# Patient Record
Sex: Female | Born: 1985 | Race: Black or African American | Hispanic: No | Marital: Single | Smoking: Current every day smoker
Health system: Southern US, Community
[De-identification: ages and names within clinical notes are randomized; demographics above are authoritative.]

## PROBLEM LIST (undated history)

## (undated) ENCOUNTER — Inpatient Hospital Stay: Payer: Self-pay

## (undated) DIAGNOSIS — C50919 Malignant neoplasm of unspecified site of unspecified female breast: Secondary | ICD-10-CM

## (undated) DIAGNOSIS — D649 Anemia, unspecified: Secondary | ICD-10-CM

## (undated) HISTORY — DX: Malignant neoplasm of unspecified site of unspecified female breast: C50.919

---

## 2005-04-12 ENCOUNTER — Emergency Department: Payer: Self-pay | Admitting: Emergency Medicine

## 2005-05-06 ENCOUNTER — Emergency Department: Payer: Self-pay | Admitting: Emergency Medicine

## 2008-04-09 ENCOUNTER — Ambulatory Visit: Payer: Self-pay | Admitting: Family Medicine

## 2008-09-18 ENCOUNTER — Emergency Department: Payer: Self-pay | Admitting: Emergency Medicine

## 2010-10-05 ENCOUNTER — Observation Stay: Payer: Self-pay

## 2010-10-10 ENCOUNTER — Ambulatory Visit: Payer: Self-pay | Admitting: Obstetrics and Gynecology

## 2010-10-11 ENCOUNTER — Inpatient Hospital Stay: Payer: Self-pay | Admitting: Obstetrics and Gynecology

## 2012-12-20 ENCOUNTER — Emergency Department: Payer: Self-pay | Admitting: Emergency Medicine

## 2014-05-20 ENCOUNTER — Ambulatory Visit: Payer: Self-pay | Admitting: Obstetrics and Gynecology

## 2014-05-21 ENCOUNTER — Inpatient Hospital Stay: Payer: Self-pay | Admitting: Obstetrics and Gynecology

## 2014-07-05 NOTE — Op Note (Signed)
PATIENT NAME:  [name], [name] MR#:  [id number] DATE OF BIRTH:  27-Aug-1985  DATE OF PROCEDURE:  05/21/2014  PREOPERATIVE DIAGNOSES:  1.  Intrauterine pregnancy at 22 weeks' gestational age.  2.  History of prior cesarean delivery, desires repeat.   POSTOPERATIVE DIAGNOSES:  1.  Intrauterine pregnancy at 4 weeks' gestational age.  2.  History of prior cesarean delivery, desires repeat.   PROCEDURE: Repeat low transverse cesarean section via Pfannenstiel incision.   ANESTHESIA: Spinal.   SURGEON: Will Bonnet, MD   ASSISTANT: Aletha Halim, MD   ESTIMATED BLOOD LOSS: 500 mL.   OPERATIVE FLUIDS: 1500 mL of crystalloid.   COMPLICATIONS: None.   FINDINGS: Adhesions of anterior abdominal wall to uterus and omentum (mild).   SPECIMENS: None.  CONDITION AT THE END OF THE PROCEDURE: Stable.   PROCEDURE IN DETAIL: The patient was taken to the operating room where spinal analgesia was administered and found to be adequate. The patient was placed in the dorsal supine position with a leftward tilt and prepped and draped in the usual sterile fashion. After a timeout was called, a Pfannenstiel incision was made and carried through the various layers until the peritoneum was identified and entered sharply. Upon opening the peritoneum, it was noted that there were adhesions of the left side of the uterus to the anterior abdominal wall along with mild adhesions of the omentum. The adhesions were taken down without difficulty. A low transverse hysterotomy was made with a scalpel and the hysterotomy was extended with cranial and caudal tension. The membranes were ruptured with a return of clear fluid. The fetal vertex was grasped and elevated to the hysterotomy. The head followed by the shoulders and the rest of the body were delivered using fundal pressure without difficulty. The cord was clamped and cut and the infant was handed to the pediatricians. The placenta delivered spontaneously  intact with a 3-vessel cord. The uterus was cleared of all clots and debris. The hysterotomy was closed using #0 Vicryl in a running locked fashion. A second layer of the same suture was used to obtain hemostasis. The uterus was returned to the abdomen and the gutters were cleared of all clots and debris. The peritoneum was closed using #0 Vicryl in a running fashion.   The On-Q catheters were placed according to the manufacturer's recommendations. They were placed approximately 4 cm cephalad to the incision line, approximately 1 cm apart, straddling the midline. They were inserted to a depth of approximately the fourth mark on the catheters, positioned just superficial to the rectus abdominis muscles and just deep to the rectus fascia.   The rectus fascia was closed using looped #1-0 PDS in a running fashion. Great care was taken not to include the On-Q catheters in the closure. The skin was closed using 4-0 Monocryl in a subcuticular fashion. The skin closure was reinforced using benzoin as well as Steri-Strips.   The On-Q catheters were affixed to the skin using Dermabond as well as Steri-Strips and Tegaderm. Each catheter was bolused with 5 mL of 0.5% Marcaine plain for a total of 10 mL.   The patient tolerated the procedure well. Sponge, lap, and needle counts were correct x 2. For VTE prophylaxis, the patient was wearing pneumatic compression stockings throughout the entire procedure. She received 2 g of Ancef for antibiotic prophylaxis prior to skin incision. She was taken to the recovery area in stable condition.   ____________________________ Will Bonnet, MD sdj:ST D: 05/21/2014 23:48:35 ET T:  05/22/2014 00:28:44 ET JOB#: [id number]  cc: Will Bonnet, MD, <Dictator> Will Bonnet MD ELECTRONICALLY SIGNED 06/10/2014 18:38

## 2016-01-17 ENCOUNTER — Observation Stay
Admission: EM | Admit: 2016-01-17 | Discharge: 2016-01-17 | Disposition: A | Discharge status: Home / Self Care | Payer: Self-pay | Attending: Obstetrics and Gynecology | Admitting: Obstetrics and Gynecology

## 2016-01-17 DIAGNOSIS — O0932 Supervision of pregnancy with insufficient antenatal care, second trimester: Secondary | ICD-10-CM | POA: Insufficient documentation

## 2016-01-17 DIAGNOSIS — M545 Low back pain: Secondary | ICD-10-CM | POA: Insufficient documentation

## 2016-01-17 DIAGNOSIS — M549 Dorsalgia, unspecified: Secondary | ICD-10-CM | POA: Diagnosis present

## 2016-01-17 DIAGNOSIS — O26892 Other specified pregnancy related conditions, second trimester: Principal | ICD-10-CM | POA: Insufficient documentation

## 2016-01-17 DIAGNOSIS — O34219 Maternal care for unspecified type scar from previous cesarean delivery: Secondary | ICD-10-CM | POA: Insufficient documentation

## 2016-01-17 DIAGNOSIS — Z3A21 21 weeks gestation of pregnancy: Secondary | ICD-10-CM | POA: Insufficient documentation

## 2016-01-17 HISTORY — DX: Anemia, unspecified: D64.9

## 2016-01-17 LAB — URINALYSIS COMPLETE WITH MICROSCOPIC (ARMC ONLY)
BILIRUBIN URINE: NEGATIVE
Glucose, UA: 50 mg/dL — AB
Hgb urine dipstick: NEGATIVE
NITRITE: NEGATIVE
PH: 6 (ref 5.0–8.0)
PROTEIN: 30 mg/dL — AB
SPECIFIC GRAVITY, URINE: 1.028 (ref 1.005–1.030)

## 2016-01-17 MED ORDER — ACETAMINOPHEN 500 MG PO TABS
1000.0000 mg | ORAL_TABLET | Freq: Once | ORAL | Status: AC
  Administered 2016-01-17: 1000 mg via ORAL

## 2016-01-17 MED ORDER — ACETAMINOPHEN 500 MG PO TABS
ORAL_TABLET | ORAL | Status: AC
  Administered 2016-01-17: 1000 mg via ORAL
  Filled 2016-01-17: qty 2

## 2016-01-17 NOTE — Final Progress Note (Signed)
L&D OB Triage Note  [name] is a 30 y.o. [id number] female at [redacted]w[redacted]d, EDD Estimated Date of Delivery: 05/23/16 with no prenatal care to date who presented to triage for complaints of low back pain and decreased fetal movement.  Patient with h/o prior C-section x 3. She was evaluated by the nurses with no significant findings. Vital signs stable. An NST was performed and has been reviewed by MD. She was treated with Tylenol ES.   NST INTERPRETATION: Indications: rule out uterine contractions   Fetal heart tones obtained (unable to perform complete NST due to gestational age and active fetal movement.  No contractions present.         Labs:  Results for orders placed or performed during the hospital encounter of 01/17/16  Urinalysis complete, with microscopic ([facility] only)  Result Value Ref Range   Color, Urine YELLOW (A) YELLOW   APPearance HAZY (A) CLEAR   Glucose, UA 50 (A) NEGATIVE mg/dL   Bilirubin Urine NEGATIVE NEGATIVE   Ketones, ur TRACE (A) NEGATIVE mg/dL   Specific Gravity, Urine 1.028 1.005 - 1.030   Hgb urine dipstick NEGATIVE NEGATIVE   pH 6.0 5.0 - 8.0   Protein, ur 30 (A) NEGATIVE mg/dL   Nitrite NEGATIVE NEGATIVE   Leukocytes, UA 2+ (A) NEGATIVE   RBC / HPF 0-5 0 - 5 RBC/hpf   WBC, UA 6-30 0 - 5 WBC/hpf   Bacteria, UA RARE (A) NONE SEEN   Squamous Epithelial / LPF 6-30 (A) NONE SEEN   Mucous PRESENT    Hyaline Casts, UA PRESENT     Plan: FHT obtained. She was discharged home, advised on adequate hydration, Tylenol prn, heating pad to lower back.  Advised patient to establish [facility] as soon as possible.    Rubie Maid, MD  [facility]

## 2016-01-17 NOTE — Discharge Instructions (Signed)
Call provider or return to birthplace with:  1. Regular contractions 2. Leaking of fluid from your vagina 3. Vaginal bleeding: Bright red or heavy like a period 4. Decreased Fetal movement  Make an appointment for prenatal care ASAP

## 2016-01-17 NOTE — OB Triage Note (Signed)
Patient is a G6P3 who states that she has been having back pain, nausea, and decreased fetal movement x 2 days.

## 2016-01-19 LAB — URINE CULTURE: Culture: NO GROWTH

## 2016-04-24 ENCOUNTER — Inpatient Hospital Stay: Payer: Medicaid Other | Admitting: Anesthesiology

## 2016-04-24 ENCOUNTER — Inpatient Hospital Stay
Admission: EM | Admit: 2016-04-24 | Discharge: 2016-04-26 | DRG: 765 | Disposition: A | Discharge status: Home / Self Care | Payer: Medicaid Other | Source: Ambulatory Visit | Attending: Obstetrics and Gynecology | Admitting: Obstetrics and Gynecology

## 2016-04-24 ENCOUNTER — Encounter: Payer: Self-pay | Admitting: Certified Nurse Midwife

## 2016-04-24 ENCOUNTER — Encounter
Admission: EM | Disposition: A | Discharge status: Home / Self Care | Payer: Self-pay | Source: Ambulatory Visit | Attending: Obstetrics and Gynecology

## 2016-04-24 DIAGNOSIS — O9902 Anemia complicating childbirth: Secondary | ICD-10-CM | POA: Diagnosis present

## 2016-04-24 DIAGNOSIS — O34211 Maternal care for low transverse scar from previous cesarean delivery: Secondary | ICD-10-CM | POA: Diagnosis present

## 2016-04-24 DIAGNOSIS — D649 Anemia, unspecified: Secondary | ICD-10-CM | POA: Diagnosis present

## 2016-04-24 DIAGNOSIS — A568 Sexually transmitted chlamydial infection of other sites: Secondary | ICD-10-CM | POA: Diagnosis present

## 2016-04-24 DIAGNOSIS — Z302 Encounter for sterilization: Secondary | ICD-10-CM | POA: Diagnosis not present

## 2016-04-24 DIAGNOSIS — O99334 Smoking (tobacco) complicating childbirth: Secondary | ICD-10-CM | POA: Diagnosis present

## 2016-04-24 DIAGNOSIS — O9832 Other infections with a predominantly sexual mode of transmission complicating childbirth: Secondary | ICD-10-CM | POA: Diagnosis present

## 2016-04-24 DIAGNOSIS — Z3A39 39 weeks gestation of pregnancy: Secondary | ICD-10-CM

## 2016-04-24 DIAGNOSIS — F17210 Nicotine dependence, cigarettes, uncomplicated: Secondary | ICD-10-CM | POA: Diagnosis present

## 2016-04-24 DIAGNOSIS — Z98891 History of uterine scar from previous surgery: Secondary | ICD-10-CM

## 2016-04-24 DIAGNOSIS — Z3493 Encounter for supervision of normal pregnancy, unspecified, third trimester: Secondary | ICD-10-CM | POA: Diagnosis present

## 2016-04-24 LAB — CBC
HCT: 30.9 % — ABNORMAL LOW (ref 35.0–47.0)
Hemoglobin: 10.2 g/dL — ABNORMAL LOW (ref 12.0–16.0)
MCH: 25.2 pg — ABNORMAL LOW (ref 26.0–34.0)
MCHC: 33.1 g/dL (ref 32.0–36.0)
MCV: 76.2 fL — ABNORMAL LOW (ref 80.0–100.0)
PLATELETS: 380 10*3/uL (ref 150–440)
RBC: 4.06 MIL/uL (ref 3.80–5.20)
RDW: 16.4 % — ABNORMAL HIGH (ref 11.5–14.5)
WBC: 12.9 10*3/uL — ABNORMAL HIGH (ref 3.6–11.0)

## 2016-04-24 LAB — TYPE AND SCREEN
ABO/RH(D): A POS
ANTIBODY SCREEN: NEGATIVE

## 2016-04-24 LAB — COMPREHENSIVE METABOLIC PANEL
ALT: 10 U/L — ABNORMAL LOW (ref 14–54)
ANION GAP: 8 (ref 5–15)
AST: 20 U/L (ref 15–41)
Albumin: 3 g/dL — ABNORMAL LOW (ref 3.5–5.0)
Alkaline Phosphatase: 217 U/L — ABNORMAL HIGH (ref 38–126)
BUN: 9 mg/dL (ref 6–20)
CO2: 20 mmol/L — AB (ref 22–32)
Calcium: 8.7 mg/dL — ABNORMAL LOW (ref 8.9–10.3)
Chloride: 107 mmol/L (ref 101–111)
Creatinine, Ser: 0.51 mg/dL (ref 0.44–1.00)
Glucose, Bld: 83 mg/dL (ref 65–99)
Potassium: 3.9 mmol/L (ref 3.5–5.1)
SODIUM: 135 mmol/L (ref 135–145)
Total Bilirubin: 0.6 mg/dL (ref 0.3–1.2)
Total Protein: 7 g/dL (ref 6.5–8.1)

## 2016-04-24 LAB — RAPID HIV SCREEN (HIV 1/2 AB+AG)
HIV 1/2 Antibodies: NONREACTIVE
HIV-1 P24 Antigen - HIV24: NONREACTIVE

## 2016-04-24 LAB — URINE DRUG SCREEN, QUALITATIVE (ARMC ONLY)
AMPHETAMINES, UR SCREEN: NOT DETECTED
BENZODIAZEPINE, UR SCRN: NOT DETECTED
Barbiturates, Ur Screen: NOT DETECTED
COCAINE METABOLITE, UR ~~LOC~~: NOT DETECTED
Cannabinoid 50 Ng, Ur ~~LOC~~: NOT DETECTED
MDMA (Ecstasy)Ur Screen: NOT DETECTED
METHADONE SCREEN, URINE: NOT DETECTED
OPIATE, UR SCREEN: NOT DETECTED
Phencyclidine (PCP) Ur S: NOT DETECTED
TRICYCLIC, UR SCREEN: NOT DETECTED

## 2016-04-24 LAB — CHLAMYDIA/NGC RT PCR (ARMC ONLY)
Chlamydia Tr: DETECTED — AB
N GONORRHOEAE: NOT DETECTED

## 2016-04-24 SURGERY — Surgical Case
Anesthesia: Spinal | Site: Abdomen | Wound class: Clean Contaminated

## 2016-04-24 MED ORDER — DIPHENHYDRAMINE HCL 50 MG/ML IJ SOLN
12.5000 mg | INTRAMUSCULAR | Status: DC | PRN
  Administered 2016-04-24: 12.5 mg via INTRAVENOUS
  Filled 2016-04-24: qty 1

## 2016-04-24 MED ORDER — NALBUPHINE HCL 10 MG/ML IJ SOLN: 5.0000 mg | INTRAMUSCULAR | Status: DC | PRN

## 2016-04-24 MED ORDER — NALOXONE HCL 0.4 MG/ML IJ SOLN: 0.4000 mg | INTRAMUSCULAR | Status: DC | PRN

## 2016-04-24 MED ORDER — TETANUS-DIPHTH-ACELL PERTUSSIS 5-2.5-18.5 LF-MCG/0.5 IM SUSP: 0.5000 mL | Freq: Once | INTRAMUSCULAR | Status: DC

## 2016-04-24 MED ORDER — ONDANSETRON HCL 4 MG/2ML IJ SOLN: 4.0000 mg | Freq: Once | INTRAMUSCULAR | Status: DC | PRN

## 2016-04-24 MED ORDER — DIBUCAINE 1 % RE OINT: 1.0000 "application " | TOPICAL_OINTMENT | RECTAL | Status: DC | PRN

## 2016-04-24 MED ORDER — ACETAMINOPHEN 325 MG PO TABS: 650.0000 mg | ORAL_TABLET | ORAL | Status: DC | PRN

## 2016-04-24 MED ORDER — KETOROLAC TROMETHAMINE 30 MG/ML IJ SOLN
30.0000 mg | Freq: Four times a day (QID) | INTRAMUSCULAR | Status: DC
  Administered 2016-04-24: 30 mg via INTRAVENOUS
  Filled 2016-04-24: qty 1

## 2016-04-24 MED ORDER — SOD CITRATE-CITRIC ACID 500-334 MG/5ML PO SOLN
30.0000 mL | ORAL | Status: AC
  Administered 2016-04-24: 30 mL via ORAL
  Filled 2016-04-24: qty 15

## 2016-04-24 MED ORDER — KETOROLAC TROMETHAMINE 30 MG/ML IJ SOLN: 30.0000 mg | Freq: Four times a day (QID) | INTRAMUSCULAR | Status: DC

## 2016-04-24 MED ORDER — SENNOSIDES-DOCUSATE SODIUM 8.6-50 MG PO TABS
2.0000 | ORAL_TABLET | ORAL | Status: DC
  Administered 2016-04-25 – 2016-04-26 (×2): 2 via ORAL
  Filled 2016-04-24 (×2): qty 2

## 2016-04-24 MED ORDER — MEASLES, MUMPS & RUBELLA VAC ~~LOC~~ INJ
0.5000 mL | INJECTION | Freq: Once | SUBCUTANEOUS | Status: DC
  Filled 2016-04-24: qty 0.5

## 2016-04-24 MED ORDER — IBUPROFEN 800 MG PO TABS
800.0000 mg | ORAL_TABLET | Freq: Three times a day (TID) | ORAL | Status: DC
  Administered 2016-04-25 – 2016-04-26 (×3): 800 mg via ORAL
  Filled 2016-04-24 (×3): qty 1

## 2016-04-24 MED ORDER — DIPHENHYDRAMINE HCL 50 MG/ML IJ SOLN: 12.5000 mg | INTRAMUSCULAR | Status: DC | PRN

## 2016-04-24 MED ORDER — SIMETHICONE 80 MG PO CHEW
80.0000 mg | CHEWABLE_TABLET | ORAL | Status: DC
  Administered 2016-04-25 – 2016-04-26 (×2): 80 mg via ORAL
  Filled 2016-04-24 (×2): qty 1

## 2016-04-24 MED ORDER — LACTATED RINGERS IV SOLN
500.0000 mL | INTRAVENOUS | Status: DC | PRN
  Administered 2016-04-24: 1000 mL via INTRAVENOUS

## 2016-04-24 MED ORDER — MORPHINE SULFATE (PF) 2 MG/ML IV SOLN: 2.0000 mg | INTRAVENOUS | Status: DC | PRN

## 2016-04-24 MED ORDER — DEXTROSE 5 % IV SOLN
500.0000 mg | INTRAVENOUS | Status: DC
  Administered 2016-04-24: 500 mg via INTRAVENOUS
  Filled 2016-04-24: qty 500

## 2016-04-24 MED ORDER — CEFAZOLIN SODIUM-DEXTROSE 2-3 GM-% IV SOLR
2.0000 g | Freq: Once | INTRAVENOUS | Status: AC
  Administered 2016-04-24: 2 g via INTRAVENOUS
  Filled 2016-04-24: qty 50

## 2016-04-24 MED ORDER — SIMETHICONE 80 MG PO CHEW: 80.0000 mg | CHEWABLE_TABLET | ORAL | Status: DC | PRN

## 2016-04-24 MED ORDER — OXYTOCIN BOLUS FROM INFUSION: 500.0000 mL | Freq: Once | INTRAVENOUS | Status: DC

## 2016-04-24 MED ORDER — LIDOCAINE 5 % EX PTCH
1.0000 | MEDICATED_PATCH | CUTANEOUS | Status: DC
  Filled 2016-04-24: qty 1

## 2016-04-24 MED ORDER — CEFAZOLIN SODIUM-DEXTROSE 2-4 GM/100ML-% IV SOLN: 2.0000 g | Freq: Once | INTRAVENOUS | Status: DC

## 2016-04-24 MED ORDER — OXYTOCIN 40 UNITS IN LACTATED RINGERS INFUSION - SIMPLE MED
2.5000 [IU]/h | INTRAVENOUS | Status: DC
  Administered 2016-04-24: 4 mL via INTRAVENOUS
  Administered 2016-04-24: 100 mL via INTRAVENOUS
  Administered 2016-04-24: 996 mL via INTRAVENOUS
  Filled 2016-04-24: qty 1000

## 2016-04-24 MED ORDER — BUPIVACAINE IN DEXTROSE 0.75-8.25 % IT SOLN
INTRATHECAL | Status: DC | PRN
  Administered 2016-04-24: 1.8 mL via INTRATHECAL

## 2016-04-24 MED ORDER — WITCH HAZEL-GLYCERIN EX PADS: 1.0000 "application " | MEDICATED_PAD | CUTANEOUS | Status: DC | PRN

## 2016-04-24 MED ORDER — BUPIVACAINE 0.25 % ON-Q PUMP DUAL CATH 400 ML
400.0000 mL | INJECTION | Status: DC
  Filled 2016-04-24: qty 400

## 2016-04-24 MED ORDER — FERROUS SULFATE 325 (65 FE) MG PO TABS
325.0000 mg | ORAL_TABLET | Freq: Two times a day (BID) | ORAL | Status: DC
  Administered 2016-04-25 – 2016-04-26 (×4): 325 mg via ORAL
  Filled 2016-04-24 (×4): qty 1

## 2016-04-24 MED ORDER — LACTATED RINGERS IV SOLN: INTRAVENOUS | Status: DC

## 2016-04-24 MED ORDER — ONDANSETRON HCL 4 MG/2ML IJ SOLN: 4.0000 mg | Freq: Three times a day (TID) | INTRAMUSCULAR | Status: DC | PRN

## 2016-04-24 MED ORDER — NALBUPHINE HCL 10 MG/ML IJ SOLN: 5.0000 mg | Freq: Once | INTRAMUSCULAR | Status: DC | PRN

## 2016-04-24 MED ORDER — NALOXONE HCL 2 MG/2ML IJ SOSY: 1.0000 ug/kg/h | PREFILLED_SYRINGE | INTRAVENOUS | Status: DC | PRN

## 2016-04-24 MED ORDER — SIMETHICONE 80 MG PO CHEW
80.0000 mg | CHEWABLE_TABLET | Freq: Three times a day (TID) | ORAL | Status: DC
  Administered 2016-04-25 – 2016-04-26 (×6): 80 mg via ORAL
  Filled 2016-04-24 (×6): qty 1

## 2016-04-24 MED ORDER — MENTHOL 3 MG MT LOZG
1.0000 | LOZENGE | OROMUCOSAL | Status: DC | PRN
  Filled 2016-04-24: qty 9

## 2016-04-24 MED ORDER — SODIUM CHLORIDE 0.9% FLUSH: 3.0000 mL | INTRAVENOUS | Status: DC | PRN

## 2016-04-24 MED ORDER — OXYCODONE HCL 5 MG PO TABS: 10.0000 mg | ORAL_TABLET | ORAL | Status: DC | PRN

## 2016-04-24 MED ORDER — LIDOCAINE 5 % EX PTCH
1.0000 | MEDICATED_PATCH | CUTANEOUS | Status: DC
  Administered 2016-04-25: 1 via TRANSDERMAL

## 2016-04-24 MED ORDER — OXYCODONE HCL 5 MG PO TABS: 5.0000 mg | ORAL_TABLET | ORAL | Status: DC | PRN

## 2016-04-24 MED ORDER — COCONUT OIL OIL: 1.0000 "application " | TOPICAL_OIL | Status: DC | PRN

## 2016-04-24 MED ORDER — MORPHINE SULFATE (PF) 0.5 MG/ML IJ SOLN
INTRAMUSCULAR | Status: AC
  Filled 2016-04-24: qty 10

## 2016-04-24 MED ORDER — DIPHENHYDRAMINE HCL 25 MG PO CAPS: 25.0000 mg | ORAL_CAPSULE | Freq: Four times a day (QID) | ORAL | Status: DC | PRN

## 2016-04-24 MED ORDER — FENTANYL CITRATE (PF) 100 MCG/2ML IJ SOLN
INTRAMUSCULAR | Status: DC | PRN
  Administered 2016-04-24: 15 ug via INTRAVENOUS

## 2016-04-24 MED ORDER — SCOPOLAMINE 1 MG/3DAYS TD PT72: 1.0000 | MEDICATED_PATCH | Freq: Once | TRANSDERMAL | Status: DC

## 2016-04-24 MED ORDER — IBUPROFEN 600 MG PO TABS: 600.0000 mg | ORAL_TABLET | Freq: Four times a day (QID) | ORAL | Status: DC | PRN

## 2016-04-24 MED ORDER — MEPERIDINE HCL 25 MG/ML IJ SOLN: 6.2500 mg | INTRAMUSCULAR | Status: DC | PRN

## 2016-04-24 MED ORDER — OXYCODONE-ACETAMINOPHEN 5-325 MG PO TABS: 2.0000 | ORAL_TABLET | ORAL | Status: DC | PRN

## 2016-04-24 MED ORDER — LACTATED RINGERS IV SOLN
INTRAVENOUS | Status: DC
  Administered 2016-04-24: 15:00:00 via INTRAVENOUS

## 2016-04-24 MED ORDER — FENTANYL CITRATE (PF) 100 MCG/2ML IJ SOLN: 25.0000 ug | INTRAMUSCULAR | Status: DC | PRN

## 2016-04-24 MED ORDER — MORPHINE SULFATE (PF) 0.5 MG/ML IJ SOLN
INTRAMUSCULAR | Status: DC | PRN
  Administered 2016-04-24: .2 mg via EPIDURAL

## 2016-04-24 MED ORDER — LIDOCAINE 5 % EX PTCH
MEDICATED_PATCH | CUTANEOUS | Status: DC | PRN
  Administered 2016-04-24: 1 via TRANSDERMAL

## 2016-04-24 MED ORDER — TERBUTALINE SULFATE 1 MG/ML IJ SOLN
INTRAMUSCULAR | Status: AC
  Filled 2016-04-24: qty 1

## 2016-04-24 MED ORDER — LIDOCAINE 5 % EX PTCH
MEDICATED_PATCH | CUTANEOUS | Status: AC
  Filled 2016-04-24: qty 1

## 2016-04-24 MED ORDER — DIPHENHYDRAMINE HCL 25 MG PO CAPS: 25.0000 mg | ORAL_CAPSULE | ORAL | Status: DC | PRN

## 2016-04-24 MED ORDER — BUPIVACAINE HCL (PF) 0.5 % IJ SOLN: 10.0000 mL | Freq: Once | INTRAMUSCULAR | Status: DC

## 2016-04-24 MED ORDER — FENTANYL CITRATE (PF) 100 MCG/2ML IJ SOLN
INTRAMUSCULAR | Status: AC
  Filled 2016-04-24: qty 2

## 2016-04-24 MED ORDER — OXYTOCIN 10 UNIT/ML IJ SOLN
INTRAMUSCULAR | Status: AC
  Filled 2016-04-24: qty 4

## 2016-04-24 MED ORDER — ONDANSETRON HCL 4 MG/2ML IJ SOLN
4.0000 mg | Freq: Four times a day (QID) | INTRAMUSCULAR | Status: DC | PRN
  Administered 2016-04-24: 4 mg via INTRAVENOUS

## 2016-04-24 MED ORDER — PRENATAL MULTIVITAMIN CH
1.0000 | ORAL_TABLET | Freq: Every day | ORAL | Status: DC
  Administered 2016-04-25 – 2016-04-26 (×2): 1 via ORAL
  Filled 2016-04-24 (×2): qty 1

## 2016-04-24 MED ORDER — EPINEPHRINE PF 1 MG/ML IJ SOLN
INTRAMUSCULAR | Status: AC
  Filled 2016-04-24: qty 1

## 2016-04-24 MED ORDER — ONDANSETRON HCL 4 MG/2ML IJ SOLN
INTRAMUSCULAR | Status: AC
  Administered 2016-04-24: 4 mg via INTRAVENOUS
  Filled 2016-04-24: qty 2

## 2016-04-24 MED ORDER — NALBUPHINE HCL 10 MG/ML IJ SOLN
5.0000 mg | INTRAMUSCULAR | Status: DC | PRN
Start: 2016-04-24 — End: 2016-04-24

## 2016-04-24 MED ORDER — OXYTOCIN 40 UNITS IN LACTATED RINGERS INFUSION - SIMPLE MED
2.5000 [IU]/h | INTRAVENOUS | Status: AC
  Filled 2016-04-24: qty 1000

## 2016-04-24 MED ORDER — ACETAMINOPHEN 325 MG PO TABS
650.0000 mg | ORAL_TABLET | Freq: Four times a day (QID) | ORAL | Status: AC
  Administered 2016-04-25 (×4): 650 mg via ORAL
  Filled 2016-04-24 (×4): qty 2

## 2016-04-24 MED ORDER — OXYCODONE-ACETAMINOPHEN 5-325 MG PO TABS
1.0000 | ORAL_TABLET | ORAL | Status: DC | PRN
  Administered 2016-04-26: 1 via ORAL
  Filled 2016-04-24: qty 1

## 2016-04-24 SURGICAL SUPPLY — 30 items
CANISTER SUCT 3000ML (MISCELLANEOUS) ×3 IMPLANT
CATH KIT ON-Q SILVERSOAK 5IN (CATHETERS) ×6 IMPLANT
CLOSURE WOUND 1/2 X4 (GAUZE/BANDAGES/DRESSINGS) ×1
DRSG OPSITE POSTOP 4X10 (GAUZE/BANDAGES/DRESSINGS) ×3 IMPLANT
DRSG TELFA 3X8 NADH (GAUZE/BANDAGES/DRESSINGS) ×3 IMPLANT
ELECT CAUTERY BLADE 6.4 (BLADE) ×3 IMPLANT
ELECT REM PT RETURN 9FT ADLT (ELECTROSURGICAL) ×3
ELECTRODE REM PT RTRN 9FT ADLT (ELECTROSURGICAL) ×1 IMPLANT
GAUZE SPONGE 4X4 12PLY STRL (GAUZE/BANDAGES/DRESSINGS) ×3 IMPLANT
GLOVE BIO SURGEON STRL SZ7 (GLOVE) ×3 IMPLANT
GLOVE INDICATOR 7.5 STRL GRN (GLOVE) ×3 IMPLANT
GOWN STRL REUS W/ TWL LRG LVL3 (GOWN DISPOSABLE) ×3 IMPLANT
GOWN STRL REUS W/TWL LRG LVL3 (GOWN DISPOSABLE) ×6
LIQUID BAND (GAUZE/BANDAGES/DRESSINGS) ×3 IMPLANT
NS IRRIG 1000ML POUR BTL (IV SOLUTION) ×3 IMPLANT
PACK C SECTION AR (MISCELLANEOUS) ×3 IMPLANT
PAD OB MATERNITY 4.3X12.25 (PERSONAL CARE ITEMS) ×6 IMPLANT
PAD PREP 24X41 OB/GYN DISP (PERSONAL CARE ITEMS) ×3 IMPLANT
SPONGE LAP 18X18 5 PK (GAUZE/BANDAGES/DRESSINGS) ×6 IMPLANT
STRIP CLOSURE SKIN 1/2X4 (GAUZE/BANDAGES/DRESSINGS) ×2 IMPLANT
SUT CHROMIC GUT BROWN 0 54 (SUTURE) ×1 IMPLANT
SUT CHROMIC GUT BROWN 0 54IN (SUTURE) ×3
SUT MNCRL 4-0 (SUTURE) ×2
SUT MNCRL 4-0 27XMFL (SUTURE) ×1
SUT PDS AB 1 TP1 96 (SUTURE) ×3 IMPLANT
SUT PLAIN 2 0 XLH (SUTURE) ×3 IMPLANT
SUT PLAIN GUT 0 (SUTURE) ×6 IMPLANT
SUT VIC AB 0 CT1 36 (SUTURE) ×12 IMPLANT
SUTURE MNCRL 4-0 27XMF (SUTURE) ×1 IMPLANT
SWABSTK COMLB BENZOIN TINCTURE (MISCELLANEOUS) ×3 IMPLANT

## 2016-04-24 NOTE — Procedures (Signed)
[name] PROCEDURE DATE: 04/24/2016  PREOPERATIVE DIAGNOSES: Intrauterine pregnancy at [redacted]w[redacted]d weeks gestation; Prior C-section 3; desires elective sterilization; no prenatal care  POSTOPERATIVE DIAGNOSES: The same and viable female infant 8 lbs. 9 oz.  PROCEDURE: Repeat Low Transverse Cesarean Section; Bilateral Partial Salpingectomy  SURGEON:  Hassell Done A. Zipporah Plants, MD, FACOG  ASSISTANT:   Dr. Meda Coffee  ANESTHESIOLOGIST: Dr. Ronelle Nigh  INDICATIONS: [name] is a 31 y.o. [phone] at [redacted]w[redacted]d here for cesarean section secondary to the indications listed under preoperative diagnoses; please see preoperative note for further details.  The risks of cesarean section were discussed with the patient including but were not limited to: bleeding which may require transfusion or reoperation; infection which may require antibiotics; injury to bowel, bladder, ureters or other surrounding organs; injury to the fetus; need for additional procedures including hysterectomy in the event of a life-threatening hemorrhage; placental abnormalities with subsequent pregnancies, incisional problems, thromboembolic phenomenon and other postoperative/anesthesia complications.   The patient concurred with the proposed plan, giving informed written consent for the procedure.    FINDINGS:  Viable female infant in cephalic presentation.  Apgars 8 and 9.  Meconium stained amniotic fluid.  Intact placenta, three vessel cord.  Normal uterus, fallopian tubes and ovaries bilaterally. Moderate pelvic adhesive disease  ANESTHESIA: Spinal INTRAVENOUS FLUIDS: Per anesthesia flow sheet ESTIMATED BLOOD LOSS: 500 ml URINE OUTPUT:  Per anesthesia flow sheet ml ANTIBIOTIC PROPHYLAXIS: Ancef 2 grams SPECIMENS: Fallopian Tube Segments Cord Gas: N/A COMPLICATIONS: None immediate  DESCRIPTION OF PROCEDURE: Patient was brought to the operating room where she was placed in the supine position. Spinal  anesthetic was introduced without difficulty. She was placed in the supine position with a right lateral hip roll in place.Foley catheter was inserted and was draining clear yellow urine. The abdomen was prepped with ChloraPrep solution and draped in a sterile manner. After timeout and checking for adequate level of anesthesia the procedure was started. Pfannenstiel incision was made into the abdomen. The prior skin scar was excised. The fascia was incised transversely and extended bilaterally with Mayo scissors. The rectus muscle was dissected off the fascia through sharp and blunt dissection. The midline raphae was identified and separated and the peritoneum was entered. Bladder flap was not created over lower uterine segment. A low transverse incision was made in the uterus above the bladder, and this was extended cephalad and caudad in standard fashion. Amniotic fluid was stained with moderate meconium. The infant was delivered through vertex presentation and was noted to be active at birth. The umbilical cord was doubly clamped and cut and the infant was handed off to the resuscitating team. Cord blood sampling was  obtained. Placenta was expressed from the uterus. Uterus was externalized onto the anterior abdominal wall and was cleared of all debris with laps. The incision was closed in one layer using #1 chromic suture in a running locking manner. Bilateral partial salpingectomy was then performed in routine fashion. Fallopian tube was grasped with a Babcock clamp. Mid segment of the fallopian tube was tied off with two 0 plain sutures. The first tie was a free tie. A second stick tie was placed as well. The intervening tube segment was resected. Similar procedure was carried out on the contralateral tube. Good hemostasis was noted. The uterus was then placed back into the abdominal pelvic cavity. Gutters were cleared of all debris with laps. The incision was then closed in layers with 0 Maxon being used on  the fascia  in a simple running manner. Subcutaneous tissues were re approximated with 2-0 Vicryl suture. The skin was closed with staples. Steri-Strips were placed. A Lidoderm patch was placed for analgesia. The patient was then mobilized and taken to the recovery room in satisfactory condition.  Estimated blood loss: 500 mL IV fluids: Per anesthesia mL Urine output: Per anesthesia mL. Antibiotic Prophylaxis: Ancef 2 grams; Zithromax 1 g  All instruments and needles and sponge counts were verified as correct. The patient did receive Zithromax 1 g and Ancef 2 gm antibiotic prophylaxis.   Felecia Stanfill A. Zipporah Plants, MD, [place]

## 2016-04-24 NOTE — Progress Notes (Signed)
Dr Enzo Bi notified by Dr Glennon Mac and Evert Kohl, CNM that patient belongs to him due to Dr Marcelline Mates see pt earlier this pregnancy and they are handing care over to him. Report given.

## 2016-04-24 NOTE — Anesthesia Preprocedure Evaluation (Signed)
Anesthesia Evaluation  Patient identified by MRN, date of birth, ID band Patient awake    Reviewed: Allergy & Precautions, NPO status , Patient's Chart, lab work & pertinent test results  History of Anesthesia Complications Negative for: history of anesthetic complications  Airway Mallampati: I  TM Distance: >3 FB Neck ROM: Full    Dental no notable dental hx.    Pulmonary neg sleep apnea, neg COPD, Current Smoker,    breath sounds clear to auscultation- rhonchi (-) wheezing      Cardiovascular Exercise Tolerance: Good (-) hypertension(-) CAD and (-) Past MI  Rhythm:Regular Rate:Normal - Systolic murmurs and - Diastolic murmurs    Neuro/Psych negative neurological ROS  negative psych ROS   GI/Hepatic negative GI ROS, Neg liver ROS,   Endo/Other  negative endocrine ROSneg diabetes  Renal/GU negative Renal ROS     Musculoskeletal negative musculoskeletal ROS (+)   Abdominal (+) - obese, Gravid abdomen  Peds  Hematology  (+) anemia ,   Anesthesia Other Findings   Reproductive/Obstetrics (+) Pregnancy                             Anesthesia Physical Anesthesia Plan  ASA: II  Anesthesia Plan: Spinal   Post-op Pain Management:    Induction:   Airway Management Planned: Natural Airway  Additional Equipment:   Intra-op Plan:   Post-operative Plan:   Informed Consent: I have reviewed the patients History and Physical, chart, labs and discussed the procedure including the risks, benefits and alternatives for the proposed anesthesia with the patient or authorized representative who has indicated his/her understanding and acceptance.   Dental advisory given  Plan Discussed with: Anesthesiologist and CRNA  Anesthesia Plan Comments:         Lab Results  Component Value Date   WBC 12.9 (H) 04/24/2016   HGB 10.2 (L) 04/24/2016   HCT 30.9 (L) 04/24/2016   MCV 76.2 (L)  04/24/2016   PLT 380 04/24/2016    Anesthesia Quick Evaluation

## 2016-04-24 NOTE — Transfer of Care (Signed)
Immediate Anesthesia Transfer of Care Note  Patient: [name]  Procedure(s) Performed: Procedure(s) with comments: [place] (N/A) - Baby Boy born @ 56 Apgars:8/9 Weight: 8lb 9oz  Patient Location: PACU  Anesthesia Type:Spinal  Level of Consciousness: awake, alert , oriented and patient cooperative  Airway & Oxygen Therapy: Patient Spontanous Breathing  Post-op Assessment: Report given to RN and Post -op Vital signs reviewed and stable  Post vital signs: Reviewed and stable  Last Vitals:  Vitals:   04/24/16 1322  BP: 139/73  Pulse: 87  Temp: 37.1 C    Last Pain:  Vitals:   04/24/16 1430  PainSc: 7          Complications: No apparent anesthesia complications

## 2016-04-24 NOTE — Progress Notes (Signed)
PRE OP C/S COUNSELING NOTE:  31 yo Female [id number], no prenatal care, prior C/S x 3, presents in labor with contractions, cervical change, and bloody show. By LMP pt is 35.[redacted] weeks gestation; by U/S EFW 8#10 with EGA 39.5 weeks. UDS negative. Pt desires permanent sterilization.  The patient has been counseled regarding indications for Cesarean Section delivery. (See intra partum notes for details). She is understanding of the planned procedure and is aware of and accepting of all surgical risks which include but are not limited to: Bleeding with possible need for blood product transfusions; Infection with possible need for additional antibiotic therapy; Blood Clot Disorders with possible need for blood thinners; Pelvic organ Injury with possible need for repair (Bowel/Bladder/Ureters/Ovaries/Tubes/etc); Anesthesia risks; Fetal Injury; and additional Post Op complications including incision dehiscence and possible future pregnancy placenta implantation issues. All questions have been answered. Informed consent is given.  BPS CONSENT: The patient is also desiring Permanent Sterilization. She understands that the procedure is NOT reversible and that there is a 1/300 failure rate. She is accepting of this and desires to also proceed with BPS.   Martin A.Zipporah Plants, MD, FACOG [facility]

## 2016-04-24 NOTE — H&P (Signed)
OB History & Physical   History of Present Illness:  Chief Complaint:  Presents vis EMS with contractions every 5 minutes since 0400 this AM HPI:  [name] is a 31 y.o. [phone] female with no prenatal care who stated her LMP was 08/17/2015 giving her an EDC=05/23/2016 and an EGA of [redacted]wk5d.  She denies any major illnesses this pregnancy. Has been taking PNV and iron.  She presents to L&D for evaluation of labor. She denies bleeding. Has had a mucoid discharge. Baby active. Has been nauseous, has not eaten anything since yesterday. Has tried drinking fluids, but can't keep them down.   Prenatal care site: No prenatal care.        Maternal Medical History:   Past Medical History:  Diagnosis Date  . Anemia     Past Surgical History:  Procedure Laterality Date  . CESAREAN SECTION      No Known Allergies  Prior to Admission medications   Medication Sig Start Date End Date Taking? Authorizing Provider  Prenatal Vit-Fe Fumarate-FA (MULTIVITAMIN-PRENATAL) 27-0.8 MG TABS tablet Take 1 tablet by mouth daily at 12 noon.   Yes Historical Provider, MD  ferrous sulfate 325 (65 FE) MG EC tablet Take 325 mg by mouth daily.    Historical Provider, MD          Social History: She  reports that she has been smoking Cigarettes.  She has a 1.25 pack-year smoking history. She has never used smokeless tobacco. She reports that she does not drink alcohol or use drugs.  Family History: family history includes Cancer in her maternal grandmother; Diabetes in her maternal grandfather; Heart disease in her maternal grandfather; Hypertension in her maternal grandfather and mother; Stroke in her father.   Review of Systems: Negative x 10 systems reviewed except as noted in the HPI.      Physical Exam:  Vital Signs: LMP 08/17/2015  General: in mild distress with contractions, grimacing with contractions HEENT: normocephalic, atraumatic Heart: regular rate & rhythm.  No murmurs Lungs:  clear to auscultation bilaterally Abdomen: soft, gravid, non-tender;  EFW: 8#10oz on ultrasound Pelvic:   External: Normal external female genitalia  Cervix: Dilation: (P) 3 / Effacement (%): (P) 80 / Station: (P) -1     ROM: negative Nitrazine Extremities: non-tender, symmetric, no edema bilaterally.  DTRs: +2  Neurologic: Alert & oriented x 3.    Pertinent Results:  Prenatal Labs: Blood type/Rh A positive from prior record  Antibody screen   Rubella Varicella Immune from prior record immune  RPR   HBsAg   HIV   GC   Chlamydia   Genetic screening   1 hour GTT   3 hour GTT   GBS     Baseline FHR: 120s with accelerations to 150s, moderate variability. Had some variable decelerations with contractions, initially that resolved with position change Toco: contractions q3-5 min apart Bedside Ultrasound: CGA 39wk5 days with EFW 8#10oz. Cephalic presentation/ fundal placenta   Assessment:  [name] is a 31 y.o. [phone] female at [redacted]w[redacted]d with three prior cesarean sections in labor. No prenatal care  Plan:  1. Admit to Labor & Delivery - notify attending & anesthesia-prepare for repeat Cesarean section. Patient advised that she is not a candidate for VBAC after three Cesarean sections.  2. CBC, T&S, stat and prenatal labs 3. IVF, NPO.   4. Consents obtained after explaining risks of repeat cesarean section including risks of bleeding, injury to other organs including bowel, bladder or other  blood vessels and the risks of anesthesia. Consent for blood transfusion also signed after discussion. 5. Azithromycin IV and Ancef ordered for surgical PPX.   Ridley Dileo, CNM  [name]  04/24/2016 2:08 PM

## 2016-04-24 NOTE — Anesthesia Post-op Follow-up Note (Cosign Needed)
Anesthesia QCDR form completed.        

## 2016-04-25 ENCOUNTER — Encounter: Payer: Self-pay | Admitting: Obstetrics and Gynecology

## 2016-04-25 LAB — CBC
HCT: 26.3 % — ABNORMAL LOW (ref 35.0–47.0)
HEMOGLOBIN: 8.7 g/dL — AB (ref 12.0–16.0)
MCH: 25.4 pg — ABNORMAL LOW (ref 26.0–34.0)
MCHC: 33.2 g/dL (ref 32.0–36.0)
MCV: 76.5 fL — ABNORMAL LOW (ref 80.0–100.0)
Platelets: 311 10*3/uL (ref 150–440)
RBC: 3.44 MIL/uL — AB (ref 3.80–5.20)
RDW: 16.4 % — ABNORMAL HIGH (ref 11.5–14.5)
WBC: 10 10*3/uL (ref 3.6–11.0)

## 2016-04-25 LAB — HEPATITIS B SURFACE ANTIGEN: Hepatitis B Surface Ag: NEGATIVE

## 2016-04-25 LAB — RPR: RPR Ser Ql: NONREACTIVE

## 2016-04-25 MED ORDER — KETOROLAC TROMETHAMINE 30 MG/ML IJ SOLN: 30.0000 mg | Freq: Four times a day (QID) | INTRAMUSCULAR | Status: AC

## 2016-04-25 MED ORDER — KETOROLAC TROMETHAMINE 30 MG/ML IJ SOLN
30.0000 mg | Freq: Four times a day (QID) | INTRAMUSCULAR | Status: AC
  Administered 2016-04-25 (×2): 30 mg via INTRAVENOUS
  Filled 2016-04-25 (×2): qty 1

## 2016-04-25 NOTE — Progress Notes (Signed)
Abdomen soft and distended

## 2016-04-25 NOTE — Progress Notes (Signed)
Subjective: Postpartum Day 1: Cesarean Delivery-Repeat; No prenatal care; Chlamydia infection Patient reports incisional pain, tolerating PO and no problems voiding.    Objective: Vital signs in last 24 hours: Temp:  [97.6 F (36.4 C)-98.7 F (37.1 C)] 98.2 F (36.8 C) (02/20 0402) Pulse Rate:  [52-98] 78 (02/20 0402) Resp:  [11-27] 12 (02/20 0402) BP: (102-156)/(57-89) 111/72 (02/20 0402) SpO2:  [98 %-100 %] 98 % (02/20 0402) Weight:  [120 lb (54.4 kg)] 120 lb (54.4 kg) (02/19 2214)  Physical Exam:  General: alert, cooperative and no distress Lochia: appropriate Uterine Fundus: firm Incision: healing well, no significant drainage, no dehiscence, no significant erythema DVT Evaluation: No evidence of DVT seen on physical exam.   Recent Labs  04/24/16 1411  HGB 10.2*  HCT 30.9*    Assessment/Plan: Status post Cesarean section. Doing well postoperatively.  Continue current care-advance diet; po pain meds; d/c foley;ambulate.  Hassell Done A Basil Buffin 04/25/2016, 7:00 AM

## 2016-04-25 NOTE — Anesthesia Post-op Follow-up Note (Signed)
  Anesthesia Pain Follow-up Note  Patient: [name]  Day #: 1  Date of Follow-up: 04/25/2016 Time: 7:15 AM  Last Vitals:  Vitals:   04/25/16 0052 04/25/16 0402  BP: 123/76 111/72  Pulse: 83 78  Resp: 12 12  Temp: 36.8 C 36.8 C    Level of Consciousness: alert  Pain: none   Side Effects:None  Catheter Site Exam:clean, dry, no drainage     Plan: D/C from anesthesia care at surgeon's request  Hedda Slade

## 2016-04-25 NOTE — Clinical Social Work Maternal (Signed)
  CLINICAL SOCIAL WORK MATERNAL/CHILD NOTE  Patient Details  Name: [name] MRN: [id number] Date of Birth: 1985-10-21  Date:  04/25/2016  Clinical Social Worker Initiating Note:  Shela Leff MSW,LCSW Date/ Time Initiated:  04/25/16/1000     Child's Name:      Legal Guardian:  Mother   Need for Interpreter:  None   Date of Referral:        Reason for Referral:  Late or No Prenatal Care    Referral Source:      Address:     Phone number:      Household Members:  Minor Children   Natural Supports (not living in the home):      Professional Supports:     Employment:     Type of Work:     Education:      Pensions consultant:  Self-Pay    Other Resources:  [employer]   Cultural/Religious Considerations Which May Impact Care:  none  Strengths:  Home prepared for child    Risk Factors/Current Problems:  Basic Needs    Cognitive State:  Alert , Able to Concentrate    Mood/Affect:  Constricted , Calm    CSW Assessment: CSW consulted to see patient due to no prenatal care. CSW went to speak with patient and the photographer was in her room. When CSW asked if the photographer was finishing up, she replied that she was finished and would be leaving. CSW introduced self to patient and explained purpose of visit. The photographer remained in the room. CSW spoke with patient regarding if she had transportation and she replied that she did. She stated that she did not have any financial needs when asked. She did request a car seat and CSW informed her that she would need to find one and gave resources. As photographer remained in room, Bearden will return at later time to address the reason for no prenatal care. A consult was also placed for CSW on patient's newborn for drug exposure. When CSW asked nursing if she knew why, there was no indication and the urine drug screen came back negative for mom and the cord blood was sent off for testing. There is no history of drug use  documented in patient or newborn's history and physical.  CSW Plan/Description:  Engineer, mining , Information/Referral to [employer], [place] 04/25/2016, 10:35 AM

## 2016-04-25 NOTE — Care Management (Signed)
Received referral for wanting more information on medicaid, infant car seat, and baby clothes. Spoke with Ms. Camire at the bedside. Gave resource information about Insurance underwriter and information on [employer].  Shelbie Ammons RN MSN CCM Care Management

## 2016-04-25 NOTE — Anesthesia Postprocedure Evaluation (Signed)
Anesthesia Post Note  Patient: TEDI DENKINS  Procedure(s) Performed: Procedure(s) (LRB): REPEAT CESAREAN SECTION WITH BILATERAL TUBALIGATION (N/A)  Patient location during evaluation: Mother Baby Anesthesia Type: Spinal Level of consciousness: awake, awake and alert and oriented Pain management: satisfactory to patient Vital Signs Assessment: post-procedure vital signs reviewed and stable Respiratory status: spontaneous breathing Cardiovascular status: blood pressure returned to baseline Postop Assessment: no headache and no backache Anesthetic complications: no     Last Vitals:  Vitals:   04/25/16 0052 04/25/16 0402  BP: 123/76 111/72  Pulse: 83 78  Resp: 12 12  Temp: 36.8 C 36.8 C    Last Pain:  Vitals:   04/25/16 0402  TempSrc: Oral  PainSc:                  Hedda Slade

## 2016-04-26 LAB — SURGICAL PATHOLOGY

## 2016-04-26 MED ORDER — OXYCODONE-ACETAMINOPHEN 5-325 MG PO TABS: 2.0000 | ORAL_TABLET | ORAL | 0 refills | Status: DC | PRN

## 2016-04-26 MED ORDER — FERROUS SULFATE 325 (65 FE) MG PO TABS: 325.0000 mg | ORAL_TABLET | Freq: Two times a day (BID) | ORAL | 1 refills | Status: DC

## 2016-04-26 MED ORDER — IBUPROFEN 800 MG PO TABS: 800.0000 mg | ORAL_TABLET | Freq: Three times a day (TID) | ORAL | 0 refills | Status: DC

## 2016-04-26 MED ORDER — DOCUSATE SODIUM 100 MG PO CAPS: 100.0000 mg | ORAL_CAPSULE | Freq: Two times a day (BID) | ORAL | 2 refills | Status: DC | PRN

## 2016-04-26 NOTE — Discharge Instructions (Signed)
Please call your doctor or return to the ER if you experience any chest pains, dizziness, shortness of breath, fever greater than 101, any heavy bleeding (saturating more than 1 pad per hour), large clots, or foul smelling discharge, any worsening abdominal pain and cramping that is not controlled by pain medication, or any signs of postpartum depression. Check your incision daily for any signs of infection (warmth, redness, pus, foul-smelling drainage, etc.)  No tampons, enemas, douches, or sexual intercourse for 6 weeks. Also avoid tub baths, hot tubs, or swimming for 6 weeks.   Activity: Do not lift > 10 lbs for 6 weeks No intercourse or tampons for 6 weeks No driving for 1-2 weeks (or while taking pain medication)      Anemia, Nonspecific Anemia is a condition in which the concentration of red blood cells or hemoglobin in the blood is below normal. Hemoglobin is a substance in red blood cells that carries oxygen to the tissues of the body. Anemia results in not enough oxygen reaching these tissues. What are the causes? Common causes of anemia include:  Excessive bleeding. Bleeding may be internal or external. This includes excessive bleeding from periods (in women) or from the intestine.  Poor nutrition.  Chronic kidney, thyroid, and liver disease.  Bone marrow disorders that decrease red blood cell production.  Cancer and treatments for cancer.  HIV, AIDS, and their treatments.  Spleen problems that increase red blood cell destruction.  Blood disorders.  Excess destruction of red blood cells due to infection, medicines, and autoimmune disorders. What are the signs or symptoms?  Minor weakness.  Dizziness.  Headache.  Palpitations.  Shortness of breath, especially with exercise.  Paleness.  Cold sensitivity.  Indigestion.  Nausea.  Difficulty sleeping.  Difficulty concentrating. Symptoms may occur suddenly or they may develop slowly. How is this  diagnosed? Additional blood tests are often needed. These help your health care provider determine the best treatment. Your health care provider will check your stool for blood and look for other causes of blood loss. How is this treated? Treatment varies depending on the cause of the anemia. Treatment can include:  Supplements of iron, vitamin 123456, or folic acid.  Hormone medicines.  A blood transfusion. This may be needed if blood loss is severe.  Hospitalization. This may be needed if there is significant continual blood loss.  Dietary changes.  Spleen removal. Follow these instructions at home: Keep all follow-up appointments. It often takes many weeks to correct anemia, and having your health care provider check on your condition and your response to treatment is very important. Get help right away if:  You develop extreme weakness, shortness of breath, or chest pain.  You become dizzy or have trouble concentrating.  You develop heavy vaginal bleeding.  You develop a rash.  You have bloody or black, tarry stools.  You faint.  You vomit up blood.  You vomit repeatedly.  You have abdominal pain.  You have a fever or persistent symptoms for more than 2-3 days.  You have a fever and your symptoms suddenly get worse.  You are dehydrated. This information is not intended to replace advice given to you by your health care provider. Make sure you discuss any questions you have with your health care provider. Document Released: 03/30/2004 Document Revised: 08/04/2015 Document Reviewed: 08/16/2012 Elsevier Interactive Patient Education  2017 [employer].

## 2016-04-26 NOTE — Progress Notes (Signed)
Discharge order received from doctor. Reviewed discharge instructions and prescriptions with patient and answered all questions. Follow up appointment given. Patient verbalized understanding. ID bands checked. Patient discharged home with infant via wheelchair by nursing/auxillary.   Jaidin Richison Garner, RN  

## 2016-04-26 NOTE — Discharge Summary (Signed)
Physician Obstetric Discharge Summary  Patient ID: NARUMI FEWELL MRN: [id number] DOB/AGE: 1986/02/22 31 y.o.  Reason for Admission: onset of labor and prior C/S x 3; No Prenatal care; Anemia; Chlamydia Infection Prenatal Procedures: none Intrapartum Procedures: cesarean: low cervical, transverse, tubal ligation and IV antibiotics for Chlamydia Postpartum Procedures: none Complications-Operative and Postpartum: none  Delivery Note At 6:37 PM a viable female was delivered via C-Section, Low Transverse (Presentation:Cephalic ).  APGAR: 8, 9; weight 8 lb 8.5 oz (3870 g).   Placenta status: expressed.  Cord: 3VI with the following complications:meconium .  Cord pH: sent  Anesthesia:  Spinal Episiotomy:  NA Lacerations:  NA Suture Repair: see op note Est. Blood Loss (mL):  500  Mom to postpartum.  Baby to Couplet care / Skin to Skin.  Hassell Done A Layani Foronda 04/26/2016, 7:58 AM    APGAR: 8,9 ; weight  .    H/H:  Lab Results  Component Value Date/Time   HGB 8.7 (L) 04/25/2016 06:55 AM   HCT 26.3 (L) 04/25/2016 06:55 AM    Brief Hospital Course: PHRONIA ASCH is a [id number] who underwent cesarean section on 04/24/2016.  Patient had an uncomplicated surgery; for further details of this surgery, please refer to the operative note.  Patient had an uncomplicated postpartum course.  By time of discharge on POD#2, her pain was controlled on oral pain medications; she had appropriate lochia and was ambulating, voiding without difficulty, tolerating regular diet and passing flatus.   She was deemed stable for discharge to home.    Discharge Diagnoses: Term Pregnancy-delivered and Chlamydia infection, treated; Anemia, chronic, asymptomatic; Repeat C/S x 3; S/P 4th C/S  Discharge Information: Date: 04/26/2016 Activity: pelvic rest Diet: routine Baby feeding: plans to bottle feed Contraception: bilateral tubal ligation Medications: PNV, Ibuprofen, Colace, Iron and Percocet Discharged  Condition: good Instructions: refer to practice specific booklet Discharge to: home  Signed: Brayton Mars, MD 04/26/2016, 7:58 AM

## 2016-04-28 ENCOUNTER — Ambulatory Visit (INDEPENDENT_AMBULATORY_CARE_PROVIDER_SITE_OTHER): Payer: Self-pay | Admitting: Obstetrics and Gynecology

## 2016-04-28 VITALS — BP 127/82 | HR 99 | Ht 65.0 in | Wt 131.7 lb

## 2016-04-28 DIAGNOSIS — Z09 Encounter for follow-up examination after completed treatment for conditions other than malignant neoplasm: Secondary | ICD-10-CM

## 2016-04-28 DIAGNOSIS — D649 Anemia, unspecified: Secondary | ICD-10-CM | POA: Insufficient documentation

## 2016-04-28 DIAGNOSIS — Z9851 Tubal ligation status: Secondary | ICD-10-CM | POA: Insufficient documentation

## 2016-04-28 DIAGNOSIS — Z98891 History of uterine scar from previous surgery: Secondary | ICD-10-CM

## 2016-04-28 NOTE — Patient Instructions (Signed)
PLAN: 1. Continue taking iron twice a day 2. Continue taking multivitamins daily 3. Recommend obtaining over-the-counter ibuprofen or Aleve to help with pain relief 4. Continue taking Percocet for pain relief as prescribed 5. Continue with postoperative precautions 6. Return in 6 weeks for for postpartum check

## 2016-04-28 NOTE — Progress Notes (Signed)
Chief complaint: 1. Staple removal 2. Postop anemia 3. Status post repeat cesarean section delivery with bilateral tubal ligation  Patient presents for staples removal. She is having moderate discomfort, taking only Percocet for pain she did not get ibuprofen prescription filled because of financial constraints. She is having bowel movements. Voiding is normal. She is bottlefeeding. She is taking iron twice a day for her anemia.  OBJECTIVE: BP 127/82   Pulse 99   Ht 5\' 5"  (1.651 m)   Wt 131 lb 11.2 oz (59.7 kg)   Breastfeeding? No   BMI 21.92 kg/m  Pleasant tired appearing female in no acute distress Abdomen: Soft, nondistended, incision well approximated; Staples are removed and Steri-Strips are applied  ASSESSMENT: 1. Staple removal 2. Status post repeat cesarean section delivery with bilateral tubal ligation 3. Anemia  PLAN: 1. Continue taking iron twice a day 2. Continue taking multivitamins daily 3. Recommend obtaining over-the-counter ibuprofen or Aleve to help with pain relief 4. Continue taking Percocet for pain relief as prescribed 5. Continue with postoperative precautions 6. Return in 6 weeks for for postpartum check  Brayton Mars, MD  Note: This dictation was prepared with Dragon dictation along with smaller phrase technology. Any transcriptional errors that result from this process are unintentional.

## 2016-06-01 ENCOUNTER — Ambulatory Visit: Payer: Self-pay | Admitting: Obstetrics and Gynecology

## 2016-08-22 ENCOUNTER — Encounter: Payer: Self-pay | Admitting: Emergency Medicine

## 2016-08-22 ENCOUNTER — Emergency Department
Admission: EM | Admit: 2016-08-22 | Discharge: 2016-08-22 | Disposition: A | Discharge status: Home / Self Care | Payer: Medicaid Other | Attending: Emergency Medicine | Admitting: Emergency Medicine

## 2016-08-22 DIAGNOSIS — R300 Dysuria: Secondary | ICD-10-CM | POA: Diagnosis present

## 2016-08-22 DIAGNOSIS — N12 Tubulo-interstitial nephritis, not specified as acute or chronic: Secondary | ICD-10-CM

## 2016-08-22 DIAGNOSIS — N10 Acute tubulo-interstitial nephritis: Secondary | ICD-10-CM | POA: Insufficient documentation

## 2016-08-22 DIAGNOSIS — F17210 Nicotine dependence, cigarettes, uncomplicated: Secondary | ICD-10-CM | POA: Diagnosis not present

## 2016-08-22 LAB — URINALYSIS, COMPLETE (UACMP) WITH MICROSCOPIC
Bilirubin Urine: NEGATIVE
Glucose, UA: NEGATIVE mg/dL
Ketones, ur: 20 mg/dL — AB
Nitrite: POSITIVE — AB
Protein, ur: 100 mg/dL — AB
SPECIFIC GRAVITY, URINE: 1.013 (ref 1.005–1.030)
pH: 6 (ref 5.0–8.0)

## 2016-08-22 LAB — POCT PREGNANCY, URINE: Preg Test, Ur: NEGATIVE

## 2016-08-22 MED ORDER — IBUPROFEN 600 MG PO TABS
600.0000 mg | ORAL_TABLET | Freq: Once | ORAL | Status: AC
  Administered 2016-08-22: 600 mg via ORAL
  Filled 2016-08-22: qty 1

## 2016-08-22 MED ORDER — SODIUM CHLORIDE 0.9 % IV BOLUS (SEPSIS)
1000.0000 mL | Freq: Once | INTRAVENOUS | Status: AC
  Administered 2016-08-22: 1000 mL via INTRAVENOUS

## 2016-08-22 MED ORDER — SULFAMETHOXAZOLE-TRIMETHOPRIM 800-160 MG PO TABS
1.0000 | ORAL_TABLET | Freq: Once | ORAL | Status: AC
  Administered 2016-08-22: 1 via ORAL
  Filled 2016-08-22: qty 1

## 2016-08-22 MED ORDER — SULFAMETHOXAZOLE-TRIMETHOPRIM 800-160 MG PO TABS: 1.0000 | ORAL_TABLET | Freq: Two times a day (BID) | ORAL | 0 refills | Status: AC

## 2016-08-22 NOTE — ED Notes (Signed)
Pt to ed with c/o frequency of urine and burning with urination x 2 days.  Pt also reports left flank pain.

## 2016-08-22 NOTE — ED Provider Notes (Signed)
[facility] Emergency Department Provider Note   ____________________________________________   I have reviewed the triage vital signs and the nursing notes.   HISTORY  Chief Complaint Urinary Tract Infection    HPI [name] is a 31 y.o. female presents with dysuria, intermittent urine flow, fever, left flank pain, and nausea/vomiting for 2 days. Patient has taken Motrin for her fever and notes that it has helped reduce the fever but did not resolve it. Patient always drinks [facility] and does not like to drink water. Patient reports in the last 3 days and inability to keep food or fluids down. Patient denies past history of urinary tract infections or kidney infections. Patient denies headache, vision changes, chest pain, chest tightness, shortness of breath, and abdominal pain.  Past Medical History:  Diagnosis Date  . Anemia     Patient Active Problem List   Diagnosis Date Noted  . Anemia 04/28/2016  . Status post tubal ligation 04/28/2016  . Status post repeat low transverse cesarean section 04/24/2016  . Back pain 01/17/2016    Past Surgical History:  Procedure Laterality Date  . CESAREAN SECTION    . CESAREAN SECTION N/A 04/24/2016   Procedure: REPEAT CESAREAN SECTION WITH BILATERAL TUBALIGATION;  Surgeon: Brayton Mars, MD;  Location: [facility] ORS;  Service: Obstetrics;  Laterality: N/A;  Baby Boy born @ 25 Apgars:8/9 Weight: 8lb 9oz    Prior to Admission medications   Medication Sig Start Date End Date Taking? Authorizing Provider  docusate sodium (COLACE) 100 MG capsule Take 1 capsule (100 mg total) by mouth 2 (two) times daily as needed. 04/26/16   Defrancesco, Alanda Slim, MD  ferrous sulfate (FERROUSUL) 325 (65 FE) MG tablet Take 1 tablet (325 mg total) by mouth 2 (two) times daily. 04/26/16   Defrancesco, Alanda Slim, MD  oxyCODONE-acetaminophen (PERCOCET/ROXICET) 5-325 MG tablet Take 2 tablets by mouth every 4 (four) hours  as needed (pain scale > 7). 04/26/16   Defrancesco, Alanda Slim, MD  sulfamethoxazole-trimethoprim (BACTRIM DS,SEPTRA DS) 800-160 MG tablet Take 1 tablet by mouth 2 (two) times daily. 08/22/16 09/01/16  Jamariya Davidoff, Laroy Apple, PA-C    Allergies Patient has no known allergies.  Family History  Problem Relation Age of Onset  . Hypertension Mother   . Stroke Father   . Cancer Maternal Grandmother   . Diabetes Maternal Grandfather   . Heart disease Maternal Grandfather   . Hypertension Maternal Grandfather     Social History Social History  Substance Use Topics  . Smoking status: Current Every Day Smoker    Packs/day: 0.25    Years: 5.00    Types: Cigarettes  . Smokeless tobacco: Never Used  . Alcohol use No    Review of Systems Constitutional: Positive for fever/chills Eyes: No visual changes. ENT:  Negative for sore throat and for difficulty swallowing Cardiovascular: Denies chest pain. Respiratory: Denies cough Denies shortness of breath. Gastrointestinal: Lower abdominal pain.  Nausea and vomiting. Genitourinary: Positive for dysuria, interrupted urine flow. Left side flank pain. Musculoskeletal: Negative for back pain. Negative for generalized body aches. Skin: Negative for rash. Neurological: Negative for headaches. Able to ambulate. ____________________________________________   PHYSICAL EXAM:  VITAL SIGNS: ED Triage Vitals  Enc Vitals Group     BP 08/22/16 1408 138/71     Pulse Rate 08/22/16 1408 80     Resp 08/22/16 1408 20     Temp 08/22/16 1408 (!) 100.5 F (38.1 C)     Temp Source 08/22/16  1408 Oral     SpO2 08/22/16 1408 98 %     Weight 08/22/16 1408 127 lb (57.6 kg)     Height 08/22/16 1408 5\' 5"  (1.651 m)     Head Circumference --      Peak Flow --      Pain Score 08/22/16 1415 10     Pain Loc --      Pain Edu? --      Excl. in [place]? --     Constitutional: Alert and oriented. Well appearing and mild distress. Febrile. Head: Normocephalic and  atraumatic. Eyes: Conjunctivae are normal. PERRL. Nose: No congestion/rhinorrhea Mouth/Throat: Mucous membranes are moist. Neck: Supple. Cardiovascular: Normal rate, regular rhythm. Normal distal pulses. Respiratory: Normal respiratory effort. No wheezes/rales/rhonchi. Lungs CTAB. Gastrointestinal: Soft and nontender except lower pelvic region. Genitourinary: Dysuria, intermittent flow, pelvic pain, Left side CVA tenderness Musculoskeletal: Nontender with normal range of motion in all extremities. Neurologic: Normal speech and language. No gross focal neurologic deficits are appreciated.  Skin:  Skin is warm, dry and intact. No rash noted. Psychiatric: Mood and affect are normal.  ____________________________________________   LABS (all labs ordered are listed, but only abnormal results are displayed)  Labs Reviewed  URINALYSIS, COMPLETE (UACMP) WITH MICROSCOPIC - Abnormal; Notable for the following:       Result Value   Color, Urine AMBER (*)    APPearance TURBID (*)    Hgb urine dipstick MODERATE (*)    Ketones, ur 20 (*)    Protein, ur 100 (*)    Nitrite POSITIVE (*)    Leukocytes, UA LARGE (*)    Bacteria, UA RARE (*)    Squamous Epithelial / LPF 6-30 (*)    Non Squamous Epithelial 0-5 (*)    All other components within normal limits  POC URINE PREG, ED  POCT PREGNANCY, URINE   ____________________________________________  EKG none ____________________________________________  RADIOLOGY none ____________________________________________   PROCEDURES  Procedure(s) performed: no    Critical Care performed: no ____________________________________________   INITIAL IMPRESSION / ASSESSMENT AND PLAN / ED COURSE  Pertinent labs & imaging results that were available during my care of the patient were reviewed by me and considered in my medical decision making (see chart for details).  Patient presents with dysuria, lower pelvic pain, CVA tenderness and fever.  Patient history, physical exam findings and labs are consistent with pyelonephritis. Patient given NS IVF, ibuprofen and initial dose of Bactrim during course of care in emergency department. Vital signs were reassuring on discharge. Patient will continue Bactrim antibiotic course as prescribed, ibuprofen as needed. Recommended she encourage fluids as tolerated to improve hydration. Patient informed of clinical course, understand medical decision-making process, and agree with plan.  Patient was advised to follow up with PCP as needed and was also advised to return to the emergency department for symptoms that change or worsen.        FINAL CLINICAL IMPRESSION(S) / ED DIAGNOSES  Final diagnoses:  Pyelonephritis  Dysuria       NEW MEDICATIONS STARTED DURING THIS VISIT:  Discharge Medication List as of 08/22/2016  4:55 PM    START taking these medications   Details  sulfamethoxazole-trimethoprim (BACTRIM DS,SEPTRA DS) 800-160 MG tablet Take 1 tablet by mouth 2 (two) times daily., Starting Tue 08/22/2016, Until Fri 09/01/2016, Print         Note:  This document was prepared using Dragon voice recognition software and may include unintentional dictation errors.   Adalyn Pennock, Laroy Apple, PA-C 08/22/16  [place], [place], MD 08/26/16 1150

## 2016-08-22 NOTE — ED Triage Notes (Signed)
Pt reports burning and painful urination x2 days.

## 2018-09-18 ENCOUNTER — Ambulatory Visit
Admission: RE | Admit: 2018-09-18 | Discharge: 2018-09-18 | Disposition: A | Discharge status: Home / Self Care | Payer: Self-pay | Source: Ambulatory Visit | Attending: Oncology | Admitting: Oncology

## 2018-09-18 ENCOUNTER — Other Ambulatory Visit: Payer: Self-pay

## 2018-09-18 ENCOUNTER — Ambulatory Visit: Payer: Self-pay | Attending: Oncology | Admitting: *Deleted

## 2018-09-18 ENCOUNTER — Other Ambulatory Visit: Payer: Self-pay | Admitting: Oncology

## 2018-09-18 ENCOUNTER — Other Ambulatory Visit: Payer: Self-pay | Admitting: *Deleted

## 2018-09-18 ENCOUNTER — Encounter: Payer: Self-pay | Admitting: *Deleted

## 2018-09-18 VITALS — BP 120/74 | HR 73 | Temp 96.4°F | Ht 65.0 in | Wt 116.5 lb

## 2018-09-18 DIAGNOSIS — N63 Unspecified lump in unspecified breast: Secondary | ICD-10-CM

## 2018-09-18 DIAGNOSIS — R928 Other abnormal and inconclusive findings on diagnostic imaging of breast: Secondary | ICD-10-CM

## 2018-09-18 DIAGNOSIS — R921 Mammographic calcification found on diagnostic imaging of breast: Secondary | ICD-10-CM

## 2018-09-18 NOTE — Progress Notes (Signed)
  Subjective:     Patient ID: [name], female   DOB: 23-Aug-1985, 33 y.o.   MRN: [id number]  HPI   Review of Systems     Objective:   Physical Exam Chest:     Breasts: Breasts are asymmetrical.        Right: Mass and skin change present. No swelling, bleeding, inverted nipple, nipple discharge or tenderness.        Left: No swelling, bleeding, inverted nipple, mass, nipple discharge, skin change or tenderness.    Lymphadenopathy:     Upper Body:     Right upper body: No supraclavicular or axillary adenopathy.     Left upper body: No supraclavicular or axillary adenopathy.        Assessment:     33 year old Black female referred to [place] by Drucilla Chalet, FNP at the [facility] for further evaluation of a right breast mass.  Patient states the mass has been present for about 4 months and has been getting larger.  States in the past month it became painful with a stinging pain when touched or in the shower.  On visual inspection of the breast, the right breast is fuller and more uplifted than the left. I can visually see a scabbed area with flat red tight skin around the scab.  Patient states her breast just became this way since she found the mass.  On clinical breast exam I can palpate an approximate 5X4 cm hard, non-mobile irregular mass extending from 12:00 at the edge of the areola to the inframmary ridge at 5:00 where the skin changes are noted. There is no erythema, nipple discharge or lymphadenopathy noted.  The patient is afebrile also.  Since there is no erythema associated with the mass or other symptoms of possible infection, I am more concerned with the possibility of locally advanced breast cancer verses an abscess.  Taught self breast awareness.  Patient has been screened for eligibility.  She does not have any insurance, Medicare or Medicaid.  She also meets financial eligibility.  Hand-out given on the Affordable Care Act. Plan:     I have ordered a bilateral  diagnostic mammogram if tolerable, and bilateral ultrasounds.  Discussed with patient that if the mass looks like an abscess on imaging we can refer her for a surgical consult for treatment.  Also discussed the possibility of having a biopsy if the mass is solid.  She is agreeable to the plan.  Will follow-up per [facility] protocol.

## 2018-09-19 ENCOUNTER — Encounter: Payer: Self-pay | Admitting: *Deleted

## 2018-09-19 NOTE — Progress Notes (Signed)
Called patient today to review her abnormal mammogram results and need for a biopsy.  She understands to call me back if she has not heard from the [facility] breast center with her biopsy appointment.

## 2018-09-25 ENCOUNTER — Ambulatory Visit: Payer: Self-pay

## 2018-09-25 ENCOUNTER — Ambulatory Visit: Admission: RE | Admit: 2018-09-25 | Payer: Self-pay | Source: Ambulatory Visit

## 2018-10-01 ENCOUNTER — Ambulatory Visit: Payer: Self-pay

## 2018-10-01 ENCOUNTER — Ambulatory Visit: Admission: RE | Admit: 2018-10-01 | Payer: Self-pay | Source: Ambulatory Visit

## 2018-10-01 ENCOUNTER — Ambulatory Visit: Payer: Self-pay | Attending: Oncology

## 2018-10-02 NOTE — Progress Notes (Signed)
Phoned patient to reschedule breast biopsy.  No answer, and unable to leave message.  Phoned her mother , who is her emergency contact and requested patient call back to reschedule.

## 2018-10-10 ENCOUNTER — Encounter: Payer: Self-pay | Admitting: *Deleted

## 2018-10-10 NOTE — Progress Notes (Signed)
Called patient today to review importance of getting her biopsy.  She has missed the last 2 appointments.  States her mom is taking the day off to take her.  Encouraged her to call me if things change and she does not have a ride for that day and I will arrange for a cab to pick her up.  We discussed the possibility of a positive diagnosis and that we can have her apply for [facility] Medicaid.  She is agreeable.

## 2018-10-17 ENCOUNTER — Ambulatory Visit: Payer: Self-pay

## 2018-10-17 ENCOUNTER — Ambulatory Visit: Payer: Self-pay | Attending: Oncology

## 2018-10-17 ENCOUNTER — Ambulatory Visit: Admission: RE | Admit: 2018-10-17 | Payer: Self-pay | Source: Ambulatory Visit

## 2018-10-23 ENCOUNTER — Encounter: Payer: Self-pay | Admitting: *Deleted

## 2018-10-23 NOTE — Progress Notes (Signed)
Tried to call patient to discuss not showing up for her biopsy.  This is 3 times she has missed and appointment.  No answer and voicemail is full.

## 2018-10-24 ENCOUNTER — Encounter: Payer: Self-pay | Admitting: *Deleted

## 2018-10-24 NOTE — Progress Notes (Signed)
Tried to call patient and her emergency contact, but no answer and patients voicemail was full.  I did leave a message with patients mom to have patient return my call.  Certified letter mailed to patient stressing the importance of getting a biopsy, and what barriers can we help her overcome to get there.

## 2018-10-24 NOTE — Progress Notes (Signed)
Patient returned call to [facility].  She is scheduled to see Dr. Hampton Abbot tomorrow at 11:00.  She is to be there at 10:45.  Patient states she has transportation tomorrow.  Had to cancel the appointment.  They do not do biopsies in the office.  Patient made aware.  Left message for Deniece in the breast center to see if we can reschedule the patient since she has missed 3 appointments there.  If not will see if we can schedule her in [place] and give her a gas voucher.

## 2018-10-25 ENCOUNTER — Ambulatory Visit
Admission: RE | Admit: 2018-10-25 | Discharge: 2018-10-25 | Disposition: A | Discharge status: Home / Self Care | Payer: Medicaid Other | Source: Ambulatory Visit | Attending: Oncology | Admitting: Oncology

## 2018-10-25 ENCOUNTER — Ambulatory Visit: Payer: Self-pay | Admitting: Surgery

## 2018-10-25 ENCOUNTER — Other Ambulatory Visit: Payer: Self-pay

## 2018-10-25 DIAGNOSIS — C773 Secondary and unspecified malignant neoplasm of axilla and upper limb lymph nodes: Secondary | ICD-10-CM | POA: Insufficient documentation

## 2018-10-25 DIAGNOSIS — Z171 Estrogen receptor negative status [ER-]: Secondary | ICD-10-CM | POA: Insufficient documentation

## 2018-10-25 DIAGNOSIS — N63 Unspecified lump in unspecified breast: Secondary | ICD-10-CM

## 2018-10-25 DIAGNOSIS — C50311 Malignant neoplasm of lower-inner quadrant of right female breast: Secondary | ICD-10-CM | POA: Diagnosis not present

## 2018-10-25 DIAGNOSIS — N6314 Unspecified lump in the right breast, lower inner quadrant: Secondary | ICD-10-CM | POA: Diagnosis present

## 2018-10-25 DIAGNOSIS — N6313 Unspecified lump in the right breast, lower outer quadrant: Secondary | ICD-10-CM | POA: Diagnosis present

## 2018-10-25 HISTORY — PX: BREAST BIOPSY: SHX20

## 2018-10-28 ENCOUNTER — Other Ambulatory Visit: Payer: Self-pay | Admitting: *Deleted

## 2018-10-28 ENCOUNTER — Encounter: Payer: Self-pay | Admitting: *Deleted

## 2018-10-28 ENCOUNTER — Other Ambulatory Visit: Payer: Self-pay | Admitting: Anatomic Pathology & Clinical Pathology

## 2018-10-28 DIAGNOSIS — C50912 Malignant neoplasm of unspecified site of left female breast: Secondary | ICD-10-CM

## 2018-10-28 DIAGNOSIS — C50611 Malignant neoplasm of axillary tail of right female breast: Secondary | ICD-10-CM

## 2018-10-28 NOTE — Progress Notes (Signed)
Patient with new diagnosis of invasive breast cancer.  Tried to call patient to review her results, but no answer and her voicemail is full.

## 2018-10-28 NOTE — Progress Notes (Signed)
Called patient today to discuss her biopsy results.  She had been notified by Electa Sniff, RN with [facility] of her positive results.  Discussed need for medical oncology and surgical consult.  I have scheduled her to see Dr. Janese Banks on 10/31/18 @ 10:45 and Dr. Dahlia Byes on 11/04/18 @ 9:30.  She is to come by the clinic tomorrow to sign paperwork for [facility].  I will give patient breast cancer educational literature, "My Breast Cancer Treatment Handbook" by Josephine Igo, RN at that time.  She is to call with any questions or needs.

## 2018-10-29 ENCOUNTER — Other Ambulatory Visit: Payer: Self-pay | Admitting: *Deleted

## 2018-10-29 DIAGNOSIS — C50912 Malignant neoplasm of unspecified site of left female breast: Secondary | ICD-10-CM

## 2018-10-30 ENCOUNTER — Other Ambulatory Visit: Payer: Self-pay

## 2018-10-30 ENCOUNTER — Encounter: Payer: Self-pay | Admitting: *Deleted

## 2018-10-30 NOTE — Progress Notes (Signed)
Patient came by yesterday to complete [facility] Medicaid application.  Application faxed to DSS.  Gave patient breast cancer educational literature, "My Breast Cancer Treatment Handbook" by Josephine Igo, RN.  Informed patient that Webb Silversmith or myself would meet her on Thursday at her appointment with Dr. Janese Banks.  She is agreeable.

## 2018-10-31 ENCOUNTER — Inpatient Hospital Stay: Payer: Medicaid Other

## 2018-10-31 ENCOUNTER — Inpatient Hospital Stay: Payer: Medicaid Other | Attending: Oncology | Admitting: Oncology

## 2018-10-31 ENCOUNTER — Other Ambulatory Visit: Payer: Self-pay

## 2018-10-31 ENCOUNTER — Other Ambulatory Visit: Payer: Self-pay | Admitting: *Deleted

## 2018-10-31 VITALS — BP 134/80 | HR 86 | Temp 97.5°F | Ht 65.0 in | Wt 111.5 lb

## 2018-10-31 DIAGNOSIS — C50912 Malignant neoplasm of unspecified site of left female breast: Secondary | ICD-10-CM | POA: Diagnosis not present

## 2018-10-31 DIAGNOSIS — Z170 Estrogen receptor positive status [ER+]: Secondary | ICD-10-CM | POA: Insufficient documentation

## 2018-10-31 DIAGNOSIS — D649 Anemia, unspecified: Secondary | ICD-10-CM

## 2018-10-31 DIAGNOSIS — Z809 Family history of malignant neoplasm, unspecified: Secondary | ICD-10-CM | POA: Insufficient documentation

## 2018-10-31 DIAGNOSIS — C50211 Malignant neoplasm of upper-inner quadrant of right female breast: Secondary | ICD-10-CM | POA: Insufficient documentation

## 2018-10-31 DIAGNOSIS — F17210 Nicotine dependence, cigarettes, uncomplicated: Secondary | ICD-10-CM | POA: Insufficient documentation

## 2018-10-31 DIAGNOSIS — M549 Dorsalgia, unspecified: Secondary | ICD-10-CM | POA: Insufficient documentation

## 2018-10-31 DIAGNOSIS — D509 Iron deficiency anemia, unspecified: Secondary | ICD-10-CM

## 2018-10-31 DIAGNOSIS — C7951 Secondary malignant neoplasm of bone: Secondary | ICD-10-CM | POA: Diagnosis not present

## 2018-10-31 DIAGNOSIS — R918 Other nonspecific abnormal finding of lung field: Secondary | ICD-10-CM | POA: Insufficient documentation

## 2018-10-31 LAB — CBC WITH DIFFERENTIAL/PLATELET
Abs Immature Granulocytes: 0.01 K/uL (ref 0.00–0.07)
Basophils Absolute: 0 K/uL (ref 0.0–0.1)
Basophils Relative: 1 %
Eosinophils Absolute: 0.1 K/uL (ref 0.0–0.5)
Eosinophils Relative: 2 %
HCT: 29.9 % — ABNORMAL LOW (ref 36.0–46.0)
Hemoglobin: 8.9 g/dL — ABNORMAL LOW (ref 12.0–15.0)
Immature Granulocytes: 0 %
Lymphocytes Relative: 30 %
Lymphs Abs: 2 K/uL (ref 0.7–4.0)
MCH: 19.2 pg — ABNORMAL LOW (ref 26.0–34.0)
MCHC: 29.8 g/dL — ABNORMAL LOW (ref 30.0–36.0)
MCV: 64.6 fL — ABNORMAL LOW (ref 80.0–100.0)
Monocytes Absolute: 0.5 K/uL (ref 0.1–1.0)
Monocytes Relative: 8 %
Neutro Abs: 4.2 K/uL (ref 1.7–7.7)
Neutrophils Relative %: 59 %
Platelets: 389 K/uL (ref 150–400)
RBC: 4.63 MIL/uL (ref 3.87–5.11)
RDW: 22.1 % — ABNORMAL HIGH (ref 11.5–15.5)
WBC: 6.9 K/uL (ref 4.0–10.5)
nRBC: 0 % (ref 0.0–0.2)

## 2018-10-31 LAB — COMPREHENSIVE METABOLIC PANEL
ALT: 10 U/L (ref 0–44)
AST: 18 U/L (ref 15–41)
Albumin: 4.5 g/dL (ref 3.5–5.0)
Alkaline Phosphatase: 57 U/L (ref 38–126)
Anion gap: 6 (ref 5–15)
BUN: 14 mg/dL (ref 6–20)
CO2: 27 mmol/L (ref 22–32)
Calcium: 9.5 mg/dL (ref 8.9–10.3)
Chloride: 106 mmol/L (ref 98–111)
Creatinine, Ser: 0.73 mg/dL (ref 0.44–1.00)
GFR calc Af Amer: >60 mL/min (ref >60)
GFR calc non Af Amer: >60 mL/min (ref >60)
Glucose, Bld: 109 mg/dL — ABNORMAL HIGH (ref 70–99)
Potassium: 3.9 mmol/L (ref 3.5–5.1)
Sodium: 139 mmol/L (ref 135–145)
Total Bilirubin: 0.4 mg/dL (ref 0.3–1.2)
Total Protein: 8.5 g/dL — ABNORMAL HIGH (ref 6.5–8.1)

## 2018-10-31 LAB — IRON AND TIBC
Iron: 19 ug/dL — ABNORMAL LOW (ref 28–170)
Saturation Ratios: 4 % — ABNORMAL LOW (ref 10.4–31.8)
TIBC: 450 ug/dL (ref 250–450)
UIBC: 431 ug/dL

## 2018-10-31 LAB — FERRITIN: Ferritin: 4 ng/mL — ABNORMAL LOW (ref 11–307)

## 2018-10-31 NOTE — Progress Notes (Signed)
fe 

## 2018-10-31 NOTE — Progress Notes (Signed)
  Oncology Nurse Navigator Documentation  Navigator Location: [facility]Med Onc (10/31/18 1400)   )Navigator Encounter Type: Initial MedOnc (10/31/18 1400)                       Treatment Phase: Pre-Tx/Tx Discussion (10/31/18 1400) Barriers/Navigation Needs: Coordination of Care;Education;Family Concerns;Financial (10/31/18 1400) Education: Concerns with Finances/ Eligibility;Concerns with Insurance Coverage;Coping with Diagnosis/ Prognosis;Newly Diagnosed Cancer Education (10/31/18 1400) Interventions: Coordination of Care;Education;Psycho-Social Support (10/31/18 1400)   Coordination of Care: Appts;Radiology (10/31/18 1400) Education Method: Teach-back;Verbal (10/31/18 1400)                Time Spent with Patient: 90 (10/31/18 1400)   Supported patient ,amother at initial Med/Onc visit.  Dr. Janese Banks thoroughly explained necessity for starting, and compliance with treatment plan.  Reviewed upcoming appointments. Given gas voucher for treatment transportation.

## 2018-11-01 ENCOUNTER — Telehealth: Payer: Self-pay | Admitting: Surgery

## 2018-11-01 ENCOUNTER — Other Ambulatory Visit
Admission: RE | Admit: 2018-11-01 | Discharge: 2018-11-01 | Disposition: A | Discharge status: Home / Self Care | Payer: Medicaid Other | Source: Ambulatory Visit | Attending: Surgery | Admitting: Surgery

## 2018-11-01 DIAGNOSIS — Z01812 Encounter for preprocedural laboratory examination: Secondary | ICD-10-CM | POA: Insufficient documentation

## 2018-11-01 DIAGNOSIS — Z20828 Contact with and (suspected) exposure to other viral communicable diseases: Secondary | ICD-10-CM | POA: Insufficient documentation

## 2018-11-01 LAB — SURGICAL PATHOLOGY

## 2018-11-01 LAB — CANCER ANTIGEN 15-3: CA 15-3: 28.2 U/mL — ABNORMAL HIGH (ref 0.0–25.0)

## 2018-11-01 LAB — CANCER ANTIGEN 27.29: CA 27.29: 51.7 U/mL — ABNORMAL HIGH (ref 0.0–38.6)

## 2018-11-01 NOTE — Telephone Encounter (Signed)
Pt advised of pre op date/time and sx date. Sx: 11/05/18-port placement with Dr Rayburn Ma Pre op: 11/04/18 between 8-1-Phone interview Patient is aware to have the mandatory Covid test completed on -  11/01/18 before 1:[facility].   Patient made aware to call [phone], between 1-3:00pm the day before surgery, to find out what time to arrive.

## 2018-11-02 LAB — SARS CORONAVIRUS 2 (TAT 6-24 HRS): SARS Coronavirus 2: NEGATIVE

## 2018-11-04 ENCOUNTER — Encounter: Payer: Self-pay | Admitting: Surgery

## 2018-11-04 ENCOUNTER — Encounter
Admission: RE | Admit: 2018-11-04 | Discharge: 2018-11-04 | Disposition: A | Discharge status: Home / Self Care | Payer: Medicaid Other | Source: Ambulatory Visit | Attending: Cardiovascular Disease | Admitting: Cardiovascular Disease

## 2018-11-04 ENCOUNTER — Other Ambulatory Visit: Payer: Self-pay

## 2018-11-04 ENCOUNTER — Encounter
Admission: RE | Admit: 2018-11-04 | Discharge: 2018-11-04 | Disposition: A | Discharge status: Home / Self Care | Payer: Medicaid Other | Source: Ambulatory Visit | Attending: Oncology | Admitting: Oncology

## 2018-11-04 ENCOUNTER — Ambulatory Visit (INDEPENDENT_AMBULATORY_CARE_PROVIDER_SITE_OTHER): Payer: Self-pay | Admitting: Surgery

## 2018-11-04 VITALS — BP 154/95 | HR 81 | Temp 97.7°F | Resp 16 | Ht 65.0 in | Wt 111.6 lb

## 2018-11-04 DIAGNOSIS — C50611 Malignant neoplasm of axillary tail of right female breast: Secondary | ICD-10-CM | POA: Diagnosis present

## 2018-11-04 DIAGNOSIS — N63 Unspecified lump in unspecified breast: Secondary | ICD-10-CM

## 2018-11-04 DIAGNOSIS — C50912 Malignant neoplasm of unspecified site of left female breast: Secondary | ICD-10-CM | POA: Insufficient documentation

## 2018-11-04 LAB — GLUCOSE, CAPILLARY: Glucose-Capillary: 94 mg/dL (ref 70–99)

## 2018-11-04 MED ORDER — CEFAZOLIN SODIUM-DEXTROSE 2-4 GM/100ML-% IV SOLN
2.0000 g | INTRAVENOUS | Status: AC
  Administered 2018-11-05: 2 g via INTRAVENOUS

## 2018-11-04 MED ORDER — GABAPENTIN 300 MG PO CAPS: 300.0000 mg | ORAL_CAPSULE | Freq: Three times a day (TID) | ORAL | 0 refills | Status: AC

## 2018-11-04 MED ORDER — FLUDEOXYGLUCOSE F - 18 (FDG) INJECTION
5.2000 | Freq: Once | INTRAVENOUS | Status: AC | PRN
  Administered 2018-11-04: 5.99 via INTRAVENOUS

## 2018-11-04 NOTE — Patient Instructions (Addendum)
Please pick up your medication at the pharmacy today.     Implanted [facility] Guide An implanted port is a device that is placed under the skin. It is usually placed in the chest. The device can be used to give IV medicine, to take blood, or for dialysis. You may have an implanted port if:  You need IV medicine that would be irritating to the small veins in your hands or arms.  You need IV medicines, such as antibiotics, for a long period of time.  You need IV nutrition for a long period of time.  You need dialysis. Having a port means that your health care provider will not need to use the veins in your arms for these procedures. You may have fewer limitations when using a port than you would if you used other types of long-term IVs, and you will likely be able to return to normal activities after your incision heals. An implanted port has two main parts:  Reservoir. The reservoir is the part where a needle is inserted to give medicines or draw blood. The reservoir is round. After it is placed, it appears as a small, raised area under your skin.  Catheter. The catheter is a thin, flexible tube that connects the reservoir to a vein. Medicine that is inserted into the reservoir goes into the catheter and then into the vein. How is my port accessed? To access your port:  A numbing cream may be placed on the skin over the port site.  Your health care provider will put on a mask and sterile gloves.  The skin over your port will be cleaned carefully with a germ-killing soap and allowed to dry.  Your health care provider will gently pinch the port and insert a needle into it.  Your health care provider will check for a blood return to make sure the port is in the vein and is not clogged.  If your port needs to remain accessed to get medicine continuously (constant infusion), your health care provider will place a clear bandage (dressing) over the needle site. The dressing and needle will  need to be changed every week, or as told by your health care provider. What is flushing? Flushing helps keep the port from getting clogged. Follow instructions from your health care provider about how and when to flush the port. Ports are usually flushed with saline solution or a medicine called heparin. The need for flushing will depend on how the port is used:  If the port is only used from time to time to give medicines or draw blood, the port may need to be flushed: ? Before and after medicines have been given. ? Before and after blood has been drawn. ? As part of routine maintenance. Flushing may be recommended every 4-6 weeks.  If a constant infusion is running, the port may not need to be flushed.  Throw away any syringes in a disposal container that is meant for sharp items (sharps container). You can buy a sharps container from a pharmacy, or you can make one by using an empty hard plastic bottle with a cover. How long will my port stay implanted? The port can stay in for as long as your health care provider thinks it is needed. When it is time for the port to come out, a surgery will be done to remove it. The surgery will be similar to the procedure that was done to put the port in. Follow these instructions at  home:   Flush your port as told by your health care provider.  If you need an infusion over several days, follow instructions from your health care provider about how to take care of your port site. Make sure you: ? Wash your hands with soap and water before you change your dressing. If soap and water are not available, use alcohol-based hand sanitizer. ? Change your dressing as told by your health care provider. ? Place any used dressings or infusion bags into a plastic bag. Throw that bag in the trash. ? Keep the dressing that covers the needle clean and dry. Do not get it wet. ? Do not use scissors or sharp objects near the tube. ? Keep the tube clamped, unless it is  being used.  Check your port site every day for signs of infection. Check for: ? Redness, swelling, or pain. ? Fluid or blood. ? Pus or a bad smell.  Protect the skin around the port site. ? Avoid wearing bra straps that rub or irritate the site. ? Protect the skin around your port from seat belts. Place a soft pad over your chest if needed.  Bathe or shower as told by your health care provider. The site may get wet as long as you are not actively receiving an infusion.  Return to your normal activities as told by your health care provider. Ask your health care provider what activities are safe for you.  Carry a medical alert card or wear a medical alert bracelet at all times. This will let health care providers know that you have an implanted port in case of an emergency. Get help right away if:  You have redness, swelling, or pain at the port site.  You have fluid or blood coming from your port site.  You have pus or a bad smell coming from the port site.  You have a fever. Summary  Implanted ports are usually placed in the chest for long-term IV access.  Follow instructions from your health care provider about flushing the port and changing bandages (dressings).  Take care of the area around your port by avoiding clothing that puts pressure on the area, and by watching for signs of infection.  Protect the skin around your port from seat belts. Place a soft pad over your chest if needed.  Get help right away if you have a fever or you have redness, swelling, pain, drainage, or a bad smell at the port site. This information is not intended to replace advice given to you by your health care provider. Make sure you discuss any questions you have with your health care provider. Document Released: 02/20/2005 Document Revised: 06/14/2018 Document Reviewed: 03/25/2016 Elsevier Patient Education  2020 [employer].

## 2018-11-04 NOTE — Progress Notes (Signed)
  Oncology Nurse Navigator Documentation  Navigator Location: [facility] (11/04/18 1400)   )Navigator Encounter Type: Telephone (11/04/18 1400)                     Patient Visit Type: Follow-up (11/04/18 1400) Treatment Phase: Other (11/04/18 1400) Barriers/Navigation Needs: Coordination of Care;Transportation (11/04/18 1400)                          Time Spent with Patient: 15 (11/04/18 1400)   Spoke with [name] today.  Transportation to [facility] may be provided by Lucianne Lei.  Possible candidate for disability pending final pathology. Reported transportation option to patient.  Pathology complete results pending.

## 2018-11-04 NOTE — Patient Instructions (Addendum)
Your procedure is scheduled on: 11/05/18 Report to [place]. To find out your arrival time please call [phone] between 1PM - 3PM on 11/04/18.  Remember: Instructions that are not followed completely may result in serious medical risk, up to and including death, or upon the discretion of your surgeon and anesthesiologist your surgery may need to be rescheduled.     _X__ 1. Do not eat food after midnight the night before your procedure.                 No gum chewing or hard candies. You may drink clear liquids up to 2 hours                 before you are scheduled to arrive for your surgery- DO not drink clear                 liquids within 2 hours of the start of your surgery.                 Clear Liquids include:  water, apple juice without pulp, clear carbohydrate                 drink such as Clearfast or Gatorade, Black Coffee or Tea (Do not add                 anything to coffee or tea). Diabetics water only  __X__2.  On the morning of surgery brush your teeth with toothpaste and water, you                 may rinse your mouth with mouthwash if you wish.  Do not swallow any              toothpaste of mouthwash.     _X__ 3.  No Alcohol for 24 hours before or after surgery.   _X__ 4.  Do Not Smoke or use e-cigarettes For 24 Hours Prior to Your Surgery.                 Do not use any chewable tobacco products for at least 6 hours prior to                 surgery.  ____  5.  Bring all medications with you on the day of surgery if instructed.   __X__  6.  Notify your doctor if there is any change in your medical condition      (cold, fever, infections).     Do not wear jewelry, make-up, hairpins, clips or nail polish. Do not wear lotions, powders, or perfumes.  Do not shave 48 hours prior to surgery. Men may shave face and neck. Do not bring valuables to the hospital.    [facility] is not responsible for any belongings or  valuables.  Contacts, dentures/partials or body piercings may not be worn into surgery. Bring a case for your contacts, glasses or hearing aids, a denture cup will be supplied. Leave your suitcase in the car. After surgery it may be brought to your room. For patients admitted to the hospital, discharge time is determined by your treatment team.   Patients discharged the day of surgery will not be allowed to drive home.   Please read over the following fact sheets that you were given:   MRSA Information  __X__ Take these medicines the morning of surgery with A SIP OF WATER:  1. none  2.   3.   4.  5.  6.  ____ Fleet Enema (as directed)   __X__ Use CHG Soap/SAGE wipes as directed  (instructed patient to get Dial soap and shower tonight and again tomorrow am)  ____ Use inhalers on the day of surgery  ____ Stop metformin/Janumet/Farxiga 2 days prior to surgery    ____ Take 1/2 of usual insulin dose the night before surgery. No insulin the morning          of surgery.   ____ Stop Blood Thinners Coumadin/Plavix/Xarelto/Pleta/Pradaxa/Eliquis/Effient/Aspirin  on   Or contact your Surgeon, Cardiologist or Medical Doctor regarding  ability to stop your blood thinners  __X__ Stop Anti-inflammatories 7 days before surgery such as Advil, Ibuprofen, Motrin,  BC or Goodies Powder, Naprosyn, Naproxen, Aleve, Aspirin    __X__ Stop all herbal supplements, fish oil or vitamin E until after surgery.    ____ Bring C-Pap to the hospital.      Telephone interview. Verbal instructions provided. Patient verbalized understanding./cn

## 2018-11-04 NOTE — H&P (View-Only) (Signed)
Patient ID: [name], female   DOB: 10/09/1985, 33 y.o.   MRN: [id number]  HPI [name] is a 33 y.o. 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  She reports that for about a year she felt a small knot on her right breast and has slowly grown in size.  About a month ago she went to her primary care physician because the area became painful and sore.  She also saw some skin changes.  Mammogram and ultrasound personal review showing evidence of a large breast mass on the right side measuring 4.3 x 3.8 cm some calcification.  She did undergo Biopsies of both right axillary and breast lesions consistent with metastatic  invasive mammary carcinoma . Markers are pending.  Now c/o of mild moderate constant pain rigth breast. She is able to perform more than 4 METS w/o SOB or C/P. Her menarche was at age 36 she has had 5 pregnancy did not breast-feed.  She was on birth control for 3 years.  No family history of breast cancer or ovarian cancer. She smokes about 1/4 pack a day HPI  Past Medical History:  Diagnosis Date  . Anemia     Past Surgical History:  Procedure Laterality Date  . BREAST BIOPSY Right 10/25/2018   Heart Clip, Pending path  . BREAST BIOPSY Right 10/25/2018   Lymph node biopsy, Hydromarker (butterfly), path pending  . CESAREAN SECTION    . CESAREAN SECTION N/A 04/24/2016   Procedure: REPEAT CESAREAN SECTION WITH BILATERAL TUBALIGATION;  Surgeon: Brayton Mars, MD;  Location: [facility] ORS;  Service: Obstetrics;  Laterality: N/A;  Baby Boy born @ 5 Apgars:8/9 Weight: 8lb 9oz    Family History  Problem Relation Age of Onset  . Hypertension Mother   . Stroke Father   . Cancer Maternal Grandmother   . Diabetes Maternal Grandfather   . Heart disease Maternal Grandfather   . Hypertension Maternal Grandfather     Social History Social History   Tobacco Use  . Smoking status: Current Every Day Smoker    Packs/day: 0.25     Years: 5.00    Pack years: 1.25    Types: Cigarettes  . Smokeless tobacco: Never Used  Substance Use Topics  . Alcohol use: No  . Drug use: No    No Known Allergies  Current Outpatient Medications  Medication Sig Dispense Refill  . acetaminophen (TYLENOL) 500 MG tablet Take 1,000 mg by mouth every 6 (six) hours as needed for mild pain, moderate pain, fever or headache.    . docusate sodium (COLACE) 100 MG capsule Take 1 capsule (100 mg total) by mouth 2 (two) times daily as needed. 30 capsule 2  . ferrous sulfate (FERROUSUL) 325 (65 FE) MG tablet Take 1 tablet (325 mg total) by mouth 2 (two) times daily. 60 tablet 1  . gabapentin (NEURONTIN) 300 MG capsule Take 1 capsule (300 mg total) by mouth 3 (three) times daily. 90 capsule 0   No current facility-administered medications for this visit.      Review of Systems Full ROS  was asked and was negative except for the information on the HPI  Physical Exam Blood pressure (!) 154/95, pulse 81, temperature 97.7 F (36.5 C), temperature source Temporal, resp. rate 16, height 5\' 5"  (1.651 m), weight 111 lb 9.6 oz (50.6 kg), last menstrual period 10/11/2018, SpO2 99 %. CONSTITUTIONAL: NAD EYES: Pupils are equal, round, and reactive to light, Sclera are non-icteric. EARS, NOSE, MOUTH AND  THROAT: The oropharynx is clear. The oral mucosa is pink and moist. Hearing is intact to voice. LYMPH NODES:  Lymph nodes in the neck are normal. RESPIRATORY:  Lungs are clear. There is normal respiratory effort, with equal breath sounds bilaterally, and without pathologic use of accessory muscles. CARDIOVASCULAR: Heart is regular without murmurs, gallops, or rubs. BREAST: There is a large breast mass located at 6:00 with skin ulcerations.  This mass is incorporating about 80% of the whole breast and it is attached to the chest wall.  This mass is firm.  She does also have bulky right lymphadenopathy consistent with metastatic disease.  There is some early  peau d'orange on edema within the right breast and right nipple GI: The abdomen is  soft, nontender, and nondistended. There are no palpable masses. There is no hepatosplenomegaly. There are normal bowel sounds in all quadrants. GU: Rectal deferred.   MUSCULOSKELETAL: Normal muscle strength and tone. No cyanosis or edema.   SKIN: Turgor is good and there are no pathologic skin lesions or ulcers. NEUROLOGIC: Motor and sensation is grossly normal. Cranial nerves are grossly intact. PSYCH:  Oriented to person, place and time. Affect is normal.  Data Reviewed  I have personally reviewed the patient's imaging, laboratory findings and medical records.    Assessment/Plan 33 year old female with a large invasive metastatic right breast cancer with chest wall involvement and skin involvement clinic.  Discussed with the patient in detail and first line of therapy will be chemotherapy.  I do not think that she will be a candidate for breast conserving surgery given the fact that her skin is ulcerated and she does have fixation to the chest wall.  I am not sure if we will eventually be able to perform mastectomy given the large nature of her disease and the difficulty that this large defect will leave. For now we will concentrate on upfront chemotherapy.  Port was discussed with patient detail.  Risk benefit and possible implications including but not limited to: Bleeding, infection pneumothorax and vascular injury.  She understands and wishes to proceed. D/W Dr. Janese Banks in detail.  Time spent with the patient was 60 minutes, with more than 50% of the time spent in face-to-face education, counseling and care coordination.     Caroleen Hamman, MD FACS General Surgeon 11/04/2018, 10:37 AM

## 2018-11-04 NOTE — Pre-Procedure Instructions (Signed)
CBC reviewed with Dr Rosey Bath. Ok to proceed.

## 2018-11-04 NOTE — Progress Notes (Signed)
Patient ID: [name], female   DOB: [birth year], 33 y.o.   MRN: [id number]  HPI [name] is a 33 y.o. 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  She reports that for about a year she felt a small knot on her right breast and has slowly grown in size.  About a month ago she went to her primary care physician because the area became painful and sore.  She also saw some skin changes.  Mammogram and ultrasound personal review showing evidence of a large breast mass on the right side measuring 4.3 x 3.8 cm some calcification.  She did undergo Biopsies of both right axillary and breast lesions consistent with metastatic  invasive mammary carcinoma . Markers are pending.  Now c/o of mild moderate constant pain rigth breast. She is able to perform more than 4 METS w/o SOB or C/P. Her menarche was at age 35 she has had 5 pregnancy did not breast-feed.  She was on birth control for 3 years.  No family history of breast cancer or ovarian cancer. She smokes about 1/4 pack a day HPI  Past Medical History:  Diagnosis Date  . Anemia     Past Surgical History:  Procedure Laterality Date  . BREAST BIOPSY Right 10/25/2018   Heart Clip, Pending path  . BREAST BIOPSY Right 10/25/2018   Lymph node biopsy, Hydromarker (butterfly), path pending  . CESAREAN SECTION    . CESAREAN SECTION N/A 04/24/2016   Procedure: REPEAT CESAREAN SECTION WITH BILATERAL TUBALIGATION;  Surgeon: Brayton Mars, MD;  Location: [facility] ORS;  Service: Obstetrics;  Laterality: N/A;  Baby Boy born @ 1 Apgars:8/9 Weight: 8lb 9oz    Family History  Problem Relation Age of Onset  . Hypertension Mother   . Stroke Father   . Cancer Maternal Grandmother   . Diabetes Maternal Grandfather   . Heart disease Maternal Grandfather   . Hypertension Maternal Grandfather     Social History Social History   Tobacco Use  . Smoking status: Current Every Day Smoker    Packs/day: 0.25     Years: 5.00    Pack years: 1.25    Types: Cigarettes  . Smokeless tobacco: Never Used  Substance Use Topics  . Alcohol use: No  . Drug use: No    No Known Allergies  Current Outpatient Medications  Medication Sig Dispense Refill  . acetaminophen (TYLENOL) 500 MG tablet Take 1,000 mg by mouth every 6 (six) hours as needed for mild pain, moderate pain, fever or headache.    . docusate sodium (COLACE) 100 MG capsule Take 1 capsule (100 mg total) by mouth 2 (two) times daily as needed. 30 capsule 2  . ferrous sulfate (FERROUSUL) 325 (65 FE) MG tablet Take 1 tablet (325 mg total) by mouth 2 (two) times daily. 60 tablet 1  . gabapentin (NEURONTIN) 300 MG capsule Take 1 capsule (300 mg total) by mouth 3 (three) times daily. 90 capsule 0   No current facility-administered medications for this visit.      Review of Systems Full ROS  was asked and was negative except for the information on the HPI  Physical Exam Blood pressure (!) 154/95, pulse 81, temperature 97.7 F (36.5 C), temperature source Temporal, resp. rate 16, height 5\' 5"  (1.651 m), weight 111 lb 9.6 oz (50.6 kg), last menstrual period 10/11/2018, SpO2 99 %. CONSTITUTIONAL: NAD EYES: Pupils are equal, round, and reactive to light, Sclera are non-icteric. EARS, NOSE, MOUTH AND  THROAT: The oropharynx is clear. The oral mucosa is pink and moist. Hearing is intact to voice. LYMPH NODES:  Lymph nodes in the neck are normal. RESPIRATORY:  Lungs are clear. There is normal respiratory effort, with equal breath sounds bilaterally, and without pathologic use of accessory muscles. CARDIOVASCULAR: Heart is regular without murmurs, gallops, or rubs. BREAST: There is a large breast mass located at 6:00 with skin ulcerations.  This mass is incorporating about 80% of the whole breast and it is attached to the chest wall.  This mass is firm.  She does also have bulky right lymphadenopathy consistent with metastatic disease.  There is some early  peau d'orange on edema within the right breast and right nipple GI: The abdomen is  soft, nontender, and nondistended. There are no palpable masses. There is no hepatosplenomegaly. There are normal bowel sounds in all quadrants. GU: Rectal deferred.   MUSCULOSKELETAL: Normal muscle strength and tone. No cyanosis or edema.   SKIN: Turgor is good and there are no pathologic skin lesions or ulcers. NEUROLOGIC: Motor and sensation is grossly normal. Cranial nerves are grossly intact. PSYCH:  Oriented to person, place and time. Affect is normal.  Data Reviewed  I have personally reviewed the patient's imaging, laboratory findings and medical records.    Assessment/Plan 33 year old female with a large invasive metastatic right breast cancer with chest wall involvement and skin involvement clinic.  Discussed with the patient in detail and first line of therapy will be chemotherapy.  I do not think that she will be a candidate for breast conserving surgery given the fact that her skin is ulcerated and she does have fixation to the chest wall.  I am not sure if we will eventually be able to perform mastectomy given the large nature of her disease and the difficulty that this large defect will leave. For now we will concentrate on upfront chemotherapy.  Port was discussed with patient detail.  Risk benefit and possible implications including but not limited to: Bleeding, infection pneumothorax and vascular injury.  She understands and wishes to proceed. D/W Dr. Janese Banks in detail.  Time spent with the patient was 60 minutes, with more than 50% of the time spent in face-to-face education, counseling and care coordination.     Caroleen Hamman, MD FACS General Surgeon 11/04/2018, 10:37 AM

## 2018-11-05 ENCOUNTER — Ambulatory Visit: Payer: Medicaid Other

## 2018-11-05 ENCOUNTER — Ambulatory Visit
Admission: RE | Admit: 2018-11-05 | Discharge: 2018-11-05 | Disposition: A | Discharge status: Home / Self Care | Payer: Medicaid Other | Source: Home / Self Care | Attending: Surgery | Admitting: Surgery

## 2018-11-05 ENCOUNTER — Other Ambulatory Visit: Payer: Self-pay

## 2018-11-05 ENCOUNTER — Ambulatory Visit: Payer: Medicaid Other | Admitting: Certified Registered"

## 2018-11-05 ENCOUNTER — Other Ambulatory Visit: Payer: Self-pay | Admitting: *Deleted

## 2018-11-05 ENCOUNTER — Encounter: Payer: Self-pay | Admitting: Oncology

## 2018-11-05 ENCOUNTER — Ambulatory Visit
Admission: RE | Admit: 2018-11-05 | Discharge: 2018-11-05 | Disposition: A | Discharge status: Home / Self Care | Payer: Medicaid Other | Attending: Surgery | Admitting: Surgery

## 2018-11-05 ENCOUNTER — Encounter: Payer: Self-pay | Admitting: *Deleted

## 2018-11-05 ENCOUNTER — Encounter
Admission: RE | Disposition: A | Discharge status: Home / Self Care | Payer: Self-pay | Source: Home / Self Care | Attending: Surgery

## 2018-11-05 DIAGNOSIS — D509 Iron deficiency anemia, unspecified: Secondary | ICD-10-CM | POA: Diagnosis not present

## 2018-11-05 DIAGNOSIS — N63 Unspecified lump in unspecified breast: Secondary | ICD-10-CM

## 2018-11-05 DIAGNOSIS — C792 Secondary malignant neoplasm of skin: Secondary | ICD-10-CM | POA: Diagnosis not present

## 2018-11-05 DIAGNOSIS — Z79899 Other long term (current) drug therapy: Secondary | ICD-10-CM | POA: Insufficient documentation

## 2018-11-05 DIAGNOSIS — C50911 Malignant neoplasm of unspecified site of right female breast: Secondary | ICD-10-CM | POA: Insufficient documentation

## 2018-11-05 DIAGNOSIS — F17210 Nicotine dependence, cigarettes, uncomplicated: Secondary | ICD-10-CM | POA: Insufficient documentation

## 2018-11-05 HISTORY — PX: PORTACATH PLACEMENT: SHX2246

## 2018-11-05 LAB — POCT PREGNANCY, URINE: Preg Test, Ur: NEGATIVE

## 2018-11-05 SURGERY — INSERTION, TUNNELED CENTRAL VENOUS DEVICE, WITH PORT
Anesthesia: General | Site: Chest | Laterality: Left

## 2018-11-05 MED ORDER — LIDOCAINE HCL (PF) 2 % IJ SOLN
INTRAMUSCULAR | Status: AC
  Filled 2018-11-05: qty 10

## 2018-11-05 MED ORDER — ONDANSETRON HCL 4 MG/2ML IJ SOLN
INTRAMUSCULAR | Status: AC
  Filled 2018-11-05: qty 2

## 2018-11-05 MED ORDER — SODIUM CHLORIDE 0.9 % IV SOLN
INTRAVENOUS | Status: DC | PRN
  Administered 2018-11-05: 5 mL via INTRAMUSCULAR

## 2018-11-05 MED ORDER — LIDOCAINE HCL (PF) 1 % IJ SOLN
INTRAMUSCULAR | Status: AC
  Filled 2018-11-05: qty 30

## 2018-11-05 MED ORDER — CHLORHEXIDINE GLUCONATE CLOTH 2 % EX PADS: 6.0000 | MEDICATED_PAD | Freq: Once | CUTANEOUS | Status: DC

## 2018-11-05 MED ORDER — HYDROCODONE-ACETAMINOPHEN 5-325 MG PO TABS
ORAL_TABLET | ORAL | Status: AC
  Administered 2018-11-05: 1
  Filled 2018-11-05: qty 1

## 2018-11-05 MED ORDER — FAMOTIDINE 20 MG PO TABS
20.0000 mg | ORAL_TABLET | Freq: Once | ORAL | Status: AC
  Administered 2018-11-05: 20 mg via ORAL

## 2018-11-05 MED ORDER — FAMOTIDINE 20 MG PO TABS
ORAL_TABLET | ORAL | Status: AC
  Filled 2018-11-05: qty 1

## 2018-11-05 MED ORDER — FENTANYL CITRATE (PF) 100 MCG/2ML IJ SOLN
INTRAMUSCULAR | Status: AC
  Filled 2018-11-05: qty 2

## 2018-11-05 MED ORDER — PROPOFOL 10 MG/ML IV BOLUS
INTRAVENOUS | Status: DC | PRN
  Administered 2018-11-05: 150 mg via INTRAVENOUS

## 2018-11-05 MED ORDER — LIDOCAINE HCL (PF) 1 % IJ SOLN
INTRAMUSCULAR | Status: DC | PRN
  Administered 2018-11-05: 15 mL

## 2018-11-05 MED ORDER — BUPIVACAINE-EPINEPHRINE (PF) 0.25% -1:200000 IJ SOLN
INTRAMUSCULAR | Status: DC | PRN
  Administered 2018-11-05: 15 mL

## 2018-11-05 MED ORDER — ONDANSETRON HCL 4 MG/2ML IJ SOLN
INTRAMUSCULAR | Status: DC | PRN
  Administered 2018-11-05: 4 mg via INTRAVENOUS

## 2018-11-05 MED ORDER — CEFAZOLIN SODIUM-DEXTROSE 2-4 GM/100ML-% IV SOLN
INTRAVENOUS | Status: AC
  Filled 2018-11-05: qty 100

## 2018-11-05 MED ORDER — PROPOFOL 10 MG/ML IV BOLUS
INTRAVENOUS | Status: AC
  Filled 2018-11-05: qty 40

## 2018-11-05 MED ORDER — LIDOCAINE HCL (CARDIAC) PF 100 MG/5ML IV SOSY
PREFILLED_SYRINGE | INTRAVENOUS | Status: DC | PRN
  Administered 2018-11-05: 60 mg via INTRAVENOUS

## 2018-11-05 MED ORDER — FENTANYL CITRATE (PF) 100 MCG/2ML IJ SOLN
INTRAMUSCULAR | Status: DC | PRN
  Administered 2018-11-05 (×2): 50 ug via INTRAVENOUS

## 2018-11-05 MED ORDER — MIDAZOLAM HCL 2 MG/2ML IJ SOLN
INTRAMUSCULAR | Status: AC
  Filled 2018-11-05: qty 2

## 2018-11-05 MED ORDER — HEPARIN SODIUM (PORCINE) 5000 UNIT/ML IJ SOLN
INTRAMUSCULAR | Status: AC
  Filled 2018-11-05: qty 1

## 2018-11-05 MED ORDER — BUPIVACAINE-EPINEPHRINE (PF) 0.25% -1:200000 IJ SOLN
INTRAMUSCULAR | Status: AC
  Filled 2018-11-05: qty 30

## 2018-11-05 MED ORDER — MIDAZOLAM HCL 2 MG/2ML IJ SOLN
INTRAMUSCULAR | Status: DC | PRN
  Administered 2018-11-05: 2 mg via INTRAVENOUS

## 2018-11-05 MED ORDER — DEXAMETHASONE SODIUM PHOSPHATE 10 MG/ML IJ SOLN
INTRAMUSCULAR | Status: AC
  Filled 2018-11-05: qty 1

## 2018-11-05 MED ORDER — ONDANSETRON HCL 4 MG/2ML IJ SOLN
4.0000 mg | Freq: Once | INTRAMUSCULAR | Status: AC | PRN
  Administered 2018-11-05: 11:00:00 4 mg via INTRAVENOUS

## 2018-11-05 MED ORDER — LIDOCAINE-PRILOCAINE 2.5-2.5 % EX CREA: 1.0000 "application " | TOPICAL_CREAM | CUTANEOUS | 0 refills | Status: DC

## 2018-11-05 MED ORDER — FENTANYL CITRATE (PF) 100 MCG/2ML IJ SOLN: 25.0000 ug | INTRAMUSCULAR | Status: DC | PRN

## 2018-11-05 MED ORDER — LACTATED RINGERS IV SOLN
INTRAVENOUS | Status: DC
  Administered 2018-11-05: 09:00:00 via INTRAVENOUS

## 2018-11-05 MED ORDER — DEXAMETHASONE SODIUM PHOSPHATE 10 MG/ML IJ SOLN
INTRAMUSCULAR | Status: DC | PRN
  Administered 2018-11-05: 10 mg via INTRAVENOUS

## 2018-11-05 MED ORDER — HYDROCODONE-ACETAMINOPHEN 5-325 MG PO TABS: 1.0000 | ORAL_TABLET | Freq: Four times a day (QID) | ORAL | 0 refills | Status: DC | PRN

## 2018-11-05 SURGICAL SUPPLY — 36 items
BAG DECANTER FOR FLEXI CONT (MISCELLANEOUS) ×3 IMPLANT
BLADE SURG SZ11 CARB STEEL (BLADE) ×3 IMPLANT
BOOT SUTURE AID YELLOW STND (SUTURE) ×3 IMPLANT
CANISTER SUCT 1200ML W/VALVE (MISCELLANEOUS) ×3 IMPLANT
CHLORAPREP W/TINT 26 (MISCELLANEOUS) ×3 IMPLANT
COVER LIGHT HANDLE STERIS (MISCELLANEOUS) ×6 IMPLANT
COVER WAND RF STERILE (DRAPES) ×3 IMPLANT
DERMABOND ADVANCED (GAUZE/BANDAGES/DRESSINGS) ×2
DERMABOND ADVANCED .7 DNX12 (GAUZE/BANDAGES/DRESSINGS) ×1 IMPLANT
DRAPE 3/4 80X56 (DRAPES) ×3 IMPLANT
DRAPE C-ARM XRAY 36X54 (DRAPES) ×6 IMPLANT
DRAPE INCISE IOBAN 66X45 STRL (DRAPES) ×3 IMPLANT
ELECT CAUTERY BLADE 6.4 (BLADE) ×3 IMPLANT
ELECT REM PT RETURN 9FT ADLT (ELECTROSURGICAL) ×3
ELECTRODE REM PT RTRN 9FT ADLT (ELECTROSURGICAL) ×1 IMPLANT
GEL ULTRASOUND 20GR AQUASONIC (MISCELLANEOUS) ×3 IMPLANT
GLOVE BIO SURGEON STRL SZ7 (GLOVE) ×9 IMPLANT
GOWN STRL REUS W/ TWL LRG LVL3 (GOWN DISPOSABLE) ×3 IMPLANT
GOWN STRL REUS W/TWL LRG LVL3 (GOWN DISPOSABLE) ×6
IV NS 500ML (IV SOLUTION) ×2
IV NS 500ML BAXH (IV SOLUTION) ×1 IMPLANT
KIT PORT POWER 8FR ISP CVUE (Port) ×3 IMPLANT
NEEDLE HYPO 22GX1.5 SAFETY (NEEDLE) ×3 IMPLANT
NS IRRIG 1000ML POUR BTL (IV SOLUTION) ×3 IMPLANT
PACK PORT-A-CATH (MISCELLANEOUS) ×3 IMPLANT
SPONGE LAP 18X18 RF (DISPOSABLE) ×3 IMPLANT
SUT MNCRL AB 4-0 PS2 18 (SUTURE) ×3 IMPLANT
SUT PROLENE 2-0 (SUTURE) ×2
SUT PROLENE 2-0 RB1 36X2 ARM (SUTURE) ×1
SUT VIC AB 3-0 SH 27 (SUTURE) ×2
SUT VIC AB 3-0 SH 27X BRD (SUTURE) ×1 IMPLANT
SUTURE PROLEN 2-0 RB1 36X2 ARM (SUTURE) ×1 IMPLANT
SYR 10ML LL (SYRINGE) ×3 IMPLANT
SYR 20ML LL LF (SYRINGE) ×3 IMPLANT
SYR 5ML LL (SYRINGE) ×3 IMPLANT
TOWEL OR 17X26 4PK STRL BLUE (TOWEL DISPOSABLE) ×3 IMPLANT

## 2018-11-05 NOTE — Anesthesia Procedure Notes (Addendum)
Procedure Name: LMA Insertion Date/Time: 11/05/2018 9:06 AM Performed by: Lavone Orn, CRNA Pre-anesthesia Checklist: Patient identified, Emergency Drugs available, Suction available, Patient being monitored and Timeout performed Patient Re-evaluated:Patient Re-evaluated prior to induction Oxygen Delivery Method: Circle system utilized Induction Type: IV induction Ventilation: Mask ventilation without difficulty LMA: LMA inserted LMA Size: 3.0 Number of attempts: 1 Placement Confirmation: positive ETCO2 and breath sounds checked- equal and bilateral Tube secured with: Tape Dental Injury: Teeth and Oropharynx as per pre-operative assessment

## 2018-11-05 NOTE — Anesthesia Preprocedure Evaluation (Signed)
Anesthesia Evaluation  Patient identified by MRN, date of birth, ID band Patient awake    Reviewed: Allergy & Precautions, H&P , NPO status , Patient's Chart, lab work & pertinent test results, reviewed documented beta blocker date and time   Airway Mallampati: II  TM Distance: >3 FB Neck ROM: full    Dental  (+) Teeth Intact   Pulmonary neg pulmonary ROS, Current Smoker and Patient abstained from smoking.,    Pulmonary exam normal        Cardiovascular Exercise Tolerance: Good negative cardio ROS Normal cardiovascular exam Rate:Normal     Neuro/Psych negative neurological ROS  negative psych ROS   GI/Hepatic negative GI ROS, Neg liver ROS,   Endo/Other  negative endocrine ROS  Renal/GU negative Renal ROS  negative genitourinary   Musculoskeletal   Abdominal   Peds  Hematology  (+) Blood dyscrasia, anemia ,   Anesthesia Other Findings   Reproductive/Obstetrics negative OB ROS                             Anesthesia Physical Anesthesia Plan  ASA: II  Anesthesia Plan: General LMA   Post-op Pain Management:    Induction:   PONV Risk Score and Plan:   Airway Management Planned:   Additional Equipment:   Intra-op Plan:   Post-operative Plan:   Informed Consent: I have reviewed the patients History and Physical, chart, labs and discussed the procedure including the risks, benefits and alternatives for the proposed anesthesia with the patient or authorized representative who has indicated his/her understanding and acceptance.       Plan Discussed with: CRNA  Anesthesia Plan Comments:         Anesthesia Quick Evaluation

## 2018-11-05 NOTE — Discharge Instructions (Signed)

## 2018-11-05 NOTE — Progress Notes (Signed)
Hematology/Oncology Consult note [facility] Telephone:([phone] [phone]  Patient Care Team: Bunnie Pion, FNP as PCP - General (Family Medicine) Rico Junker, RN as Registered Nurse Theodore Demark, RN as Registered Nurse   Name of the patient: [name]  [id number]  01/28/1986    Reason for referral-new diagnosis of right breast cancer   Referring physician- Hendricks Milo, FNP  Date of visit: 11/05/18   History of presenting illness-patient is a 33 year old African-American female who first noticed right breast mass about 6 months ago but did not seek medical attention at that time.  After repeated pursuading efforts  She went for a mammogram and ultrasound on 09/18/2018.  This showed a highly suspicious mass in the right breast 4.5 cm at the 5:30 position as well as a 9 mm satellite mass along with 5 abnormal lymph nodes.  However she did not pursue this with a biopsy until a month later and had a biopsy on 10/25/2018 which showed invasive mammary carcinoma grade 3 and at the time of my visit today her ER PR and HER-2 status is pending.  She also noticed that the mass had grown in size and invading through her right chest wall.  She is a single mom of 4 kids.  She is currently unemployed her parents live close by and help her out.  Patient currently reports that her right breast feels sore.  Her appetite is fair.  She denies any unintentional weight loss.  No family history of breast cancer.  Menarche at the age of 36.  She has used birth control for 3 years but is not currently taking any birth control.  ECOG PS- 0  Pain scale- 0   Review of systems- Review of Systems  Constitutional: Positive for malaise/fatigue. Negative for chills, fever and weight loss.  HENT: Negative for congestion, ear discharge and nosebleeds.   Eyes: Negative for blurred vision.  Respiratory: Negative for cough, hemoptysis, sputum production,  shortness of breath and wheezing.   Cardiovascular: Negative for chest pain, palpitations, orthopnea and claudication.  Gastrointestinal: Negative for abdominal pain, blood in stool, constipation, diarrhea, heartburn, melena, nausea and vomiting.  Genitourinary: Negative for dysuria, flank pain, frequency, hematuria and urgency.  Musculoskeletal: Negative for back pain, joint pain and myalgias.  Skin: Negative for rash.  Neurological: Negative for dizziness, tingling, focal weakness, seizures, weakness and headaches.  Endo/Heme/Allergies: Does not bruise/bleed easily.  Psychiatric/Behavioral: Negative for depression and suicidal ideas. The patient does not have insomnia.   Right breast soreness  No Known Allergies  Patient Active Problem List   Diagnosis Date Noted   Iron deficiency anemia 11/05/2018   Anemia 04/28/2016   Status post tubal ligation 04/28/2016   Status post repeat low transverse cesarean section 04/24/2016   Back pain 01/17/2016     Past Medical History:  Diagnosis Date   Anemia      Past Surgical History:  Procedure Laterality Date   BREAST BIOPSY Right 10/25/2018   Heart Clip, Pending path   BREAST BIOPSY Right 10/25/2018   Lymph node biopsy, Hydromarker (butterfly), path pending   CESAREAN SECTION     CESAREAN SECTION N/A 04/24/2016   Procedure: REPEAT CESAREAN SECTION WITH BILATERAL TUBALIGATION;  Surgeon: Brayton Mars, MD;  Location: [facility] ORS;  Service: Obstetrics;  Laterality: N/A;  Baby Boy born @ 48 Apgars:8/9 Weight: 8lb 9oz    Social History   Socioeconomic History   Marital status: Single    Spouse name:  Not on file   Number of children: Not on file   Years of education: Not on file   Highest education level: Not on file  Occupational History   Not on file  Social Needs   Financial resource strain: Not on file   Food insecurity    Worry: Not on file    Inability: Not on file   Transportation needs     Medical: Not on file    Non-medical: Not on file  Tobacco Use   Smoking status: Current Every Day Smoker    Packs/day: 0.25    Years: 5.00    Pack years: 1.25    Types: Cigarettes   Smokeless tobacco: Never Used  Substance and Sexual Activity   Alcohol use: No   Drug use: No   Sexual activity: Yes  Lifestyle   Physical activity    Days per week: Not on file    Minutes per session: Not on file   Stress: Not on file  Relationships   Social connections    Talks on phone: Not on file    Gets together: Not on file    Attends religious service: Not on file    Active member of club or organization: Not on file    Attends meetings of clubs or organizations: Not on file    Relationship status: Not on file   Intimate partner violence    Fear of current or ex partner: Not on file    Emotionally abused: Not on file    Physically abused: Not on file    Forced sexual activity: Not on file  Other Topics Concern   Not on file  Social History Narrative   Not on file     Family History  Problem Relation Age of Onset   Hypertension Mother    Stroke Father    Cancer Maternal Grandmother    Diabetes Maternal Grandfather    Heart disease Maternal Grandfather    Hypertension Maternal Grandfather     No current facility-administered medications for this visit.  No current outpatient medications on file.  Facility-Administered Medications Ordered in Other Visits:    ceFAZolin (ANCEF) 2-4 GM/100ML-% IVPB, , , ,    ceFAZolin (ANCEF) IVPB 2g/100 mL premix, 2 g, Intravenous, On Call to OR, Pabon, Diego F, MD   6 CHG cloth bath night before surgery, , , Once **AND** [START ON 11/06/2018] 6 CHG cloth bath AM of surgery, , , Once **AND** Chlorhexidine Gluconate Cloth 2 % PADS 6 each, 6 each, Topical, Once **AND** Chlorhexidine Gluconate Cloth 2 % PADS 6 each, 6 each, Topical, Once, Pabon, Diego F, MD   famotidine (PEPCID) 20 MG tablet, , , ,    lactated ringers infusion, ,  Intravenous, Continuous, Martha Clan, MD   Physical exam:  Vitals:   10/31/18 1041  BP: 134/80  Pulse: 86  Temp: (!) 97.5 F (36.4 C)  Weight: 111 lb 8 oz (50.6 kg)  Height: '5\' 5"'$  (1.651 m)   Physical Exam Constitutional:      Comments: Patient is thin and tearful today.  HENT:     Head: Normocephalic and atraumatic.  Eyes:     Pupils: Pupils are equal, round, and reactive to light.  Neck:     Musculoskeletal: Normal range of motion.  Cardiovascular:     Rate and Rhythm: Normal rate and regular rhythm.     Heart sounds: Normal heart sounds.  Pulmonary:     Effort: Pulmonary effort is normal.  Breath sounds: Normal breath sounds.  Abdominal:     General: Bowel sounds are normal.     Palpations: Abdomen is soft.  Skin:    General: Skin is warm and dry.  Neurological:     Mental Status: She is alert and oriented to person, place, and time.     Large hard palpable mass about 5 cm size in the right breast eroding through her skin in the inframammary region.  At least 4 distinct lymph nodes are palpable in the right axilla.  No palpable abnormalities in the left breast.   CMP Latest Ref Rng & Units 10/31/2018  Glucose 70 - 99 mg/dL 109(H)  BUN 6 - 20 mg/dL 14  Creatinine 0.44 - 1.00 mg/dL 0.73  Sodium 135 - 145 mmol/L 139  Potassium 3.5 - 5.1 mmol/L 3.9  Chloride 98 - 111 mmol/L 106  CO2 22 - 32 mmol/L 27  Calcium 8.9 - 10.3 mg/dL 9.5  Total Protein 6.5 - 8.1 g/dL 8.5(H)  Total Bilirubin 0.3 - 1.2 mg/dL 0.4  Alkaline Phos 38 - 126 U/L 57  AST 15 - 41 U/L 18  ALT 0 - 44 U/L 10   CBC Latest Ref Rng & Units 10/31/2018  WBC 4.0 - 10.5 K/uL 6.9  Hemoglobin 12.0 - 15.0 g/dL 8.9(L)  Hematocrit 36.0 - 46.0 % 29.9(L)  Platelets 150 - 400 K/uL 389    No images are attached to the encounter.  Nm Pet Image Initial (pi) Skull Base To Thigh  Result Date: 11/04/2018 CLINICAL DATA:  Initial treatment strategy for breast cancer. EXAM: NUCLEAR MEDICINE PET SKULL BASE TO  THIGH TECHNIQUE: 5.9 mCi F-18 FDG was injected intravenously. Full-ring PET imaging was performed from the skull base to thigh after the radiotracer. CT data was obtained and used for attenuation correction and anatomic localization. Fasting blood glucose: 94 mg/dl COMPARISON:  None. FINDINGS: Mediastinal blood pool activity: SUV max 1.9. Liver activity: SUV max NA NECK: No hypermetabolic lymph nodes in the neck. Incidental CT findings: none CHEST: Mass within the inferior right breast measures 3 cm and has an SUV max of 18.57. There is associated chest wall invasion with tumor involvement of the medial aspect of the pectoralis muscle extending to the pleural surface of the lung. Separate chest wall mass involving the medial chest wall adjacent to the sternum on the right measures approximately 4 cm with SUV max of 8.1. Index right axillary lymph node measures 1.4 cm within SUV max of 16.18. Index right supraclavicular node measures 1.2 cm with SUV max of 9.0. Bilateral FDG avid internal mammary lymph nodes are identified. Index left internal mammary lymph node has an SUV max of 3.7. Low right internal mammary lymph node identified adjacent to the xiphoid process has an SUV max of 4.15. Within the left prevascular region of the superior mediastinum there is an FDG avid lymph node anterior to the arch of the aorta which has an SUV max of 4.35. Incidental CT findings: There are 2 subcentimeter nodules noted within the left lower lobe which measure up to 4 mm, image 103/3. Too small to characterize by PET-CT. ABDOMEN/PELVIS: No abnormal hypermetabolic activity within the liver, pancreas, adrenal glands, or spleen. No hypermetabolic lymph nodes in the abdomen or pelvis. Incidental CT findings: None SKELETON: Multifocal FDG avid bone metastases are identified. Index lesion within the body of sternum without corresponding CT abnormality has an SUV max of 5.4. Index lesion within the T12 vertebra has an SUV max of 5.12.  Also without CT correlate. Hypermetabolic lesion within the right iliac bone has an SUV max of 8.43. Large FDG avid right acetabular lesion without corresponding CT abnormality has an SUV max of 13.03. Index lesion within the inferior right pubic rami has an SUV max of 8.36. Incidental CT findings: none IMPRESSION: 1. Multifocal FDG avid right breast lesions with chest wall involvement are identified compatible with primary breast carcinoma. 2. Hypermetabolic right axillary, right supraclavicular, superior mediastinal and bilateral internal mammary nodal metastasis. 3. Multifocal FDG avid bone metastasis is identified. 4. Small nodules in the left lower lobe, too small to reliably characterize by PET-CT. Electronically Signed   By: Kerby Moors M.D.   On: 11/04/2018 15:00   Ms Clip Placement Right  Result Date: 10/25/2018 CLINICAL DATA:  Post ultrasound-guided core needle biopsy of right breast 5:30 o'clock mass, and an abnormal right axillary lymph node. EXAM: DIAGNOSTIC RIGHT MAMMOGRAM POST ULTRASOUND BIOPSY COMPARISON:  Previous exam(s). FINDINGS: Mammographic images were obtained following ultrasound guided biopsy of right breast and right axilla. Two-view mammography demonstrates presence of heart shaped marker within the right breast mass of question, and HydroMARK within 1 of the abnormal right axillary lymph nodes. IMPRESSION: Successful placement of heart shaped marker within the right breast. Successful placement of HydroMARK within an abnormal right axillary lymph node. Final Assessment: Post Procedure Mammograms for Marker Placement Electronically Signed   By: Fidela Salisbury M.D.   On: 10/25/2018 13:14   [place] Rt Breast Bx W Loc Dev 1st Lesion Img Bx Spec US Guide  Addendum Date: 10/28/2018   ADDENDUM REPORT: 10/28/2018 13:32 ADDENDUM: PATHOLOGY revealed: A. BREAST, RIGHT AT 530 O'CLOCK, 2 CM FROM THE NIPPLE; INVASIVE MAMMARY CARCINOMA, NO SPECIAL TYPE. 10 mm in this sample. Grade 3. Ductal  carcinoma in situ: Present, high-grade with comedonecrosis. Lymphovascular invasion: Not identified. B. LYMPH NODE, RIGHT AXILLARY; - INVOLVED BY METASTATIC CARCINOMA, MEASURING AT LEAST 10 MM IN GREATEST EXTENT. - NO RESIDUAL LYMPHOID TISSUE IDENTIFIED IN THIS SAMPLE. Pathology results are CONCORDANT with imaging findings, per Dr. Fidela Salisbury. Pathology results were discussed with patient via telephone. The patient reported doing well after the biopsy with tenderness at the site. Post biopsy care instructions were reviewed and questions were answered. The patient was encouraged to call [facility] for any additional concerns. Recommendation: Surgical referral. Request for surgical referral was relayed to nurse navigators at [facility] by Electa Sniff RN on 10/28/2018. Addendum by Electa Sniff RN on 10/28/2018. Electronically Signed   By: Franki Cabot M.D.   On: 10/28/2018 13:32   Result Date: 10/28/2018 CLINICAL DATA:  Right breast 5:30 o'clock highly suspicious mass ulcerating through the skin and highly abnormal right axillary lymph node. EXAM: ULTRASOUND GUIDED RIGHT BREAST CORE NEEDLE BIOPSY COMPARISON:  Previous exam(s). FINDINGS: I met with the patient and we discussed the procedure of ultrasound-guided biopsy, including benefits and alternatives. We discussed the high likelihood of a successful procedure. We discussed the risks of the procedure, including infection, bleeding, tissue injury, clip migration, and inadequate sampling. Informed written consent was given. The usual time-out protocol was performed immediately prior to the procedure. Lesion quadrant: Lower inner quadrant and lower outer quadrant Using sterile technique and 1% Lidocaine as local anesthetic, under direct ultrasound visualization, a 14 gauge spring-loaded device was used to perform biopsy of right breast 5:30 o'clock mass using a lateral approach. At the conclusion of the procedure a heart shaped  tissue marker clip was deployed into the biopsy cavity. Next,  using sterile technique and 1% Lidocaine as local anesthetic, under direct ultrasound visualization, a 14 gauge spring-loaded device was used to perform biopsy of abnormal right axillary lymph node using a inferior approach. At the conclusion of the procedure a HydroMARK tissue marker clip was deployed into the biopsy cavity. Follow up 2 view mammogram was performed and dictated separately. IMPRESSION: Ultrasound guided biopsy of right breast 5:30 o'clock mass and an abnormal right axillary lymph node. No apparent complications. Electronically Signed: By: Fidela Salisbury M.D. On: 10/25/2018 13:05   [place] Rt Breast Bx W Loc Dev Ea Add Lesion Img Bx Spec US Guide  Addendum Date: 10/28/2018   ADDENDUM REPORT: 10/28/2018 13:32 ADDENDUM: PATHOLOGY revealed: A. BREAST, RIGHT AT 530 O'CLOCK, 2 CM FROM THE NIPPLE; INVASIVE MAMMARY CARCINOMA, NO SPECIAL TYPE. 10 mm in this sample. Grade 3. Ductal carcinoma in situ: Present, high-grade with comedonecrosis. Lymphovascular invasion: Not identified. B. LYMPH NODE, RIGHT AXILLARY; - INVOLVED BY METASTATIC CARCINOMA, MEASURING AT LEAST 10 MM IN GREATEST EXTENT. - NO RESIDUAL LYMPHOID TISSUE IDENTIFIED IN THIS SAMPLE. Pathology results are CONCORDANT with imaging findings, per Dr. Fidela Salisbury. Pathology results were discussed with patient via telephone. The patient reported doing well after the biopsy with tenderness at the site. Post biopsy care instructions were reviewed and questions were answered. The patient was encouraged to call [facility] for any additional concerns. Recommendation: Surgical referral. Request for surgical referral was relayed to nurse navigators at [facility] by Electa Sniff RN on 10/28/2018. Addendum by Electa Sniff RN on 10/28/2018. Electronically Signed   By: Franki Cabot M.D.   On: 10/28/2018 13:32   Result Date: 10/28/2018 CLINICAL DATA:  Right  breast 5:30 o'clock highly suspicious mass ulcerating through the skin and highly abnormal right axillary lymph node. EXAM: ULTRASOUND GUIDED RIGHT BREAST CORE NEEDLE BIOPSY COMPARISON:  Previous exam(s). FINDINGS: I met with the patient and we discussed the procedure of ultrasound-guided biopsy, including benefits and alternatives. We discussed the high likelihood of a successful procedure. We discussed the risks of the procedure, including infection, bleeding, tissue injury, clip migration, and inadequate sampling. Informed written consent was given. The usual time-out protocol was performed immediately prior to the procedure. Lesion quadrant: Lower inner quadrant and lower outer quadrant Using sterile technique and 1% Lidocaine as local anesthetic, under direct ultrasound visualization, a 14 gauge spring-loaded device was used to perform biopsy of right breast 5:30 o'clock mass using a lateral approach. At the conclusion of the procedure a heart shaped tissue marker clip was deployed into the biopsy cavity. Next, using sterile technique and 1% Lidocaine as local anesthetic, under direct ultrasound visualization, a 14 gauge spring-loaded device was used to perform biopsy of abnormal right axillary lymph node using a inferior approach. At the conclusion of the procedure a HydroMARK tissue marker clip was deployed into the biopsy cavity. Follow up 2 view mammogram was performed and dictated separately. IMPRESSION: Ultrasound guided biopsy of right breast 5:30 o'clock mass and an abnormal right axillary lymph node. No apparent complications. Electronically Signed: By: Fidela Salisbury M.D. On: 10/25/2018 13:05    Assessment and plan- Patient is a 33 y.o. female with newly diagnosed invasive mammary carcinoma of the right breast  I discussed the results of the pathology with the patient in detail which shows 2 distinct breast masses.  The larger breast mass is about 4.5 cm in size on her recent mammogram in  the satellite mass was about 9 mm in size.  There were 5 abnormal lymph nodes also noted in the axilla.  ER PR and HER-2 status was not back at the time of my visit today.  Given that it is grade 3 by histology I am concerned this may be triple negative or HER-2 positive.  Given the chest wall invasion as well as palpable axillary adenopathy, she does not have any evidence of metastatic disease she would need a mastectomy and axillary lymph node dissection down the line.  At this time I would recommend the following:  1.  PET CT scan to complete her staging work-up 2.  Based on labs including CBC with differential, CMP, ferritin and iron studies and tumor markers including CA 15-3 and CA 27-29 today.  3.  MRI of her bilateral breasts. 4.  Patient has surgical appointment early next week followed by port placement the following day. 5.  Regardless of her ER PR and HER-2 status given that she has locally advanced breast cancer-systemic chemotherapy would be warranted which would likely be anthracycline based and she would therefore need an echocardiogram prior to starting chemotherapy. 6.  She will also need to attend chemotherapy class  Intent of treatment with a palliative or curative will depend on the results of the PET CT scan.  I will tentatively arrange for a video visit in 3 days time to discuss the results of her final pathology and PET CT scan and further management  I have again emphasized to the patient that it would be very important for her to follow through her care and be compliant with treatment.  Unfortunately she has had a neglected breast mass and did not seek timely medical intervention   Total face to face encounter time for this patient visit was 40 min. >50% of the time was  spent in counseling and coordination of care.     Thank you for this kind referral and the opportunity to participate in the care of this patient   Visit Diagnosis 1. Malignant neoplasm of left female  breast, unspecified estrogen receptor status, unspecified site of breast ([place])   2. Iron deficiency anemia, unspecified iron deficiency anemia type     Dr. Randa Evens, MD, MPH [facility] [phone] 11/05/2018 9:09 AM

## 2018-11-05 NOTE — Progress Notes (Signed)
I got a message from [place] pre charting for pt' info and pt said that she needs emla cream. I called pt and she said that [facility] was sending her pain med and paying for it and maybe I could do the same with EMLA cream. [facility] called [facility] which [facility] the [facility] that is helping her. I faxed in and called in also the emla cream and that it was being paid for on [employer]. Pt understands and will pick both up in 1 hour. I let pt know to put cream on tomorrow after she gets done with chemo class. She is agreeable

## 2018-11-05 NOTE — Transfer of Care (Signed)
Immediate Anesthesia Transfer of Care Note  Patient: [name]  Procedure(s) Performed: INSERTION PORT-A-CATH (Left Chest)  Patient Location: PACU  Anesthesia Type:General  Level of Consciousness: drowsy  Airway & Oxygen Therapy: Patient Spontanous Breathing and Patient connected to nasal cannula oxygen  Post-op Assessment: Report given to RN and Post -op Vital signs reviewed and stable  Post vital signs: stable  Last Vitals:  Vitals Value Taken Time  BP 150/80 11/05/18 1006  Temp    Pulse 81 11/05/18 1008  Resp    SpO2 100 % 11/05/18 1008  Vitals shown include unvalidated device data.  Last Pain:  Vitals:   11/05/18 0815  TempSrc: Tympanic  PainSc: 0-No pain         Complications: No apparent anesthesia complications

## 2018-11-05 NOTE — Op Note (Addendum)
  Pre-operative Diagnosis:  Right Breast CA  Post-operative Diagnosis: same   Surgeon: Caroleen Hamman, MD FACS  Anesthesia: General LMA, marcaine .25% w epi and lidocaine 1%  Procedure: Left Subclavian  Port placement with fluoroscopy under U/S guidance  Findings: Good position of the tip of the catheter by fluoroscopy  Estimated Blood Loss: Minimal         Drains: None         Specimens: None       Complications: none            Procedure Details  The patient was seen again in the Holding Room. The benefits, complications, treatment options, and expected outcomes were discussed with the patient. The risks of bleeding, infection, recurrence of symptoms, failure to resolve symptoms,  thrombosis nonfunction breakage pneumothorax hemopneumothorax any of which could require chest tube or further surgery were reviewed with the patient.   The patient was taken to Operating Room, identified as [name] and the procedure verified.  A Time Out was held and the above information confirmed.  Prior to the induction of general anesthesia, antibiotic prophylaxis was administered. VTE prophylaxis was in place. Appropriate anesthesia was then administered and tolerated well. The chest was prepped with Chloraprep and draped in the sterile fashion. The patient was positioned in the supine position. Then the patient was placed in Trendelenburg position.  Patient was prepped and draped in sterile fashion and in a Trendelenburg position local anesthetic was infiltrated into the skin and subcutaneous tissues in the neck and anterior chest wall. The large bore needle was placed into the Left Suclavian vein under U/S guidance without difficulty and then the Seldinger wire was advanced. Fluoroscopy was utilized to confirm that the Seldinger wire was in the superior vena cava.  An incision was made and a port pocket developed with blunt and electrocautery dissection. The introducer dilator was placed  over the Seldinger wire the wire was removed. The previously flushed catheter was placed into the introducer dilator and the peel-away sheath was removed. The catheter length was confirmed and trimmed utilizing fluoroscopy for proper positioning. The catheter was then attached to the previously flushed port. The port was placed into the pocket. The port was held in with 2-0 Prolenes and flushed for function and heparin locked.  The wound was closed with interrupted 3-0 Vicryl followed by 4-0 subcuticular Monocryl sutures. Dermabond used to coat the skin  Patient was taken to the recovery room in stable condition where a postoperative chest film has been ordered.

## 2018-11-05 NOTE — Anesthesia Post-op Follow-up Note (Signed)
Anesthesia QCDR form completed.        

## 2018-11-05 NOTE — Interval H&P Note (Signed)
History and Physical Interval Note:  11/05/2018 8:32 AM  [name]  has presented today for surgery, with the diagnosis of BREAST CANCER.  The various methods of treatment have been discussed with the patient and family. After consideration of risks, benefits and other options for treatment, the patient has consented to  Procedure(s): INSERTION PORT-A-CATH (N/A) as a surgical intervention.  The patient's history has been reviewed, patient examined, no change in status, stable for surgery.  I have reviewed the patient's chart and labs.  Questions were answered to the patient's satisfaction.     [place]

## 2018-11-05 NOTE — Patient Instructions (Signed)
Doxorubicin injection What is this medicine? DOXORUBICIN (dox oh ROO bi sin) is a chemotherapy drug. It is used to treat many kinds of cancer like leukemia, lymphoma, neuroblastoma, sarcoma, and Wilms' tumor. It is also used to treat bladder cancer, breast cancer, lung cancer, ovarian cancer, stomach cancer, and thyroid cancer. This medicine may be used for other purposes; ask your health care provider or pharmacist if you have questions. COMMON BRAND NAME(S): Adriamycin, Adriamycin PFS, Adriamycin RDF, Rubex What should I tell my health care provider before I take this medicine? They need to know if you have any of these conditions:  heart disease  history of low blood counts caused by a medicine  liver disease  recent or ongoing radiation therapy  an unusual or allergic reaction to doxorubicin, other chemotherapy agents, other medicines, foods, dyes, or preservatives  pregnant or trying to get pregnant  breast-feeding How should I use this medicine? This drug is given as an infusion into a vein. It is administered in a hospital or clinic by a specially trained health care professional. If you have pain, swelling, burning or any unusual feeling around the site of your injection, tell your health care professional right away. Talk to your pediatrician regarding the use of this medicine in children. Special care may be needed. Overdosage: If you think you have taken too much of this medicine contact a poison control center or emergency room at once. NOTE: This medicine is only for you. Do not share this medicine with others. What if I miss a dose? It is important not to miss your dose. Call your doctor or health care professional if you are unable to keep an appointment. What may interact with this medicine? This medicine may interact with the following medications:  6-mercaptopurine  paclitaxel  phenytoin  St. John's Wort  trastuzumab  verapamil This list may not describe  all possible interactions. Give your health care provider a list of all the medicines, herbs, non-prescription drugs, or dietary supplements you use. Also tell them if you smoke, drink alcohol, or use illegal drugs. Some items may interact with your medicine. What should I watch for while using this medicine? This drug may make you feel generally unwell. This is not uncommon, as chemotherapy can affect healthy cells as well as cancer cells. Report any side effects. Continue your course of treatment even though you feel ill unless your doctor tells you to stop. There is a maximum amount of this medicine you should receive throughout your life. The amount depends on the medical condition being treated and your overall health. Your doctor will watch how much of this medicine you receive in your lifetime. Tell your doctor if you have taken this medicine before. You may need blood work done while you are taking this medicine. Your urine may turn red for a few days after your dose. This is not blood. If your urine is dark or brown, call your doctor. In some cases, you may be given additional medicines to help with side effects. Follow all directions for their use. Call your doctor or health care professional for advice if you get a fever, chills or sore throat, or other symptoms of a cold or flu. Do not treat yourself. This drug decreases your body's ability to fight infections. Try to avoid being around people who are sick. This medicine may increase your risk to bruise or bleed. Call your doctor or health care professional if you notice any unusual bleeding. Talk to your doctor   about your risk of cancer. You may be more at risk for certain types of cancers if you take this medicine. Do not become pregnant while taking this medicine or for 6 months after stopping it. Women should inform their doctor if they wish to become pregnant or think they might be pregnant. Men should not father a child while taking this  medicine and for 6 months after stopping it. There is a potential for serious side effects to an unborn child. Talk to your health care professional or pharmacist for more information. Do not breast-feed an infant while taking this medicine. This medicine has caused ovarian failure in some women and reduced sperm counts in some men This medicine may interfere with the ability to have a child. Talk with your doctor or health care professional if you are concerned about your fertility. This medicine may cause a decrease in Co-Enzyme Q-10. You should make sure that you get enough Co-Enzyme Q-10 while you are taking this medicine. Discuss the foods you eat and the vitamins you take with your health care professional. What side effects may I notice from receiving this medicine? Side effects that you should report to your doctor or health care professional as soon as possible:  allergic reactions like skin rash, itching or hives, swelling of the face, lips, or tongue  breathing problems  chest pain  fast or irregular heartbeat  low blood counts - this medicine may decrease the number of white blood cells, red blood cells and platelets. You may be at increased risk for infections and bleeding.  pain, redness, or irritation at site where injected  signs of infection - fever or chills, cough, sore throat, pain or difficulty passing urine  signs of decreased platelets or bleeding - bruising, pinpoint red spots on the skin, black, tarry stools, blood in the urine  swelling of the ankles, feet, hands  tiredness  weakness Side effects that usually do not require medical attention (report to your doctor or health care professional if they continue or are bothersome):  diarrhea  hair loss  mouth sores  nail discoloration or damage  nausea  red colored urine  vomiting This list may not describe all possible side effects. Call your doctor for medical advice about side effects. You may report  side effects to FDA at 1-800-FDA-1088. Where should I keep my medicine? This drug is given in a hospital or clinic and will not be stored at home. NOTE: This sheet is a summary. It may not cover all possible information. If you have questions about this medicine, talk to your doctor, pharmacist, or health care provider.  2020 Elsevier/Gold Standard (2016-10-04 11:01:26) Cyclophosphamide injection What is this medicine? CYCLOPHOSPHAMIDE (sye kloe FOSS fa mide) is a chemotherapy drug. It slows the growth of cancer cells. This medicine is used to treat many types of cancer like lymphoma, myeloma, leukemia, breast cancer, and ovarian cancer, to name a few. This medicine may be used for other purposes; ask your health care provider or pharmacist if you have questions. COMMON BRAND NAME(S): Cytoxan, Neosar What should I tell my health care provider before I take this medicine? They need to know if you have any of these conditions:  blood disorders  history of other chemotherapy  infection  kidney disease  liver disease  recent or ongoing radiation therapy  tumors in the bone marrow  an unusual or allergic reaction to cyclophosphamide, other chemotherapy, other medicines, foods, dyes, or preservatives  pregnant or trying to get  pregnant  breast-feeding How should I use this medicine? This drug is usually given as an injection into a vein or muscle or by infusion into a vein. It is administered in a hospital or clinic by a specially trained health care professional. Talk to your pediatrician regarding the use of this medicine in children. Special care may be needed. Overdosage: If you think you have taken too much of this medicine contact a poison control center or emergency room at once. NOTE: This medicine is only for you. Do not share this medicine with others. What if I miss a dose? It is important not to miss your dose. Call your doctor or health care professional if you are  unable to keep an appointment. What may interact with this medicine? This medicine may interact with the following medications:  amiodarone  amphotericin B  azathioprine  certain antiviral medicines for HIV or AIDS such as protease inhibitors (e.g., indinavir, ritonavir) and zidovudine  certain blood pressure medications such as benazepril, captopril, enalapril, fosinopril, lisinopril, moexipril, monopril, perindopril, quinapril, ramipril, trandolapril  certain cancer medications such as anthracyclines (e.g., daunorubicin, doxorubicin), busulfan, cytarabine, paclitaxel, pentostatin, tamoxifen, trastuzumab  certain diuretics such as chlorothiazide, chlorthalidone, hydrochlorothiazide, indapamide, metolazone  certain medicines that treat or prevent blood clots like warfarin  certain muscle relaxants such as succinylcholine  cyclosporine  etanercept  indomethacin  medicines to increase blood counts like filgrastim, pegfilgrastim, sargramostim  medicines used as general anesthesia  metronidazole  natalizumab This list may not describe all possible interactions. Give your health care provider a list of all the medicines, herbs, non-prescription drugs, or dietary supplements you use. Also tell them if you smoke, drink alcohol, or use illegal drugs. Some items may interact with your medicine. What should I watch for while using this medicine? Visit your doctor for checks on your progress. This drug may make you feel generally unwell. This is not uncommon, as chemotherapy can affect healthy cells as well as cancer cells. Report any side effects. Continue your course of treatment even though you feel ill unless your doctor tells you to stop. Drink water or other fluids as directed. Urinate often, even at night. In some cases, you may be given additional medicines to help with side effects. Follow all directions for their use. Call your doctor or health care professional for advice if  you get a fever, chills or sore throat, or other symptoms of a cold or flu. Do not treat yourself. This drug decreases your body's ability to fight infections. Try to avoid being around people who are sick. This medicine may increase your risk to bruise or bleed. Call your doctor or health care professional if you notice any unusual bleeding. Be careful brushing and flossing your teeth or using a toothpick because you may get an infection or bleed more easily. If you have any dental work done, tell your dentist you are receiving this medicine. You may get drowsy or dizzy. Do not drive, use machinery, or do anything that needs mental alertness until you know how this medicine affects you. Do not become pregnant while taking this medicine or for 1 year after stopping it. Women should inform their doctor if they wish to become pregnant or think they might be pregnant. Men should not father a child while taking this medicine and for 4 months after stopping it. There is a potential for serious side effects to an unborn child. Talk to your health care professional or pharmacist for more information. Do not breast-feed  an infant while taking this medicine. This medicine may interfere with the ability to have a child. This medicine has caused ovarian failure in some women. This medicine has caused reduced sperm counts in some men. You should talk with your doctor or health care professional if you are concerned about your fertility. If you are going to have surgery, tell your doctor or health care professional that you have taken this medicine. What side effects may I notice from receiving this medicine? Side effects that you should report to your doctor or health care professional as soon as possible:  allergic reactions like skin rash, itching or hives, swelling of the face, lips, or tongue  low blood counts - this medicine may decrease the number of white blood cells, red blood cells and platelets. You may be  at increased risk for infections and bleeding.  signs of infection - fever or chills, cough, sore throat, pain or difficulty passing urine  signs of decreased platelets or bleeding - bruising, pinpoint red spots on the skin, black, tarry stools, blood in the urine  signs of decreased red blood cells - unusually weak or tired, fainting spells, lightheadedness  breathing problems  dark urine  dizziness  palpitations  swelling of the ankles, feet, hands  trouble passing urine or change in the amount of urine  weight gain  yellowing of the eyes or skin Side effects that usually do not require medical attention (report to your doctor or health care professional if they continue or are bothersome):  changes in nail or skin color  hair loss  missed menstrual periods  mouth sores  nausea, vomiting This list may not describe all possible side effects. Call your doctor for medical advice about side effects. You may report side effects to FDA at 1-800-FDA-1088. Where should I keep my medicine? This drug is given in a hospital or clinic and will not be stored at home. NOTE: This sheet is a summary. It may not cover all possible information. If you have questions about this medicine, talk to your doctor, pharmacist, or health care provider.  2020 Elsevier/Gold Standard (2012-01-05 16:22:58) Paclitaxel injection What is this medicine? PACLITAXEL (PAK li TAX el) is a chemotherapy drug. It targets fast dividing cells, like cancer cells, and causes these cells to die. This medicine is used to treat ovarian cancer, breast cancer, lung cancer, Kaposi's sarcoma, and other cancers. This medicine may be used for other purposes; ask your health care provider or pharmacist if you have questions. COMMON BRAND NAME(S): Onxol, Taxol What should I tell my health care provider before I take this medicine? They need to know if you have any of these conditions:  history of irregular  heartbeat  liver disease  low blood counts, like low white cell, platelet, or red cell counts  lung or breathing disease, like asthma  tingling of the fingers or toes, or other nerve disorder  an unusual or allergic reaction to paclitaxel, alcohol, polyoxyethylated castor oil, other chemotherapy, other medicines, foods, dyes, or preservatives  pregnant or trying to get pregnant  breast-feeding How should I use this medicine? This drug is given as an infusion into a vein. It is administered in a hospital or clinic by a specially trained health care professional. Talk to your pediatrician regarding the use of this medicine in children. Special care may be needed. Overdosage: If you think you have taken too much of this medicine contact a poison control center or emergency room at once. NOTE: This  medicine is only for you. Do not share this medicine with others. What if I miss a dose? It is important not to miss your dose. Call your doctor or health care professional if you are unable to keep an appointment. What may interact with this medicine? Do not take this medicine with any of the following medications:  disulfiram  metronidazole This medicine may also interact with the following medications:  antiviral medicines for hepatitis, HIV or AIDS  certain antibiotics like erythromycin and clarithromycin  certain medicines for fungal infections like ketoconazole and itraconazole  certain medicines for seizures like carbamazepine, phenobarbital, phenytoin  gemfibrozil  nefazodone  rifampin  St. John's wort This list may not describe all possible interactions. Give your health care provider a list of all the medicines, herbs, non-prescription drugs, or dietary supplements you use. Also tell them if you smoke, drink alcohol, or use illegal drugs. Some items may interact with your medicine. What should I watch for while using this medicine? Your condition will be monitored  carefully while you are receiving this medicine. You will need important blood work done while you are taking this medicine. This medicine can cause serious allergic reactions. To reduce your risk you will need to take other medicine(s) before treatment with this medicine. If you experience allergic reactions like skin rash, itching or hives, swelling of the face, lips, or tongue, tell your doctor or health care professional right away. In some cases, you may be given additional medicines to help with side effects. Follow all directions for their use. This drug may make you feel generally unwell. This is not uncommon, as chemotherapy can affect healthy cells as well as cancer cells. Report any side effects. Continue your course of treatment even though you feel ill unless your doctor tells you to stop. Call your doctor or health care professional for advice if you get a fever, chills or sore throat, or other symptoms of a cold or flu. Do not treat yourself. This drug decreases your body's ability to fight infections. Try to avoid being around people who are sick. This medicine may increase your risk to bruise or bleed. Call your doctor or health care professional if you notice any unusual bleeding. Be careful brushing and flossing your teeth or using a toothpick because you may get an infection or bleed more easily. If you have any dental work done, tell your dentist you are receiving this medicine. Avoid taking products that contain aspirin, acetaminophen, ibuprofen, naproxen, or ketoprofen unless instructed by your doctor. These medicines may hide a fever. Do not become pregnant while taking this medicine. Women should inform their doctor if they wish to become pregnant or think they might be pregnant. There is a potential for serious side effects to an unborn child. Talk to your health care professional or pharmacist for more information. Do not breast-feed an infant while taking this medicine. Men are  advised not to father a child while receiving this medicine. This product may contain alcohol. Ask your pharmacist or healthcare provider if this medicine contains alcohol. Be sure to tell all healthcare providers you are taking this medicine. Certain medicines, like metronidazole and disulfiram, can cause an unpleasant reaction when taken with alcohol. The reaction includes flushing, headache, nausea, vomiting, sweating, and increased thirst. The reaction can last from 30 minutes to several hours. What side effects may I notice from receiving this medicine? Side effects that you should report to your doctor or health care professional as soon as possible:  allergic reactions like skin rash, itching or hives, swelling of the face, lips, or tongue  breathing problems  changes in vision  fast, irregular heartbeat  high or low blood pressure  mouth sores  pain, tingling, numbness in the hands or feet  signs of decreased platelets or bleeding - bruising, pinpoint red spots on the skin, black, tarry stools, blood in the urine  signs of decreased red blood cells - unusually weak or tired, feeling faint or lightheaded, falls  signs of infection - fever or chills, cough, sore throat, pain or difficulty passing urine  signs and symptoms of liver injury like dark yellow or brown urine; general ill feeling or flu-like symptoms; light-colored stools; loss of appetite; nausea; right upper belly pain; unusually weak or tired; yellowing of the eyes or skin  swelling of the ankles, feet, hands  unusually slow heartbeat Side effects that usually do not require medical attention (report to your doctor or health care professional if they continue or are bothersome):  diarrhea  hair loss  loss of appetite  muscle or joint pain  nausea, vomiting  pain, redness, or irritation at site where injected  tiredness This list may not describe all possible side effects. Call your doctor for medical  advice about side effects. You may report side effects to FDA at 1-800-FDA-1088. Where should I keep my medicine? This drug is given in a hospital or clinic and will not be stored at home. NOTE: This sheet is a summary. It may not cover all possible information. If you have questions about this medicine, talk to your doctor, pharmacist, or health care provider.  2020 [employer]vier/Gold Standard (2016-10-24 13:14:55)

## 2018-11-05 NOTE — Progress Notes (Signed)
Patient stated that she had been doing well. Patient wanted to know if an EMLA cream could be sent to her pharmacy.

## 2018-11-06 ENCOUNTER — Inpatient Hospital Stay: Payer: Medicaid Other | Admitting: Oncology

## 2018-11-06 ENCOUNTER — Other Ambulatory Visit: Payer: Self-pay

## 2018-11-06 ENCOUNTER — Inpatient Hospital Stay: Payer: Medicaid Other | Attending: Oncology

## 2018-11-06 ENCOUNTER — Inpatient Hospital Stay: Payer: Medicaid Other

## 2018-11-06 VITALS — BP 133/72 | HR 72 | Temp 97.6°F | Resp 18

## 2018-11-06 DIAGNOSIS — Z171 Estrogen receptor negative status [ER-]: Secondary | ICD-10-CM | POA: Insufficient documentation

## 2018-11-06 DIAGNOSIS — Z79899 Other long term (current) drug therapy: Secondary | ICD-10-CM | POA: Insufficient documentation

## 2018-11-06 DIAGNOSIS — C50311 Malignant neoplasm of lower-inner quadrant of right female breast: Secondary | ICD-10-CM | POA: Insufficient documentation

## 2018-11-06 DIAGNOSIS — K5900 Constipation, unspecified: Secondary | ICD-10-CM | POA: Insufficient documentation

## 2018-11-06 DIAGNOSIS — C7951 Secondary malignant neoplasm of bone: Secondary | ICD-10-CM | POA: Diagnosis present

## 2018-11-06 DIAGNOSIS — F17210 Nicotine dependence, cigarettes, uncomplicated: Secondary | ICD-10-CM | POA: Insufficient documentation

## 2018-11-06 DIAGNOSIS — D509 Iron deficiency anemia, unspecified: Secondary | ICD-10-CM

## 2018-11-06 DIAGNOSIS — Z7689 Persons encountering health services in other specified circumstances: Secondary | ICD-10-CM | POA: Insufficient documentation

## 2018-11-06 DIAGNOSIS — G893 Neoplasm related pain (acute) (chronic): Secondary | ICD-10-CM | POA: Insufficient documentation

## 2018-11-06 DIAGNOSIS — R918 Other nonspecific abnormal finding of lung field: Secondary | ICD-10-CM | POA: Insufficient documentation

## 2018-11-06 MED ORDER — SODIUM CHLORIDE 0.9 % IV SOLN
510.0000 mg | Freq: Once | INTRAVENOUS | Status: AC
  Administered 2018-11-06: 510 mg via INTRAVENOUS
  Filled 2018-11-06: qty 17

## 2018-11-06 MED ORDER — SODIUM CHLORIDE 0.9 % IV SOLN
Freq: Once | INTRAVENOUS | Status: AC
  Administered 2018-11-06: 14:00:00 via INTRAVENOUS
  Filled 2018-11-06: qty 250

## 2018-11-06 NOTE — Anesthesia Postprocedure Evaluation (Signed)
Anesthesia Post Note  Patient: [name]  Procedure(s) Performed: INSERTION PORT-A-CATH (Left Chest)  Patient location during evaluation: PACU Anesthesia Type: General Level of consciousness: awake and alert Pain management: pain level controlled Vital Signs Assessment: post-procedure vital signs reviewed and stable Respiratory status: spontaneous breathing, nonlabored ventilation, respiratory function stable and patient connected to nasal cannula oxygen Cardiovascular status: blood pressure returned to baseline and stable Postop Assessment: no apparent nausea or vomiting Anesthetic complications: no     Last Vitals:  Vitals:   11/05/18 1048 11/05/18 1126  BP: (!) 159/82 131/84  Pulse: 66 74  Resp: 18 18  Temp: (!) 36.4 C   SpO2: 100% 99%    Last Pain:  Vitals:   11/05/18 1126  TempSrc:   PainSc: 4                  Molli Barrows

## 2018-11-06 NOTE — Progress Notes (Deleted)
[place]  [phone] Fax:([phone]) [phone]  Patient Care Team: Bunnie Pion, FNP as PCP - General (Family Medicine) Rico Junker, RN as Registered Nurse Theodore Demark, RN as Registered Nurse   Name of the patient: [name]  [id number]  Sep 21, 1985   Date of visit: 11/06/18  Diagnosis- ***  Chief complaint/Reason for visit- Initial Meeting for [facility], preparing for starting chemotherapy  Heme/Onc history:  Oncology History   No history exists.    Interval history-  *** who presents to chemo care clinic today for initial meeting in preparation for starting chemotherapy. I introduced the chemo care clinic and we discussed that the role of the clinic is to assist those who are at an increased risk of emergency room visits and/or complications during the course of chemotherapy treatment. We discussed that the increased risk takes into account factors such as age, performance status, and co-morbidities. We also discussed that for some, this might include barriers to care such as not having a primary care provider, lack of insurance/transportation, or not being able to afford medications. We discussed that the goal of the program is to help prevent unplanned ER visits and help reduce complications during chemotherapy. We do this by discussing specific risk factors to each individual and identifying ways that we can help improve these risk factors and reduce barriers to care.   ECOG FS:{CHL ONC [id number]}  Review of systems- ROS   Current treatment-   No Known Allergies  Past Medical History:  Diagnosis Date   Anemia     Past Surgical History:  Procedure Laterality Date   BREAST BIOPSY Right 10/25/2018   Heart Clip, Pending path   BREAST BIOPSY Right 10/25/2018   Lymph node biopsy, Hydromarker (butterfly), path pending   CESAREAN SECTION     CESAREAN SECTION N/A 04/24/2016   Procedure: REPEAT CESAREAN SECTION WITH BILATERAL TUBALIGATION;  Surgeon: Brayton Mars, MD;  Location: [facility] ORS;  Service: Obstetrics;  Laterality: N/A;  Baby Boy born @ 16 Apgars:8/9 Weight: 8lb 9oz   PORTACATH PLACEMENT Left 11/05/2018   Procedure: INSERTION PORT-A-CATH;  Surgeon: Jules Husbands, MD;  Location: [facility] ORS;  Service: General;  Laterality: Left;    Social History   Socioeconomic History   Marital status: Single    Spouse name: Not on file   Number of children: Not on file   Years of education: Not on file   Highest education level: Not on file  Occupational History   Not on file  Social Needs   Financial resource strain: Not on file   Food insecurity    Worry: Not on file    Inability: Not on file   Transportation needs    Medical: Not on file    Non-medical: Not on file  Tobacco Use   Smoking status: Current Every Day Smoker    Packs/day: 0.25    Years: 5.00    Pack years: 1.25    Types: Cigarettes   Smokeless tobacco: Never Used  Substance and Sexual Activity   Alcohol use: No   Drug use: No   Sexual activity: Yes  Lifestyle   Physical activity    Days per week: Not on file    Minutes per session: Not on file   Stress: Not on file  Relationships   Social connections    Talks on phone: Not on file    Gets together: Not on file  Attends religious service: Not on file    Active member of club or organization: Not on file    Attends meetings of clubs or organizations: Not on file    Relationship status: Not on file   Intimate partner violence    Fear of current or ex partner: Not on file    Emotionally abused: Not on file    Physically abused: Not on file    Forced sexual activity: Not on file  Other Topics Concern   Not on file  Social History Narrative   Not on file    Family History  Problem Relation Age of Onset   Hypertension Mother    Stroke Father    Cancer Maternal Grandmother    Diabetes Maternal  Grandfather    Heart disease Maternal Grandfather    Hypertension Maternal Grandfather      Current Outpatient Medications:    gabapentin (NEURONTIN) 300 MG capsule, Take 1 capsule (300 mg total) by mouth 3 (three) times daily., Disp: 90 capsule, Rfl: 0   HYDROcodone-acetaminophen (NORCO/VICODIN) 5-325 MG tablet, Take 1 tablet by mouth every 6 (six) hours as needed for moderate pain., Disp: 15 tablet, Rfl: 0   lidocaine-prilocaine (EMLA) cream, Apply 1 application topically as directed. Apply small amount over port site 1 hour before treatments, cover the cream with saran wrap to protect your clothing, Disp: 30 g, Rfl: 0  Physical exam: There were no vitals filed for this visit. Physical Exam   CMP Latest Ref Rng & Units 10/31/2018  Glucose 70 - 99 mg/dL 109(H)  BUN 6 - 20 mg/dL 14  Creatinine 0.44 - 1.00 mg/dL 0.73  Sodium 135 - 145 mmol/L 139  Potassium 3.5 - 5.1 mmol/L 3.9  Chloride 98 - 111 mmol/L 106  CO2 22 - 32 mmol/L 27  Calcium 8.9 - 10.3 mg/dL 9.5  Total Protein 6.5 - 8.1 g/dL 8.5(H)  Total Bilirubin 0.3 - 1.2 mg/dL 0.4  Alkaline Phos 38 - 126 U/L 57  AST 15 - 41 U/L 18  ALT 0 - 44 U/L 10   CBC Latest Ref Rng & Units 10/31/2018  WBC 4.0 - 10.5 K/uL 6.9  Hemoglobin 12.0 - 15.0 g/dL 8.9(L)  Hematocrit 36.0 - 46.0 % 29.9(L)  Platelets 150 - 400 K/uL 389    No images are attached to the encounter.  Dg Chest 1 View  Result Date: 11/05/2018 CLINICAL DATA:  Breast cancer.  Port insertion. EXAM: CHEST  1 VIEW COMPARISON:  None. FINDINGS: Heart size is normal. Power port inserted on the left via a subclavian approach with the tip at the SVC RA junction. No pneumothorax. Lungs remain clear. No bone abnormality. IMPRESSION: Power port tip at the SVC RA junction. No pneumothorax. No active disease. Electronically Signed   By: Nelson Chimes M.D.   On: 11/05/2018 10:25   Nm Pet Image Initial (pi) Skull Base To Thigh  Result Date: 11/04/2018 CLINICAL DATA:  Initial treatment  strategy for breast cancer. EXAM: NUCLEAR MEDICINE PET SKULL BASE TO THIGH TECHNIQUE: 5.9 mCi F-18 FDG was injected intravenously. Full-ring PET imaging was performed from the skull base to thigh after the radiotracer. CT data was obtained and used for attenuation correction and anatomic localization. Fasting blood glucose: 94 mg/dl COMPARISON:  None. FINDINGS: Mediastinal blood pool activity: SUV max 1.9. Liver activity: SUV max NA NECK: No hypermetabolic lymph nodes in the neck. Incidental CT findings: none CHEST: Mass within the inferior right breast measures 3 cm and has an SUV  max of 18.57. There is associated chest wall invasion with tumor involvement of the medial aspect of the pectoralis muscle extending to the pleural surface of the lung. Separate chest wall mass involving the medial chest wall adjacent to the sternum on the right measures approximately 4 cm with SUV max of 8.1. Index right axillary lymph node measures 1.4 cm within SUV max of 16.18. Index right supraclavicular node measures 1.2 cm with SUV max of 9.0. Bilateral FDG avid internal mammary lymph nodes are identified. Index left internal mammary lymph node has an SUV max of 3.7. Low right internal mammary lymph node identified adjacent to the xiphoid process has an SUV max of 4.15. Within the left prevascular region of the superior mediastinum there is an FDG avid lymph node anterior to the arch of the aorta which has an SUV max of 4.35. Incidental CT findings: There are 2 subcentimeter nodules noted within the left lower lobe which measure up to 4 mm, image 103/3. Too small to characterize by PET-CT. ABDOMEN/PELVIS: No abnormal hypermetabolic activity within the liver, pancreas, adrenal glands, or spleen. No hypermetabolic lymph nodes in the abdomen or pelvis. Incidental CT findings: None SKELETON: Multifocal FDG avid bone metastases are identified. Index lesion within the body of sternum without corresponding CT abnormality has an SUV max of  5.4. Index lesion within the T12 vertebra has an SUV max of 5.12. Also without CT correlate. Hypermetabolic lesion within the right iliac bone has an SUV max of 8.43. Large FDG avid right acetabular lesion without corresponding CT abnormality has an SUV max of 13.03. Index lesion within the inferior right pubic rami has an SUV max of 8.36. Incidental CT findings: none IMPRESSION: 1. Multifocal FDG avid right breast lesions with chest wall involvement are identified compatible with primary breast carcinoma. 2. Hypermetabolic right axillary, right supraclavicular, superior mediastinal and bilateral internal mammary nodal metastasis. 3. Multifocal FDG avid bone metastasis is identified. 4. Small nodules in the left lower lobe, too small to reliably characterize by PET-CT. Electronically Signed   By: Kerby Moors M.D.   On: 11/04/2018 15:00   Dg C-arm 1-60 Min-no Report  Result Date: 11/05/2018 Fluoroscopy was utilized by the requesting physician.  No radiographic interpretation.   Ms Clip Placement Right  Result Date: 10/25/2018 CLINICAL DATA:  Post ultrasound-guided core needle biopsy of right breast 5:30 o'clock mass, and an abnormal right axillary lymph node. EXAM: DIAGNOSTIC RIGHT MAMMOGRAM POST ULTRASOUND BIOPSY COMPARISON:  Previous exam(s). FINDINGS: Mammographic images were obtained following ultrasound guided biopsy of right breast and right axilla. Two-view mammography demonstrates presence of heart shaped marker within the right breast mass of question, and HydroMARK within 1 of the abnormal right axillary lymph nodes. IMPRESSION: Successful placement of heart shaped marker within the right breast. Successful placement of HydroMARK within an abnormal right axillary lymph node. Final Assessment: Post Procedure Mammograms for Marker Placement Electronically Signed   By: Fidela Salisbury M.D.   On: 10/25/2018 13:14   [place] Rt Breast Bx W Loc Dev 1st Lesion Img Bx Spec US Guide  Addendum Date:  10/28/2018   ADDENDUM REPORT: 10/28/2018 13:32 ADDENDUM: PATHOLOGY revealed: A. BREAST, RIGHT AT 530 O'CLOCK, 2 CM FROM THE NIPPLE; INVASIVE MAMMARY CARCINOMA, NO SPECIAL TYPE. 10 mm in this sample. Grade 3. Ductal carcinoma in situ: Present, high-grade with comedonecrosis. Lymphovascular invasion: Not identified. B. LYMPH NODE, RIGHT AXILLARY; - INVOLVED BY METASTATIC CARCINOMA, MEASURING AT LEAST 10 MM IN GREATEST EXTENT. - NO RESIDUAL LYMPHOID TISSUE IDENTIFIED IN  THIS SAMPLE. Pathology results are CONCORDANT with imaging findings, per Dr. Fidela Salisbury. Pathology results were discussed with patient via telephone. The patient reported doing well after the biopsy with tenderness at the site. Post biopsy care instructions were reviewed and questions were answered. The patient was encouraged to call [facility] for any additional concerns. Recommendation: Surgical referral. Request for surgical referral was relayed to nurse navigators at [facility] by Electa Sniff RN on 10/28/2018. Addendum by Electa Sniff RN on 10/28/2018. Electronically Signed   By: Franki Cabot M.D.   On: 10/28/2018 13:32   Result Date: 10/28/2018 CLINICAL DATA:  Right breast 5:30 o'clock highly suspicious mass ulcerating through the skin and highly abnormal right axillary lymph node. EXAM: ULTRASOUND GUIDED RIGHT BREAST CORE NEEDLE BIOPSY COMPARISON:  Previous exam(s). FINDINGS: I met with the patient and we discussed the procedure of ultrasound-guided biopsy, including benefits and alternatives. We discussed the high likelihood of a successful procedure. We discussed the risks of the procedure, including infection, bleeding, tissue injury, clip migration, and inadequate sampling. Informed written consent was given. The usual time-out protocol was performed immediately prior to the procedure. Lesion quadrant: Lower inner quadrant and lower outer quadrant Using sterile technique and 1% Lidocaine as local  anesthetic, under direct ultrasound visualization, a 14 gauge spring-loaded device was used to perform biopsy of right breast 5:30 o'clock mass using a lateral approach. At the conclusion of the procedure a heart shaped tissue marker clip was deployed into the biopsy cavity. Next, using sterile technique and 1% Lidocaine as local anesthetic, under direct ultrasound visualization, a 14 gauge spring-loaded device was used to perform biopsy of abnormal right axillary lymph node using a inferior approach. At the conclusion of the procedure a HydroMARK tissue marker clip was deployed into the biopsy cavity. Follow up 2 view mammogram was performed and dictated separately. IMPRESSION: Ultrasound guided biopsy of right breast 5:30 o'clock mass and an abnormal right axillary lymph node. No apparent complications. Electronically Signed: By: Fidela Salisbury M.D. On: 10/25/2018 13:05   [place] Rt Breast Bx W Loc Dev Ea Add Lesion Img Bx Spec US Guide  Addendum Date: 10/28/2018   ADDENDUM REPORT: 10/28/2018 13:32 ADDENDUM: PATHOLOGY revealed: A. BREAST, RIGHT AT 530 O'CLOCK, 2 CM FROM THE NIPPLE; INVASIVE MAMMARY CARCINOMA, NO SPECIAL TYPE. 10 mm in this sample. Grade 3. Ductal carcinoma in situ: Present, high-grade with comedonecrosis. Lymphovascular invasion: Not identified. B. LYMPH NODE, RIGHT AXILLARY; - INVOLVED BY METASTATIC CARCINOMA, MEASURING AT LEAST 10 MM IN GREATEST EXTENT. - NO RESIDUAL LYMPHOID TISSUE IDENTIFIED IN THIS SAMPLE. Pathology results are CONCORDANT with imaging findings, per Dr. Fidela Salisbury. Pathology results were discussed with patient via telephone. The patient reported doing well after the biopsy with tenderness at the site. Post biopsy care instructions were reviewed and questions were answered. The patient was encouraged to call [facility] for any additional concerns. Recommendation: Surgical referral. Request for surgical referral was relayed to nurse navigators at [facility] by Electa Sniff RN on 10/28/2018. Addendum by Electa Sniff RN on 10/28/2018. Electronically Signed   By: Franki Cabot M.D.   On: 10/28/2018 13:32   Result Date: 10/28/2018 CLINICAL DATA:  Right breast 5:30 o'clock highly suspicious mass ulcerating through the skin and highly abnormal right axillary lymph node. EXAM: ULTRASOUND GUIDED RIGHT BREAST CORE NEEDLE BIOPSY COMPARISON:  Previous exam(s). FINDINGS: I met with the patient and we discussed the procedure of ultrasound-guided biopsy, including benefits and alternatives.  We discussed the high likelihood of a successful procedure. We discussed the risks of the procedure, including infection, bleeding, tissue injury, clip migration, and inadequate sampling. Informed written consent was given. The usual time-out protocol was performed immediately prior to the procedure. Lesion quadrant: Lower inner quadrant and lower outer quadrant Using sterile technique and 1% Lidocaine as local anesthetic, under direct ultrasound visualization, a 14 gauge spring-loaded device was used to perform biopsy of right breast 5:30 o'clock mass using a lateral approach. At the conclusion of the procedure a heart shaped tissue marker clip was deployed into the biopsy cavity. Next, using sterile technique and 1% Lidocaine as local anesthetic, under direct ultrasound visualization, a 14 gauge spring-loaded device was used to perform biopsy of abnormal right axillary lymph node using a inferior approach. At the conclusion of the procedure a HydroMARK tissue marker clip was deployed into the biopsy cavity. Follow up 2 view mammogram was performed and dictated separately. IMPRESSION: Ultrasound guided biopsy of right breast 5:30 o'clock mass and an abnormal right axillary lymph node. No apparent complications. Electronically Signed: By: Fidela Salisbury M.D. On: 10/25/2018 13:05     Assessment and plan- Patient is a 33 y.o. female who presents to [facility] for  initial meeting in preparation for starting chemotherapy for the treatment of ***.    1. Cancer-   2. Chemo Care Clinic/High Risk for ER/Hospitalization during chemotherapy- We discussed the role of the chemo care clinic and identified patient specific risk factors. I discussed that patient was identified as high risk primarily based on: ***  Current PCP-  Hospital Admissions-  ED Visits-  Medicaid:  Medicare:  In relationship:  Has anemia: Has asthma:  Has atrial fibrillation Has cvd: Has ckd:  Has copd: Has chf: Has connective tissue disorder: Has depression:  Has diabetes:  Has liver disease:  Has PVD:   3. Social Determinants of Health- we discussed that social determinants of health may have significant impacts on health and outcomes for cancer patients.  Today we discussed specific social determinants of performance status, alcohol use, depression, financial needs, food insecurity, housing, interpersonal violence, social connections, stress, tobacco use, and transportation.  After lengthy discussion the following were identified as areas of need:   Based on performance status/activity level we discussed options including home based and outpatient services, DME, and CARE program. We discusssed that patients who participate in regular physical activity report fewer negative impacts of cancer and treatments and report less fatigue.   Based on depression: we discussed self-referral to [facility] for counseling services, psychiatry for medication management, or palliative care/symptom management as well as primary care providers.   Based on financial insecurity: We discussed that living with cancer can create tremendous financial burden.  We discussed options for assistance. I asked that if assistance is needed in affording medications or paying bills to please let us know so that we can provide assistance.   Based on food insecurity-we discussed options for food including social  services.  We will also notify [facility] to see if cancer center can provide support.  Patient informed of food pantry at cancer center and was provided with care package today.  Please notify nursing if un-met needs.  Interpersonal Violence-   Based on alcohol use-   Based on housing insecurity: we discussed referral to social work. Will also discuss with Elease Etienne to see if [place] can provide support of utility bills, etc.   Based on lack of social connections-we  discussed options for support groups at the cancer center. If interested, please notify nurse navigator to enroll.   Based on concern of stress-we discussed options for managing stress including healthy eating, exercise as well as participating in no charge counseling services at the cancer center and support groups.  If these are of interest, patient can notify either myself or primary nursing team.  Based on tobacco use-smoking cessation was encouraged.  We discussed options for management including medications and referral to quit Smart program  Based on transportation need: we discussed options for transportation including [employer], paratransit, bus routes, [employer] transit, taxi/uber/lyft, and cancer center van.  I have notified primary oncology team who will help assist with arranging Lucianne Lei transportation for appointments when needed. We also discussed options for transportation on short notice/acute visits.   4. Co-morbidities Complicating Care:   5. Palliative Care- based on stage of cancer and/or identified needs today, I will refer patient to palliative care for goals of care and advanced care planning.  We also discussed the role of the Symptom Management Clinic at [facility] for acute issues and methods of contacting clinic/provider. She denies needing specific assistance at this time and She will be followed by ***, RN (Nurse Navigator).    Visit Diagnosis No diagnosis found.   Patient expressed understanding and was in  agreement with this plan. She also understands that She can call clinic at any time with any questions, concerns, or complaints.   A total of (25) minutes of face-to-face time was spent with this patient with greater than 50% of that time in counseling and care-coordination.  Beckey Rutter, DNP, AGNP-C [place] [facility] (work cell) [phone] (office)  CC:

## 2018-11-07 ENCOUNTER — Other Ambulatory Visit: Payer: Self-pay | Admitting: *Deleted

## 2018-11-07 ENCOUNTER — Inpatient Hospital Stay: Payer: Medicaid Other | Admitting: Oncology

## 2018-11-07 ENCOUNTER — Inpatient Hospital Stay (HOSPITAL_BASED_OUTPATIENT_CLINIC_OR_DEPARTMENT_OTHER): Payer: Medicaid Other | Admitting: Oncology

## 2018-11-07 ENCOUNTER — Other Ambulatory Visit: Payer: Self-pay

## 2018-11-07 ENCOUNTER — Encounter: Payer: Self-pay | Admitting: Oncology

## 2018-11-07 ENCOUNTER — Encounter: Payer: Self-pay | Admitting: *Deleted

## 2018-11-07 VITALS — BP 144/85 | HR 81 | Temp 98.8°F | Resp 16 | Ht 64.5 in | Wt 111.0 lb

## 2018-11-07 DIAGNOSIS — C799 Secondary malignant neoplasm of unspecified site: Secondary | ICD-10-CM

## 2018-11-07 DIAGNOSIS — C50511 Malignant neoplasm of lower-outer quadrant of right female breast: Secondary | ICD-10-CM

## 2018-11-07 DIAGNOSIS — Z171 Estrogen receptor negative status [ER-]: Secondary | ICD-10-CM

## 2018-11-07 DIAGNOSIS — Z7189 Other specified counseling: Secondary | ICD-10-CM | POA: Diagnosis not present

## 2018-11-07 DIAGNOSIS — C7951 Secondary malignant neoplasm of bone: Secondary | ICD-10-CM | POA: Diagnosis not present

## 2018-11-07 DIAGNOSIS — C50919 Malignant neoplasm of unspecified site of unspecified female breast: Secondary | ICD-10-CM | POA: Insufficient documentation

## 2018-11-07 MED ORDER — DEXAMETHASONE 4 MG PO TABS: ORAL_TABLET | ORAL | 1 refills | Status: DC

## 2018-11-07 MED ORDER — ONDANSETRON HCL 8 MG PO TABS: 8.0000 mg | ORAL_TABLET | Freq: Two times a day (BID) | ORAL | 1 refills | Status: DC | PRN

## 2018-11-07 MED ORDER — LORAZEPAM 0.5 MG PO TABS: 0.5000 mg | ORAL_TABLET | Freq: Four times a day (QID) | ORAL | 0 refills | Status: DC | PRN

## 2018-11-07 MED ORDER — ONDANSETRON HCL 8 MG PO TABS: 8.0000 mg | ORAL_TABLET | Freq: Two times a day (BID) | ORAL | 1 refills | Status: AC | PRN

## 2018-11-07 MED ORDER — PROCHLORPERAZINE MALEATE 10 MG PO TABS: 10.0000 mg | ORAL_TABLET | Freq: Four times a day (QID) | ORAL | 1 refills | Status: DC | PRN

## 2018-11-07 MED ORDER — OXYCODONE HCL 10 MG PO TABS: 10.0000 mg | ORAL_TABLET | ORAL | 0 refills | Status: DC | PRN

## 2018-11-07 MED ORDER — PROCHLORPERAZINE MALEATE 10 MG PO TABS: 10.0000 mg | ORAL_TABLET | Freq: Four times a day (QID) | ORAL | 1 refills | Status: AC | PRN

## 2018-11-07 NOTE — Progress Notes (Signed)
amb  

## 2018-11-07 NOTE — Progress Notes (Signed)
START OFF PATHWAY REGIMEN - Breast   [id number]:Doxorubicin + Cyclophosphamide (AC):   A cycle is every 21 days:     Doxorubicin      Cyclophosphamide   **Always confirm dose/schedule in your pharmacy ordering system**  Patient Characteristics: Distant Metastases or Locoregional Recurrent Disease - Unresected or Locally Advanced Unresectable Disease Progressing after Neoadjuvant and Local Therapies, HER2 Negative/Unknown/Equivocal, ER Negative/Unknown, Chemotherapy, First Line, ER Negative,  PD-L1 Expression Negative/Unknown Therapeutic Status: Distant Metastases BRCA Mutation Status: Awaiting Test Results ER Status: Negative (-) HER2 Status: Negative (-) PR Status: Negative (-) Line of Therapy: First Line PD-L1 Expression Status: Awaiting Test Results Intent of Therapy: Non-Curative / Palliative Intent, Discussed with Patient

## 2018-11-07 NOTE — Progress Notes (Signed)
Met patient today in the clinic.  Informed her that her Medicaid had been approved.  Discussed additional assistance with cleaning and prepared meals.  Form sent to the [facility] for assistance.  Patient has talked with Barnabas Lister the social worker today for assistance with applying for disability.  No other needs at this time.

## 2018-11-07 NOTE — Progress Notes (Signed)
START OFF PATHWAY REGIMEN - Breast   [id number]:Doxorubicin + Cyclophosphamide (AC):   A cycle is every 21 days:     Doxorubicin      Cyclophosphamide   **Always confirm dose/schedule in your pharmacy ordering system**  Patient Characteristics: Distant Metastases or Locoregional Recurrent Disease - Unresected or Locally Advanced Unresectable Disease Progressing after Neoadjuvant and Local Therapies, HER2 Negative/Unknown/Equivocal, ER Negative/Unknown, Chemotherapy, First Line, ER Negative,  PD-L1 Expression Negative/Unknown Therapeutic Status: Distant Metastases BRCA Mutation Status: Awaiting Test Results ER Status: Negative (-) HER2 Status: Negative (-) PR Status: Negative (-) Line of Therapy: First Line PD-L1 Expression Status: Awaiting Test Results Intent of Therapy: Non-Curative / Palliative Intent, Discussed with Patient 

## 2018-11-07 NOTE — Progress Notes (Signed)
Patient has breast and chest pain - rates a 10- states pain meds do not give good relief

## 2018-11-08 ENCOUNTER — Ambulatory Visit: Payer: Self-pay | Admitting: Oncology

## 2018-11-08 ENCOUNTER — Other Ambulatory Visit: Payer: Self-pay

## 2018-11-08 ENCOUNTER — Ambulatory Visit: Payer: Self-pay

## 2018-11-08 ENCOUNTER — Telehealth: Payer: Self-pay

## 2018-11-08 ENCOUNTER — Inpatient Hospital Stay (HOSPITAL_BASED_OUTPATIENT_CLINIC_OR_DEPARTMENT_OTHER): Payer: Medicaid Other | Admitting: Oncology

## 2018-11-08 ENCOUNTER — Ambulatory Visit
Admission: RE | Admit: 2018-11-08 | Discharge: 2018-11-08 | Disposition: A | Discharge status: Home / Self Care | Payer: Medicaid Other | Source: Ambulatory Visit | Attending: Oncology | Admitting: Oncology

## 2018-11-08 DIAGNOSIS — C50912 Malignant neoplasm of unspecified site of left female breast: Secondary | ICD-10-CM | POA: Insufficient documentation

## 2018-11-08 DIAGNOSIS — I340 Nonrheumatic mitral (valve) insufficiency: Secondary | ICD-10-CM | POA: Insufficient documentation

## 2018-11-08 DIAGNOSIS — C50611 Malignant neoplasm of axillary tail of right female breast: Secondary | ICD-10-CM | POA: Insufficient documentation

## 2018-11-08 DIAGNOSIS — C799 Secondary malignant neoplasm of unspecified site: Secondary | ICD-10-CM

## 2018-11-08 DIAGNOSIS — Z7189 Other specified counseling: Secondary | ICD-10-CM | POA: Insufficient documentation

## 2018-11-08 NOTE — Progress Notes (Signed)
*  PRELIMINARY RESULTS* Echocardiogram 2D Echocardiogram has been performed.  Sherrie Sport 11/08/2018, 10:33 AM

## 2018-11-08 NOTE — Progress Notes (Signed)
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  After repeated pursuading efforts  She went for a mammogram and ultrasound on 09/18/2018.  This showed a highly suspicious mass in the right breast 4.5 cm at the 5:30 position as well as a 9 mm satellite mass along with 5 abnormal lymph nodes.  However she did not pursue this with a biopsy until a month later and had a biopsy on 10/25/2018 which showed invasive mammary carcinoma grade 3 and at the time of my visit today her ER PR and HER-2 status is pending.  She also noticed that the mass had grown in size and invading through her right chest wall.  She is a single mom of 4 kids.  She is currently unemployed her parents live close by and help her out.  PET CT scan showed multifocal FDG avid right breast lesions with chest wall involvement along with hypermetabolic right axillary supraclavicular superior mediastinal and bilateral internal mammary nodal metastases.Hypermetabolic bone metastases was identified in the body of the sternum, T12 vertebral as well as lesions in the right iliac bone and a large FDG avid right acetabular lesion without a correlating CT abnormality   Interval history-patient complains of pain and soreness over the site of her right breast wound.  She denies any hip pain  or trouble with walking.  Patient has been taking as needed Percocet but that has not been helping with her pain.  ECOG PS- 1 Pain scale- 3 Opioid associated constipation- no  Review of systems- Review of Systems  Constitutional: Negative for chills, fever, malaise/fatigue and weight loss.  HENT: Negative for congestion, ear discharge and nosebleeds.   Eyes: Negative for blurred vision.  Respiratory: Negative for cough, hemoptysis, sputum production, shortness of breath and wheezing.   Cardiovascular: Negative for chest pain, palpitations, orthopnea and claudication.  Gastrointestinal: Negative for abdominal pain, blood in stool, constipation, diarrhea, heartburn, melena, nausea and vomiting.  Genitourinary: Negative for dysuria, flank pain, frequency, hematuria and urgency.  Musculoskeletal: Negative for back pain, joint pain and myalgias.  Skin: Negative for rash.  Neurological: Negative for dizziness, tingling, focal weakness, seizures, weakness and headaches.  Endo/Heme/Allergies: Does not bruise/bleed easily.  Psychiatric/Behavioral: Negative for depression and suicidal ideas. The patient does not have insomnia.        No Known Allergies   Past Medical History:  Diagnosis Date   Anemia      Past Surgical History:  Procedure Laterality Date   BREAST BIOPSY Right 10/25/2018   Heart Clip, Pending path   BREAST BIOPSY Right 10/25/2018   Lymph node biopsy, Hydromarker (butterfly), path pending   CESAREAN SECTION     CESAREAN SECTION N/A 04/24/2016   Procedure: REPEAT CESAREAN SECTION WITH BILATERAL TUBALIGATION;  Surgeon: Brayton Mars, MD;  Location: [facility] ORS;  Service: Obstetrics;  Laterality: N/A;  Baby Boy born @ 79 Apgars:8/9  Weight: 8lb 9oz   PORTACATH PLACEMENT Left 11/05/2018   Procedure: INSERTION PORT-A-CATH;  Surgeon: Jules Husbands, MD;  Location: [facility] ORS;  Service: General;  Laterality: Left;    Social History   Socioeconomic History    Marital status: Single    Spouse name: Not on file   Number of children: Not on file   Years of education: Not on file   Highest education level: Not on file  Occupational History   Not on file  Social Needs   Financial resource strain: Not on file   Food insecurity    Worry: Not on file    Inability: Not on file   Transportation needs    Medical: Not on file    Non-medical: Not on file  Tobacco Use   Smoking status: Current Every Day Smoker    Packs/day: 0.25    Years: 5.00    Pack years: 1.25    Types: Cigarettes   Smokeless tobacco: Never Used  Substance and Sexual Activity   Alcohol use: No   Drug use: No   Sexual activity: Yes  Lifestyle   Physical activity    Days per week: Not on file    Minutes per session: Not on file   Stress: Not on file  Relationships   Social connections    Talks on phone: Not on file    Gets together: Not on file    Attends religious service: Not on file    Active member of club or organization: Not on file    Attends meetings of clubs or organizations: Not on file    Relationship status: Not on file   Intimate partner violence    Fear of current or ex partner: Not on file    Emotionally abused: Not on file    Physically abused: Not on file    Forced sexual activity: Not on file  Other Topics Concern   Not on file  Social History Narrative   Not on file    Family History  Problem Relation Age of Onset   Hypertension Mother    Stroke Father    Cancer Maternal Grandmother    Diabetes Maternal Grandfather    Heart disease Maternal Grandfather    Hypertension Maternal Grandfather      Current Outpatient Medications:    gabapentin (NEURONTIN) 300 MG capsule, Take 1 capsule (300 mg total) by mouth 3 (three) times daily., Disp: 90 capsule, Rfl: 0   HYDROcodone-acetaminophen (NORCO/VICODIN) 5-325 MG tablet, Take 1 tablet by mouth every 6 (six) hours as needed for moderate pain., Disp: 15 tablet, Rfl: 0    lidocaine-prilocaine (EMLA) cream, Apply 1 application topically as directed. Apply small amount over port site 1 hour before treatments, cover the cream with saran wrap to protect your clothing, Disp: 30 g, Rfl: 0   dexamethasone (DECADRON) 4 MG tablet, Take 2 tablets by mouth once a day on the day after chemotherapy and then take 2 tablets two times a day for 2 days. Take with food., Disp: 30 tablet, Rfl: 1   LORazepam (ATIVAN) 0.5 MG tablet, Take 1 tablet (0.5 mg total) by mouth every 6 (six) hours as needed (Nausea or vomiting)., Disp: 30 tablet, Rfl: 0   ondansetron (ZOFRAN) 8 MG tablet, Take 1 tablet (8 mg total) by mouth 2 (two) times daily as needed. Start on the third day after chemotherapy., Disp: 30 tablet, Rfl: 1   Oxycodone HCl 10 MG TABS, Take 1 tablet (10 mg total)  by mouth every 4 (four) hours as needed., Disp: 120 tablet, Rfl: 0   prochlorperazine (COMPAZINE) 10 MG tablet, Take 1 tablet (10 mg total) by mouth every 6 (six) hours as needed (Nausea or vomiting)., Disp: 30 tablet, Rfl: 1  Physical exam:  Vitals:   11/07/18 0942  BP: (!) 144/85  Pulse: 81  Resp: 16  Temp: 98.8 F (37.1 C)  TempSrc: Tympanic  Weight: 111 lb (50.3 kg)  Height: 5' 4.5" (1.638 m)   Physical Exam HENT:     Head: Normocephalic and atraumatic.  Eyes:     Pupils: Pupils are equal, round, and reactive to light.  Neck:     Musculoskeletal: Normal range of motion.  Cardiovascular:     Rate and Rhythm: Normal rate and regular rhythm.     Heart sounds: Normal heart sounds.  Pulmonary:     Effort: Pulmonary effort is normal.     Breath sounds: Normal breath sounds.  Abdominal:     General: Bowel sounds are normal.     Palpations: Abdomen is soft.  Skin:    General: Skin is warm and dry.  Neurological:     Mental Status: She is alert and oriented to person, place, and time.     Breast exam was deferred today.   CMP Latest Ref Rng & Units 10/31/2018  Glucose 70 - 99 mg/dL 109(H)  BUN 6  - 20 mg/dL 14  Creatinine 0.44 - 1.00 mg/dL 0.73  Sodium 135 - 145 mmol/L 139  Potassium 3.5 - 5.1 mmol/L 3.9  Chloride 98 - 111 mmol/L 106  CO2 22 - 32 mmol/L 27  Calcium 8.9 - 10.3 mg/dL 9.5  Total Protein 6.5 - 8.1 g/dL 8.5(H)  Total Bilirubin 0.3 - 1.2 mg/dL 0.4  Alkaline Phos 38 - 126 U/L 57  AST 15 - 41 U/L 18  ALT 0 - 44 U/L 10   CBC Latest Ref Rng & Units 10/31/2018  WBC 4.0 - 10.5 K/uL 6.9  Hemoglobin 12.0 - 15.0 g/dL 8.9(L)  Hematocrit 36.0 - 46.0 % 29.9(L)  Platelets 150 - 400 K/uL 389       Dg Chest 1 View  Result Date: 11/05/2018 CLINICAL DATA:  Breast cancer.  Port insertion. EXAM: CHEST  1 VIEW COMPARISON:  None. FINDINGS: Heart size is normal. Power port inserted on the left via a subclavian approach with the tip at the SVC RA junction. No pneumothorax. Lungs remain clear. No bone abnormality. IMPRESSION: Power port tip at the SVC RA junction. No pneumothorax. No active disease. Electronically Signed   By: Nelson Chimes M.D.   On: 11/05/2018 10:25   Nm Pet Image Initial (pi) Skull Base To Thigh  Result Date: 11/04/2018 CLINICAL DATA:  Initial treatment strategy for breast cancer. EXAM: NUCLEAR MEDICINE PET SKULL BASE TO THIGH TECHNIQUE: 5.9 mCi F-18 FDG was injected intravenously. Full-ring PET imaging was performed from the skull base to thigh after the radiotracer. CT data was obtained and used for attenuation correction and anatomic localization. Fasting blood glucose: 94 mg/dl COMPARISON:  None. FINDINGS: Mediastinal blood pool activity: SUV max 1.9. Liver activity: SUV max NA NECK: No hypermetabolic lymph nodes in the neck. Incidental CT findings: none CHEST: Mass within the inferior right breast measures 3 cm and has an SUV max of 18.57. There is associated chest wall invasion with tumor involvement of the medial aspect of the pectoralis muscle extending to the pleural surface of the lung. Separate chest wall mass involving the medial chest  wall adjacent to the sternum  on the right measures approximately 4 cm with SUV max of 8.1. Index right axillary lymph node measures 1.4 cm within SUV max of 16.18. Index right supraclavicular node measures 1.2 cm with SUV max of 9.0. Bilateral FDG avid internal mammary lymph nodes are identified. Index left internal mammary lymph node has an SUV max of 3.7. Low right internal mammary lymph node identified adjacent to the xiphoid process has an SUV max of 4.15. Within the left prevascular region of the superior mediastinum there is an FDG avid lymph node anterior to the arch of the aorta which has an SUV max of 4.35. Incidental CT findings: There are 2 subcentimeter nodules noted within the left lower lobe which measure up to 4 mm, image 103/3. Too small to characterize by PET-CT. ABDOMEN/PELVIS: No abnormal hypermetabolic activity within the liver, pancreas, adrenal glands, or spleen. No hypermetabolic lymph nodes in the abdomen or pelvis. Incidental CT findings: None SKELETON: Multifocal FDG avid bone metastases are identified. Index lesion within the body of sternum without corresponding CT abnormality has an SUV max of 5.4. Index lesion within the T12 vertebra has an SUV max of 5.12. Also without CT correlate. Hypermetabolic lesion within the right iliac bone has an SUV max of 8.43. Large FDG avid right acetabular lesion without corresponding CT abnormality has an SUV max of 13.03. Index lesion within the inferior right pubic rami has an SUV max of 8.36. Incidental CT findings: none IMPRESSION: 1. Multifocal FDG avid right breast lesions with chest wall involvement are identified compatible with primary breast carcinoma. 2. Hypermetabolic right axillary, right supraclavicular, superior mediastinal and bilateral internal mammary nodal metastasis. 3. Multifocal FDG avid bone metastasis is identified. 4. Small nodules in the left lower lobe, too small to reliably characterize by PET-CT. Electronically Signed   By: Kerby Moors M.D.   On:  11/04/2018 15:00   Dg C-arm 1-60 Min-no Report  Result Date: 11/05/2018 Fluoroscopy was utilized by the requesting physician.  No radiographic interpretation.   Ms Clip Placement Right  Result Date: 10/25/2018 CLINICAL DATA:  Post ultrasound-guided core needle biopsy of right breast 5:30 o'clock mass, and an abnormal right axillary lymph node. EXAM: DIAGNOSTIC RIGHT MAMMOGRAM POST ULTRASOUND BIOPSY COMPARISON:  Previous exam(s). FINDINGS: Mammographic images were obtained following ultrasound guided biopsy of right breast and right axilla. Two-view mammography demonstrates presence of heart shaped marker within the right breast mass of question, and HydroMARK within 1 of the abnormal right axillary lymph nodes. IMPRESSION: Successful placement of heart shaped marker within the right breast. Successful placement of HydroMARK within an abnormal right axillary lymph node. Final Assessment: Post Procedure Mammograms for Marker Placement Electronically Signed   By: Fidela Salisbury M.D.   On: 10/25/2018 13:14   [place] Rt Breast Bx W Loc Dev 1st Lesion Img Bx Spec US Guide  Addendum Date: 10/28/2018   ADDENDUM REPORT: 10/28/2018 13:32 ADDENDUM: PATHOLOGY revealed: A. BREAST, RIGHT AT 530 O'CLOCK, 2 CM FROM THE NIPPLE; INVASIVE MAMMARY CARCINOMA, NO SPECIAL TYPE. 10 mm in this sample. Grade 3. Ductal carcinoma in situ: Present, high-grade with comedonecrosis. Lymphovascular invasion: Not identified. B. LYMPH NODE, RIGHT AXILLARY; - INVOLVED BY METASTATIC CARCINOMA, MEASURING AT LEAST 10 MM IN GREATEST EXTENT. - NO RESIDUAL LYMPHOID TISSUE IDENTIFIED IN THIS SAMPLE. Pathology results are CONCORDANT with imaging findings, per Dr. Fidela Salisbury. Pathology results were discussed with patient via telephone. The patient reported doing well after the biopsy with tenderness at the site. Post biopsy  care instructions were reviewed and questions were answered. The patient was encouraged to call [facility]  for any additional concerns. Recommendation: Surgical referral. Request for surgical referral was relayed to nurse navigators at [facility] by Electa Sniff RN on 10/28/2018. Addendum by Electa Sniff RN on 10/28/2018. Electronically Signed   By: Franki Cabot M.D.   On: 10/28/2018 13:32   Result Date: 10/28/2018 CLINICAL DATA:  Right breast 5:30 o'clock highly suspicious mass ulcerating through the skin and highly abnormal right axillary lymph node. EXAM: ULTRASOUND GUIDED RIGHT BREAST CORE NEEDLE BIOPSY COMPARISON:  Previous exam(s). FINDINGS: I met with the patient and we discussed the procedure of ultrasound-guided biopsy, including benefits and alternatives. We discussed the high likelihood of a successful procedure. We discussed the risks of the procedure, including infection, bleeding, tissue injury, clip migration, and inadequate sampling. Informed written consent was given. The usual time-out protocol was performed immediately prior to the procedure. Lesion quadrant: Lower inner quadrant and lower outer quadrant Using sterile technique and 1% Lidocaine as local anesthetic, under direct ultrasound visualization, a 14 gauge spring-loaded device was used to perform biopsy of right breast 5:30 o'clock mass using a lateral approach. At the conclusion of the procedure a heart shaped tissue marker clip was deployed into the biopsy cavity. Next, using sterile technique and 1% Lidocaine as local anesthetic, under direct ultrasound visualization, a 14 gauge spring-loaded device was used to perform biopsy of abnormal right axillary lymph node using a inferior approach. At the conclusion of the procedure a HydroMARK tissue marker clip was deployed into the biopsy cavity. Follow up 2 view mammogram was performed and dictated separately. IMPRESSION: Ultrasound guided biopsy of right breast 5:30 o'clock mass and an abnormal right axillary lymph node. No apparent complications. Electronically Signed: By:  Fidela Salisbury M.D. On: 10/25/2018 13:05   [place] Rt Breast Bx W Loc Dev Ea Add Lesion Img Bx Spec US Guide  Addendum Date: 10/28/2018   ADDENDUM REPORT: 10/28/2018 13:32 ADDENDUM: PATHOLOGY revealed: A. BREAST, RIGHT AT 530 O'CLOCK, 2 CM FROM THE NIPPLE; INVASIVE MAMMARY CARCINOMA, NO SPECIAL TYPE. 10 mm in this sample. Grade 3. Ductal carcinoma in situ: Present, high-grade with comedonecrosis. Lymphovascular invasion: Not identified. B. LYMPH NODE, RIGHT AXILLARY; - INVOLVED BY METASTATIC CARCINOMA, MEASURING AT LEAST 10 MM IN GREATEST EXTENT. - NO RESIDUAL LYMPHOID TISSUE IDENTIFIED IN THIS SAMPLE. Pathology results are CONCORDANT with imaging findings, per Dr. Fidela Salisbury. Pathology results were discussed with patient via telephone. The patient reported doing well after the biopsy with tenderness at the site. Post biopsy care instructions were reviewed and questions were answered. The patient was encouraged to call [facility] for any additional concerns. Recommendation: Surgical referral. Request for surgical referral was relayed to nurse navigators at [facility] by Electa Sniff RN on 10/28/2018. Addendum by Electa Sniff RN on 10/28/2018. Electronically Signed   By: Franki Cabot M.D.   On: 10/28/2018 13:32   Result Date: 10/28/2018 CLINICAL DATA:  Right breast 5:30 o'clock highly suspicious mass ulcerating through the skin and highly abnormal right axillary lymph node. EXAM: ULTRASOUND GUIDED RIGHT BREAST CORE NEEDLE BIOPSY COMPARISON:  Previous exam(s). FINDINGS: I met with the patient and we discussed the procedure of ultrasound-guided biopsy, including benefits and alternatives. We discussed the high likelihood of a successful procedure. We discussed the risks of the procedure, including infection, bleeding, tissue injury, clip migration, and inadequate sampling. Informed written consent was given. The usual time-out protocol was  performed immediately prior to the  procedure. Lesion quadrant: Lower inner quadrant and lower outer quadrant Using sterile technique and 1% Lidocaine as local anesthetic, under direct ultrasound visualization, a 14 gauge spring-loaded device was used to perform biopsy of right breast 5:30 o'clock mass using a lateral approach. At the conclusion of the procedure a heart shaped tissue marker clip was deployed into the biopsy cavity. Next, using sterile technique and 1% Lidocaine as local anesthetic, under direct ultrasound visualization, a 14 gauge spring-loaded device was used to perform biopsy of abnormal right axillary lymph node using a inferior approach. At the conclusion of the procedure a HydroMARK tissue marker clip was deployed into the biopsy cavity. Follow up 2 view mammogram was performed and dictated separately. IMPRESSION: Ultrasound guided biopsy of right breast 5:30 o'clock mass and an abnormal right axillary lymph node. No apparent complications. Electronically Signed: By: Fidela Salisbury M.D. On: 10/25/2018 13:05     Assessment and plan- Patient is a 33 y.o. female with newly diagnosed stage IV triple negative right breast cancer stage IV c3 CN3CM1 with bone metastases  I have reviewed PET/CT scan images independently and discussed findings with the patient.  Unfortunately PET CT scan shows evidence of stage IV disease and presence of bone metastases.  She would therefore not be a candidate for curative intent treatment at this time.  Patient is fairly advanced local disease which has involved the skin as well as chest wall.  I would therefore like to get a quick response and better local control first.  I will therefore recommend giving her doxorubicin and Cytoxan every 3 weeks for 4 cycles to get a rapid response.  I will get a repeat CT chest abdomen pelvis and bone scan following 4 cycles.  Following that I will switch her to single agent Abraxane and Tecentriq depending on her PDL 1 status.  Depending on how she  responds to chemotherapy we could consider a toilet mastectomy down the line if wound healing still remains a concern.  PDL 1 and NGS testing will be ordered today.  Her baseline echocardiogram is currently pending.  It will be done before she starts chemotherapy.  I discussed risks and benefits of chemotherapy including all but not limited to nausea, vomiting, fatigue, low blood counts, risk of infection and hospitalization.  Risk of cardiotoxicity associated with anthracycline.  Patient understands and agrees to proceed.  Treatment will be given with a palliative intent.  With regards to her bone metastases: I will obtain plain films of her pelvis and right femur and discussed her case with radiation oncology to offer her palliative radiation.  However I would not like to hold her systemic chemotherapy given the aggressive nature of her underlying malignancy.  Also discussed role for bisphosphonates given presence of bone metastases.  Discussed risks and benefits of Xgeva including all but not limited to possible risk of osteonecrosis of the jaw.  Patient reports that she does have a dentist and we would like to get dental clearance first before proceeding with Xgeva.  I will tentatively see her back on 11/12/2018 with CBC with differential, CMP to receive first cycle of AC chemotherapy with ongoing Neulasta support.  I will also obtain baseline MRI brain to rule out brain metastases.  We also made genetic referral at this time.  Cancer Staging Breast cancer [facility]) Staging form: Breast, AJCC 8th Edition - Clinical stage from 11/05/2018: Stage IV (cT4, cN3, cM1, G3, ER-, PR-, HER2-) - Signed by Randa Evens  C, MD on 11/08/2018     Total face to face encounter time for this patient visit was 40 min. >50% of the time was  spent in counseling and coordination of care.     Visit Diagnosis 1. Malignant neoplasm of lower-outer quadrant of right breast of female, estrogen receptor negative ([place])   2.  Goals of care, counseling/discussion      Dr. Randa Evens, MD, MPH [facility] [phone] 11/08/2018 9:03 AM

## 2018-11-08 NOTE — Progress Notes (Deleted)
[place]  [phone] [phone]  Patient Care Team: Bunnie Pion, FNP as PCP - General (Family Medicine) Rico Junker, RN as Registered Nurse Theodore Demark, RN as Registered Nurse   Name of the patient: [name]  [id number]  [birth year]   Date of visit: 11/08/18  Diagnosis-metastatic breast cancer  Chief complaint/Reason for visit- Initial Meeting for [facility], preparing for starting chemotherapy  Heme/Onc history:  Oncology History  Breast cancer ([place])  11/05/2018 Cancer Staging   Staging form: Breast, AJCC 8th Edition - Clinical stage from 11/05/2018: Stage IV (cT4, cN3, cM1, G3, ER-, PR-, HER2-) - Signed by Sindy Guadeloupe, MD on 11/08/2018   11/07/2018 Initial Diagnosis   Breast cancer ([place])   11/12/2018 -  Chemotherapy   The patient had DOXOrubicin (ADRIAMYCIN) chemo injection 90 mg, 60 mg/m2, Intravenous,  Once, 0 of 4 cycles PALONOSETRON HCL INJECTION 0.25 MG/5ML, 0.25 mg, Intravenous,  Once, 0 of 4 cycles pegfilgrastim-jmdb (FULPHILA) injection 6 mg, 6 mg, Subcutaneous,  Once, 0 of 4 cycles cyclophosphamide (CYTOXAN) 900 mg in sodium chloride 0.9 % 250 mL chemo infusion, 600 mg/m2, Intravenous,  Once, 0 of 4 cycles FOSAPREPITANT '150MG'$  + DEXAMETHASONE INFUSION [facility], , Intravenous,  Once, 0 of 4 cycles  for chemotherapy treatment.      Interval history-[name] is a 33 year old female who presents to chemo care clinic today for initial meeting in preparation for starting chemotherapy. I introduced the chemo care clinic and we discussed that the role of the clinic is to assist those who are at an increased risk of emergency room visits and/or complications during the course of chemotherapy treatment. We discussed that the increased risk takes into account factors such as age, performance status, and co-morbidities. We also discussed that for some, this might include barriers  to care such as not having a primary care provider, lack of insurance/transportation, or not being able to afford medications. We discussed that the goal of the program is to help prevent unplanned ER visits and help reduce complications during chemotherapy. We do this by discussing specific risk factors to each individual and identifying ways that we can help improve these risk factors and reduce barriers to care.   ECOG FS:0 - Asymptomatic  Review of systems- Review of Systems  Constitutional: Positive for malaise/fatigue.  Cardiovascular: Positive for chest pain (breast pain).  Psychiatric/Behavioral: The patient is nervous/anxious.      Current treatment- Schedule to begin doxorubicin and Cytoxan every 3 weeks for 4 cycles to get a rapid response. Repeat CT chest abdomen pelvis and bone scan following 4 cycles.  No Known Allergies  Past Medical History:  Diagnosis Date   Anemia     Past Surgical History:  Procedure Laterality Date   BREAST BIOPSY Right 10/25/2018   Heart Clip, Pending path   BREAST BIOPSY Right 10/25/2018   Lymph node biopsy, Hydromarker (butterfly), path pending   CESAREAN SECTION     CESAREAN SECTION N/A 04/24/2016   Procedure: REPEAT CESAREAN SECTION WITH BILATERAL TUBALIGATION;  Surgeon: Brayton Mars, MD;  Location: [facility] ORS;  Service: Obstetrics;  Laterality: N/A;  Baby Boy born @ 24 Apgars:8/9 Weight: 8lb 9oz   PORTACATH PLACEMENT Left 11/05/2018   Procedure: INSERTION PORT-A-CATH;  Surgeon: Jules Husbands, MD;  Location: [facility] ORS;  Service: General;  Laterality: Left;    Social History   Socioeconomic History   Marital status: Single    Spouse  name: Not on file   Number of children: Not on file   Years of education: Not on file   Highest education level: Not on file  Occupational History   Not on file  Social Needs   Financial resource strain: Not on file   Food insecurity    Worry: Not on file    Inability: Not on file     Transportation needs    Medical: Not on file    Non-medical: Not on file  Tobacco Use   Smoking status: Current Every Day Smoker    Packs/day: 0.25    Years: 5.00    Pack years: 1.25    Types: Cigarettes   Smokeless tobacco: Never Used  Substance and Sexual Activity   Alcohol use: No   Drug use: No   Sexual activity: Yes  Lifestyle   Physical activity    Days per week: Not on file    Minutes per session: Not on file   Stress: Not on file  Relationships   Social connections    Talks on phone: Not on file    Gets together: Not on file    Attends religious service: Not on file    Active member of club or organization: Not on file    Attends meetings of clubs or organizations: Not on file    Relationship status: Not on file   Intimate partner violence    Fear of current or ex partner: Not on file    Emotionally abused: Not on file    Physically abused: Not on file    Forced sexual activity: Not on file  Other Topics Concern   Not on file  Social History Narrative   Not on file    Family History  Problem Relation Age of Onset   Hypertension Mother    Stroke Father    Cancer Maternal Grandmother    Diabetes Maternal Grandfather    Heart disease Maternal Grandfather    Hypertension Maternal Grandfather      Current Outpatient Medications:    dexamethasone (DECADRON) 4 MG tablet, Take 2 tablets by mouth once a day on the day after chemotherapy and then take 2 tablets two times a day for 2 days. Take with food., Disp: 30 tablet, Rfl: 1   gabapentin (NEURONTIN) 300 MG capsule, Take 1 capsule (300 mg total) by mouth 3 (three) times daily., Disp: 90 capsule, Rfl: 0   HYDROcodone-acetaminophen (NORCO/VICODIN) 5-325 MG tablet, Take 1 tablet by mouth every 6 (six) hours as needed for moderate pain., Disp: 15 tablet, Rfl: 0   lidocaine-prilocaine (EMLA) cream, Apply 1 application topically as directed. Apply small amount over port site 1 hour before  treatments, cover the cream with saran wrap to protect your clothing, Disp: 30 g, Rfl: 0   LORazepam (ATIVAN) 0.5 MG tablet, Take 1 tablet (0.5 mg total) by mouth every 6 (six) hours as needed (Nausea or vomiting)., Disp: 30 tablet, Rfl: 0   ondansetron (ZOFRAN) 8 MG tablet, Take 1 tablet (8 mg total) by mouth 2 (two) times daily as needed. Start on the third day after chemotherapy., Disp: 30 tablet, Rfl: 1   Oxycodone HCl 10 MG TABS, Take 1 tablet (10 mg total) by mouth every 4 (four) hours as needed., Disp: 120 tablet, Rfl: 0   prochlorperazine (COMPAZINE) 10 MG tablet, Take 1 tablet (10 mg total) by mouth every 6 (six) hours as needed (Nausea or vomiting)., Disp: 30 tablet, Rfl: 1  Physical exam: There were  no vitals filed for this visit. Physical Exam Constitutional:      Appearance: Normal appearance.  HENT:     Head: Normocephalic and atraumatic.  Eyes:     Pupils: Pupils are equal, round, and reactive to light.  Neck:     Musculoskeletal: Normal range of motion.  Cardiovascular:     Rate and Rhythm: Normal rate and regular rhythm.     Heart sounds: Normal heart sounds. No murmur.  Pulmonary:     Effort: Pulmonary effort is normal.     Breath sounds: Normal breath sounds. No wheezing.  Abdominal:     General: Bowel sounds are normal. There is no distension.     Palpations: Abdomen is soft.     Tenderness: There is no abdominal tenderness.  Musculoskeletal: Normal range of motion.  Skin:    General: Skin is warm and dry.     Findings: No rash.  Neurological:     Mental Status: She is alert and oriented to person, place, and time.  Psychiatric:        Judgment: Judgment normal.      CMP Latest Ref Rng & Units 10/31/2018  Glucose 70 - 99 mg/dL 109(H)  BUN 6 - 20 mg/dL 14  Creatinine 0.44 - 1.00 mg/dL 0.73  Sodium 135 - 145 mmol/L 139  Potassium 3.5 - 5.1 mmol/L 3.9  Chloride 98 - 111 mmol/L 106  CO2 22 - 32 mmol/L 27  Calcium 8.9 - 10.3 mg/dL 9.5  Total Protein 6.5  - 8.1 g/dL 8.5(H)  Total Bilirubin 0.3 - 1.2 mg/dL 0.4  Alkaline Phos 38 - 126 U/L 57  AST 15 - 41 U/L 18  ALT 0 - 44 U/L 10   CBC Latest Ref Rng & Units 10/31/2018  WBC 4.0 - 10.5 K/uL 6.9  Hemoglobin 12.0 - 15.0 g/dL 8.9(L)  Hematocrit 36.0 - 46.0 % 29.9(L)  Platelets 150 - 400 K/uL 389    No images are attached to the encounter.  Dg Chest 1 View  Result Date: 11/05/2018 CLINICAL DATA:  Breast cancer.  Port insertion. EXAM: CHEST  1 VIEW COMPARISON:  None. FINDINGS: Heart size is normal. Power port inserted on the left via a subclavian approach with the tip at the SVC RA junction. No pneumothorax. Lungs remain clear. No bone abnormality. IMPRESSION: Power port tip at the SVC RA junction. No pneumothorax. No active disease. Electronically Signed   By: Nelson Chimes M.D.   On: 11/05/2018 10:25   Nm Pet Image Initial (pi) Skull Base To Thigh  Result Date: 11/04/2018 CLINICAL DATA:  Initial treatment strategy for breast cancer. EXAM: NUCLEAR MEDICINE PET SKULL BASE TO THIGH TECHNIQUE: 5.9 mCi F-18 FDG was injected intravenously. Full-ring PET imaging was performed from the skull base to thigh after the radiotracer. CT data was obtained and used for attenuation correction and anatomic localization. Fasting blood glucose: 94 mg/dl COMPARISON:  None. FINDINGS: Mediastinal blood pool activity: SUV max 1.9. Liver activity: SUV max NA NECK: No hypermetabolic lymph nodes in the neck. Incidental CT findings: none CHEST: Mass within the inferior right breast measures 3 cm and has an SUV max of 18.57. There is associated chest wall invasion with tumor involvement of the medial aspect of the pectoralis muscle extending to the pleural surface of the lung. Separate chest wall mass involving the medial chest wall adjacent to the sternum on the right measures approximately 4 cm with SUV max of 8.1. Index right axillary lymph node measures 1.4 cm within  SUV max of 16.18. Index right supraclavicular node measures 1.2  cm with SUV max of 9.0. Bilateral FDG avid internal mammary lymph nodes are identified. Index left internal mammary lymph node has an SUV max of 3.7. Low right internal mammary lymph node identified adjacent to the xiphoid process has an SUV max of 4.15. Within the left prevascular region of the superior mediastinum there is an FDG avid lymph node anterior to the arch of the aorta which has an SUV max of 4.35. Incidental CT findings: There are 2 subcentimeter nodules noted within the left lower lobe which measure up to 4 mm, image 103/3. Too small to characterize by PET-CT. ABDOMEN/PELVIS: No abnormal hypermetabolic activity within the liver, pancreas, adrenal glands, or spleen. No hypermetabolic lymph nodes in the abdomen or pelvis. Incidental CT findings: None SKELETON: Multifocal FDG avid bone metastases are identified. Index lesion within the body of sternum without corresponding CT abnormality has an SUV max of 5.4. Index lesion within the T12 vertebra has an SUV max of 5.12. Also without CT correlate. Hypermetabolic lesion within the right iliac bone has an SUV max of 8.43. Large FDG avid right acetabular lesion without corresponding CT abnormality has an SUV max of 13.03. Index lesion within the inferior right pubic rami has an SUV max of 8.36. Incidental CT findings: none IMPRESSION: 1. Multifocal FDG avid right breast lesions with chest wall involvement are identified compatible with primary breast carcinoma. 2. Hypermetabolic right axillary, right supraclavicular, superior mediastinal and bilateral internal mammary nodal metastasis. 3. Multifocal FDG avid bone metastasis is identified. 4. Small nodules in the left lower lobe, too small to reliably characterize by PET-CT. Electronically Signed   By: Kerby Moors M.D.   On: 11/04/2018 15:00   Dg C-arm 1-60 Min-no Report  Result Date: 11/05/2018 Fluoroscopy was utilized by the requesting physician.  No radiographic interpretation.   Ms Clip Placement  Right  Result Date: 10/25/2018 CLINICAL DATA:  Post ultrasound-guided core needle biopsy of right breast 5:30 o'clock mass, and an abnormal right axillary lymph node. EXAM: DIAGNOSTIC RIGHT MAMMOGRAM POST ULTRASOUND BIOPSY COMPARISON:  Previous exam(s). FINDINGS: Mammographic images were obtained following ultrasound guided biopsy of right breast and right axilla. Two-view mammography demonstrates presence of heart shaped marker within the right breast mass of question, and HydroMARK within 1 of the abnormal right axillary lymph nodes. IMPRESSION: Successful placement of heart shaped marker within the right breast. Successful placement of HydroMARK within an abnormal right axillary lymph node. Final Assessment: Post Procedure Mammograms for Marker Placement Electronically Signed   By: Fidela Salisbury M.D.   On: 10/25/2018 13:14   [place] Rt Breast Bx W Loc Dev 1st Lesion Img Bx Spec US Guide  Addendum Date: 10/28/2018   ADDENDUM REPORT: 10/28/2018 13:32 ADDENDUM: PATHOLOGY revealed: A. BREAST, RIGHT AT 530 O'CLOCK, 2 CM FROM THE NIPPLE; INVASIVE MAMMARY CARCINOMA, NO SPECIAL TYPE. 10 mm in this sample. Grade 3. Ductal carcinoma in situ: Present, high-grade with comedonecrosis. Lymphovascular invasion: Not identified. B. LYMPH NODE, RIGHT AXILLARY; - INVOLVED BY METASTATIC CARCINOMA, MEASURING AT LEAST 10 MM IN GREATEST EXTENT. - NO RESIDUAL LYMPHOID TISSUE IDENTIFIED IN THIS SAMPLE. Pathology results are CONCORDANT with imaging findings, per Dr. Fidela Salisbury. Pathology results were discussed with patient via telephone. The patient reported doing well after the biopsy with tenderness at the site. Post biopsy care instructions were reviewed and questions were answered. The patient was encouraged to call [facility] for any additional concerns. Recommendation: Surgical referral. Request for  surgical referral was relayed to nurse navigators at [facility] by Electa Sniff RN on  10/28/2018. Addendum by Electa Sniff RN on 10/28/2018. Electronically Signed   By: Franki Cabot M.D.   On: 10/28/2018 13:32   Result Date: 10/28/2018 CLINICAL DATA:  Right breast 5:30 o'clock highly suspicious mass ulcerating through the skin and highly abnormal right axillary lymph node. EXAM: ULTRASOUND GUIDED RIGHT BREAST CORE NEEDLE BIOPSY COMPARISON:  Previous exam(s). FINDINGS: I met with the patient and we discussed the procedure of ultrasound-guided biopsy, including benefits and alternatives. We discussed the high likelihood of a successful procedure. We discussed the risks of the procedure, including infection, bleeding, tissue injury, clip migration, and inadequate sampling. Informed written consent was given. The usual time-out protocol was performed immediately prior to the procedure. Lesion quadrant: Lower inner quadrant and lower outer quadrant Using sterile technique and 1% Lidocaine as local anesthetic, under direct ultrasound visualization, a 14 gauge spring-loaded device was used to perform biopsy of right breast 5:30 o'clock mass using a lateral approach. At the conclusion of the procedure a heart shaped tissue marker clip was deployed into the biopsy cavity. Next, using sterile technique and 1% Lidocaine as local anesthetic, under direct ultrasound visualization, a 14 gauge spring-loaded device was used to perform biopsy of abnormal right axillary lymph node using a inferior approach. At the conclusion of the procedure a HydroMARK tissue marker clip was deployed into the biopsy cavity. Follow up 2 view mammogram was performed and dictated separately. IMPRESSION: Ultrasound guided biopsy of right breast 5:30 o'clock mass and an abnormal right axillary lymph node. No apparent complications. Electronically Signed: By: Fidela Salisbury M.D. On: 10/25/2018 13:05   [place] Rt Breast Bx W Loc Dev Ea Add Lesion Img Bx Spec US Guide  Addendum Date: 10/28/2018   ADDENDUM REPORT: 10/28/2018 13:32  ADDENDUM: PATHOLOGY revealed: A. BREAST, RIGHT AT 530 O'CLOCK, 2 CM FROM THE NIPPLE; INVASIVE MAMMARY CARCINOMA, NO SPECIAL TYPE. 10 mm in this sample. Grade 3. Ductal carcinoma in situ: Present, high-grade with comedonecrosis. Lymphovascular invasion: Not identified. B. LYMPH NODE, RIGHT AXILLARY; - INVOLVED BY METASTATIC CARCINOMA, MEASURING AT LEAST 10 MM IN GREATEST EXTENT. - NO RESIDUAL LYMPHOID TISSUE IDENTIFIED IN THIS SAMPLE. Pathology results are CONCORDANT with imaging findings, per Dr. Fidela Salisbury. Pathology results were discussed with patient via telephone. The patient reported doing well after the biopsy with tenderness at the site. Post biopsy care instructions were reviewed and questions were answered. The patient was encouraged to call [facility] for any additional concerns. Recommendation: Surgical referral. Request for surgical referral was relayed to nurse navigators at [facility] by Electa Sniff RN on 10/28/2018. Addendum by Electa Sniff RN on 10/28/2018. Electronically Signed   By: Franki Cabot M.D.   On: 10/28/2018 13:32   Result Date: 10/28/2018 CLINICAL DATA:  Right breast 5:30 o'clock highly suspicious mass ulcerating through the skin and highly abnormal right axillary lymph node. EXAM: ULTRASOUND GUIDED RIGHT BREAST CORE NEEDLE BIOPSY COMPARISON:  Previous exam(s). FINDINGS: I met with the patient and we discussed the procedure of ultrasound-guided biopsy, including benefits and alternatives. We discussed the high likelihood of a successful procedure. We discussed the risks of the procedure, including infection, bleeding, tissue injury, clip migration, and inadequate sampling. Informed written consent was given. The usual time-out protocol was performed immediately prior to the procedure. Lesion quadrant: Lower inner quadrant and lower outer quadrant Using sterile technique and 1% Lidocaine as local anesthetic, under direct  ultrasound visualization, a 14  gauge spring-loaded device was used to perform biopsy of right breast 5:30 o'clock mass using a lateral approach. At the conclusion of the procedure a heart shaped tissue marker clip was deployed into the biopsy cavity. Next, using sterile technique and 1% Lidocaine as local anesthetic, under direct ultrasound visualization, a 14 gauge spring-loaded device was used to perform biopsy of abnormal right axillary lymph node using a inferior approach. At the conclusion of the procedure a HydroMARK tissue marker clip was deployed into the biopsy cavity. Follow up 2 view mammogram was performed and dictated separately. IMPRESSION: Ultrasound guided biopsy of right breast 5:30 o'clock mass and an abnormal right axillary lymph node. No apparent complications. Electronically Signed: By: Fidela Salisbury M.D. On: 10/25/2018 13:05     Assessment and plan- Patient is a 33 y.o. female who presents to [facility] for initial meeting in preparation for starting chemotherapy for the treatment of stage IV breast cancer.   Metastatic breast cancer: patient is a 33 year old African-American female who first noticed right breast mass about 6 months ago but did not seek medical attention at that time. After repeated pursuadingefforts She went for a mammogram and ultrasound on 09/18/2018. This showed a highly suspicious mass in the right breast 4.5 cm at the 5:30 position as well as a 9 mm satellite mass along with 5 abnormal lymph nodes. However she did not pursue this with a biopsy until a month later and had a biopsy on 10/25/2018 which showed invasive mammary carcinoma grade 3 and at the time of my visit today her ER PR and HER-2 status is pending. She also noticed that the mass had grown in size and invading through her right chest wall. She is a single mom of 4 kids. She is currently unemployed her parents live close by and help her out.  PET CT scan showed multifocal FDG avid right breast lesions with chest  wall involvement along with hypermetabolic right axillary supraclavicular superior mediastinal and bilateral internal mammary nodal metastases.Hypermetabolic bone metastases was identified in the body of the sternum, T12 vertebral as well as lesions in the right iliac bone and a large FDG avid right acetabular lesion without a correlating CT abnormality  Chemo Care Clinic/High Risk for ER/Hospitalization during chemotherapy- We discussed the role of the chemo care clinic and identified patient specific risk factors. I discussed that patient was identified as high risk primarily based on stage of disease.  Patient currently has a PCP and is Hendricks Milo, FNP at [facility].  She was referred to [facility] for a right breast mass in July 2020.  She is single and has 4 children.  She is currently unemployed.  She does not have transportation.  Past medical history is positive for anemia.   Social Determinants of Health- we discussed that social determinants of health may have significant impacts on health and outcomes for cancer patients.  Today we discussed specific social determinants of performance status, alcohol use, depression, financial needs, food insecurity, housing, interpersonal violence, social connections, stress, tobacco use, and transportation.  After lengthy discussion the following were identified as areas of need include financial insecurity, food insecurity, housing insecurity, stress, tobacco use and transportation.  We discussed that living with cancer can create tremendous financial burden.  We discussed options for assistance. I asked that if assistance is needed in affording medications or paying bills to please let us know so that we can provide assistance.   We discussed options for  food including social services.  We will also notify [facility] to see if cancer center can provide support.  Patient informed of food pantry at cancer center and was provided with care package today.  Please  notify nursing if un-met needs.  We discussed referral to social work. Will also discuss with Elease Etienne to see if [place] can provide support of utility bills, etc.   We discussed options for support groups at the cancer center. If interested, please notify nurse navigator to enroll.   We discussed options for managing stress including healthy eating, exercise as well as participating in no charge counseling services at the cancer center and support groups.  If these are of interest, patient can notify either myself or primary nursing team.  We discussed options for management including medications and referral to quit Smart program  We discussed options for transportation including [employer]a, paratransit, bus routes, link transit, taxi/uber/lyft, and cancer center van.  I have notified primary oncology team who will help assist with arranging Lucianne Lei transportation for appointments when needed. We also discussed options for transportation on short notice/acute visits.   4. Co-morbidities Complicating Care:  Past Medical History:  Diagnosis Date   Anemia    5. Palliative Care- based on stage of cancer and/or identified needs today, I will refer patient to palliative care for goals of care and advanced care planning.  We also discussed the role of the Symptom Management Clinic at [facility] for acute issues and methods of contacting clinic/provider. She denies needing specific assistance at this time and She will be followed by ***, RN (Nurse Navigator).   Visit Diagnosis No diagnosis found.   Patient expressed understanding and was in agreement with this plan. She also understands that She can call clinic at any time with any questions, concerns, or complaints.   A total of (25) minutes of face-to-face time was spent with this patient with greater than 50% of that time in counseling and care-coordination.  Beckey Rutter, DNP, AGNP-C [place] [facility] [place] (work  cell) [phone] (office)  CC:

## 2018-11-08 NOTE — Telephone Encounter (Signed)
Attempted call to complete pre-visit assessment for appointment on 11/12/2018 with Dr. Janese Banks. Unable to leave VM.

## 2018-11-11 ENCOUNTER — Other Ambulatory Visit: Payer: Self-pay | Admitting: *Deleted

## 2018-11-11 DIAGNOSIS — C50919 Malignant neoplasm of unspecified site of unspecified female breast: Secondary | ICD-10-CM

## 2018-11-12 ENCOUNTER — Inpatient Hospital Stay: Payer: Medicaid Other

## 2018-11-12 ENCOUNTER — Ambulatory Visit: Payer: Self-pay

## 2018-11-12 ENCOUNTER — Other Ambulatory Visit: Payer: Self-pay

## 2018-11-12 ENCOUNTER — Inpatient Hospital Stay (HOSPITAL_BASED_OUTPATIENT_CLINIC_OR_DEPARTMENT_OTHER): Payer: Medicaid Other | Admitting: Oncology

## 2018-11-12 ENCOUNTER — Encounter: Payer: Self-pay | Admitting: Oncology

## 2018-11-12 VITALS — BP 138/91 | HR 91 | Temp 97.6°F | Resp 16 | Wt 111.0 lb

## 2018-11-12 DIAGNOSIS — Z171 Estrogen receptor negative status [ER-]: Secondary | ICD-10-CM

## 2018-11-12 DIAGNOSIS — C50511 Malignant neoplasm of lower-outer quadrant of right female breast: Secondary | ICD-10-CM

## 2018-11-12 DIAGNOSIS — D509 Iron deficiency anemia, unspecified: Secondary | ICD-10-CM | POA: Diagnosis not present

## 2018-11-12 DIAGNOSIS — Z5111 Encounter for antineoplastic chemotherapy: Secondary | ICD-10-CM | POA: Diagnosis not present

## 2018-11-12 DIAGNOSIS — C7951 Secondary malignant neoplasm of bone: Secondary | ICD-10-CM | POA: Diagnosis not present

## 2018-11-12 LAB — CBC WITH DIFFERENTIAL/PLATELET
Abs Immature Granulocytes: 0.03 10*3/uL (ref 0.00–0.07)
Basophils Absolute: 0 10*3/uL (ref 0.0–0.1)
Basophils Relative: 0 %
Eosinophils Absolute: 0.2 10*3/uL (ref 0.0–0.5)
Eosinophils Relative: 2 %
HCT: 29.2 % — ABNORMAL LOW (ref 36.0–46.0)
Hemoglobin: 9 g/dL — ABNORMAL LOW (ref 12.0–15.0)
Immature Granulocytes: 0 %
Lymphocytes Relative: 21 %
Lymphs Abs: 1.9 10*3/uL (ref 0.7–4.0)
MCH: 20.2 pg — ABNORMAL LOW (ref 26.0–34.0)
MCHC: 30.8 g/dL (ref 30.0–36.0)
MCV: 65.6 fL — ABNORMAL LOW (ref 80.0–100.0)
Monocytes Absolute: 0.6 10*3/uL (ref 0.1–1.0)
Monocytes Relative: 7 %
Neutro Abs: 6.4 10*3/uL (ref 1.7–7.7)
Neutrophils Relative %: 70 %
Platelets: 452 10*3/uL — ABNORMAL HIGH (ref 150–400)
RBC: 4.45 MIL/uL (ref 3.87–5.11)
RDW: 23.1 % — ABNORMAL HIGH (ref 11.5–15.5)
WBC: 9.2 10*3/uL (ref 4.0–10.5)
nRBC: 0 % (ref 0.0–0.2)

## 2018-11-12 LAB — COMPREHENSIVE METABOLIC PANEL
ALT: 11 U/L (ref 0–44)
AST: 18 U/L (ref 15–41)
Albumin: 4.2 g/dL (ref 3.5–5.0)
Alkaline Phosphatase: 61 U/L (ref 38–126)
Anion gap: 9 (ref 5–15)
BUN: 13 mg/dL (ref 6–20)
CO2: 25 mmol/L (ref 22–32)
Calcium: 9.2 mg/dL (ref 8.9–10.3)
Chloride: 103 mmol/L (ref 98–111)
Creatinine, Ser: 0.74 mg/dL (ref 0.44–1.00)
GFR calc Af Amer: >60 mL/min (ref >60)
GFR calc non Af Amer: >60 mL/min (ref >60)
Glucose, Bld: 136 mg/dL — ABNORMAL HIGH (ref 70–99)
Potassium: 3.5 mmol/L (ref 3.5–5.1)
Sodium: 137 mmol/L (ref 135–145)
Total Bilirubin: 0.5 mg/dL (ref 0.3–1.2)
Total Protein: 7.8 g/dL (ref 6.5–8.1)

## 2018-11-12 MED ORDER — DOXORUBICIN HCL CHEMO IV INJECTION 2 MG/ML
60.0000 mg/m2 | Freq: Once | INTRAVENOUS | Status: AC
  Administered 2018-11-12: 10:00:00 90 mg via INTRAVENOUS
  Filled 2018-11-12: qty 25

## 2018-11-12 MED ORDER — SODIUM CHLORIDE 0.9 % IV SOLN
Freq: Once | INTRAVENOUS | Status: AC
  Administered 2018-11-12: 10:00:00 via INTRAVENOUS
  Filled 2018-11-12: qty 5

## 2018-11-12 MED ORDER — SODIUM CHLORIDE 0.9 % IV SOLN
Freq: Once | INTRAVENOUS | Status: AC
  Administered 2018-11-12: 09:00:00 via INTRAVENOUS
  Filled 2018-11-12: qty 250

## 2018-11-12 MED ORDER — SODIUM CHLORIDE 0.9 % IV SOLN
510.0000 mg | Freq: Once | INTRAVENOUS | Status: AC
  Administered 2018-11-12: 12:00:00 510 mg via INTRAVENOUS
  Filled 2018-11-12: qty 17

## 2018-11-12 MED ORDER — SODIUM CHLORIDE 0.9% FLUSH
10.0000 mL | INTRAVENOUS | Status: DC | PRN
  Administered 2018-11-12: 10 mL via INTRAVENOUS
  Filled 2018-11-12: qty 10

## 2018-11-12 MED ORDER — PALONOSETRON HCL INJECTION 0.25 MG/5ML
0.2500 mg | Freq: Once | INTRAVENOUS | Status: AC
  Administered 2018-11-12: 10:00:00 0.25 mg via INTRAVENOUS
  Filled 2018-11-12: qty 5

## 2018-11-12 MED ORDER — SODIUM CHLORIDE 0.9 % IV SOLN
600.0000 mg/m2 | Freq: Once | INTRAVENOUS | Status: AC
  Administered 2018-11-12: 11:00:00 900 mg via INTRAVENOUS
  Filled 2018-11-12: qty 45

## 2018-11-12 MED ORDER — HEPARIN SOD (PORK) LOCK FLUSH 100 UNIT/ML IV SOLN
500.0000 [IU] | Freq: Once | INTRAVENOUS | Status: AC
  Administered 2018-11-12: 12:00:00 500 [IU] via INTRAVENOUS
  Filled 2018-11-12: qty 5

## 2018-11-12 NOTE — Progress Notes (Signed)
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 After repeated pursuadingefforts She went for a mammogram and ultrasound on 09/18/2018. This showed a highly suspicious mass in the right breast 4.5 cm at the 5:30 position as well as a 9 mm satellite mass along with 5 abnormal lymph nodes. However she did not pursue this with a biopsy until a month later and had a biopsy on 10/25/2018 which showed invasive mammary carcinoma grade 3 and at the time of my visit today her ER PR and HER-2 status is pending. She also noticed that the mass had grown in size and invading through her right chest wall. She is a single mom of 4 kids. She is currently unemployed her parents live close by and help her out.  PET CT scan showed multifocal FDG avid right breast lesions with chest wall involvement along with hypermetabolic right axillary supraclavicular superior mediastinal and bilateral internal mammary nodal metastases.Hypermetabolic bone metastases was identified in the body of the sternum, T12 vertebral as well as lesions in the right iliac bone and a large FDG avid right acetabular lesion without a correlating CT abnormality   Interval history-reports that right breast pain is much better controlled with oxycodone which she has been  taking 3 times a day.  Reports constipation secondary to pain medications.  Denies other complaints at this time  ECOG PS- 1 Pain scale- 0 Opioid associated constipation- yes  Review of systems- Review of Systems  Constitutional: Positive for malaise/fatigue. Negative for chills, fever and weight loss.  HENT: Negative for congestion, ear discharge and nosebleeds.   Eyes: Negative for blurred vision.  Respiratory: Negative for cough, hemoptysis, sputum production, shortness of breath and wheezing.   Cardiovascular: Negative for chest pain, palpitations, orthopnea and claudication.  Gastrointestinal: Positive for constipation. Negative for abdominal pain, blood in stool, diarrhea, heartburn, melena, nausea and vomiting.  Genitourinary: Negative for dysuria, flank pain, frequency, hematuria and urgency.  Musculoskeletal: Negative for back pain, joint pain and myalgias.  Skin: Negative for rash.  Neurological: Negative for dizziness, tingling, focal weakness, seizures, weakness and headaches.  Endo/Heme/Allergies: Does not bruise/bleed easily.  Psychiatric/Behavioral: Negative for depression and suicidal ideas. The patient does not have insomnia.       No Known Allergies   Past Medical History:  Diagnosis Date   Anemia    Breast cancer [facility])      Past Surgical History:  Procedure Laterality Date   BREAST BIOPSY Right 10/25/2018   Heart Clip, Pending path   BREAST BIOPSY Right 10/25/2018   Lymph node biopsy, Hydromarker (butterfly), path pending   CESAREAN SECTION     CESAREAN SECTION N/A 04/24/2016   Procedure: REPEAT CESAREAN SECTION WITH BILATERAL TUBALIGATION;  Surgeon: Brayton Mars, MD;  Location: [facility] ORS;  Service: Obstetrics;  Laterality: N/A;  Baby Boy born @ 33 Apgars:8/9 Weight: 8lb 9oz  PORTACATH PLACEMENT Left 11/05/2018   Procedure: INSERTION PORT-A-CATH;  Surgeon: Jules Husbands, MD;  Location: [facility] ORS;  Service: General;  Laterality: Left;     Social History   Socioeconomic History   Marital status: Single    Spouse name: Not on file   Number of children: Not on file   Years of education: Not on file   Highest education level: Not on file  Occupational History   Not on file  Social Needs   Financial resource strain: Not on file   Food insecurity    Worry: Not on file    Inability: Not on file   Transportation needs    Medical: Not on file    Non-medical: Not on file  Tobacco Use   Smoking status: Current Every Day Smoker    Packs/day: 0.25    Years: 5.00    Pack years: 1.25    Types: Cigarettes   Smokeless tobacco: Never Used  Substance and Sexual Activity   Alcohol use: No   Drug use: No   Sexual activity: Yes  Lifestyle   Physical activity    Days per week: Not on file    Minutes per session: Not on file   Stress: Not on file  Relationships   Social connections    Talks on phone: Not on file    Gets together: Not on file    Attends religious service: Not on file    Active member of club or organization: Not on file    Attends meetings of clubs or organizations: Not on file    Relationship status: Not on file   Intimate partner violence    Fear of current or ex partner: Not on file    Emotionally abused: Not on file    Physically abused: Not on file    Forced sexual activity: Not on file  Other Topics Concern   Not on file  Social History Narrative   Not on file    Family History  Problem Relation Age of Onset   Hypertension Mother    Stroke Father    Cancer Maternal Grandmother    Diabetes Maternal Grandfather    Heart disease Maternal Grandfather    Hypertension Maternal Grandfather      Current Outpatient Medications:    dexamethasone (DECADRON) 4 MG tablet, Take 2 tablets by mouth once a day on the day after chemotherapy and then take 2 tablets two times a day for 2 days. Take with food., Disp: 30 tablet, Rfl: 1   gabapentin (NEURONTIN) 300 MG capsule,  Take 1 capsule (300 mg total) by mouth 3 (three) times daily., Disp: 90 capsule, Rfl: 0   lidocaine-prilocaine (EMLA) cream, Apply 1 application topically as directed. Apply small amount over port site 1 hour before treatments, cover the cream with saran wrap to protect your clothing, Disp: 30 g, Rfl: 0   LORazepam (ATIVAN) 0.5 MG tablet, Take 1 tablet (0.5 mg total) by mouth every 6 (six) hours as needed (Nausea or vomiting)., Disp: 30 tablet, Rfl: 0   ondansetron (ZOFRAN) 8 MG tablet, Take 1 tablet (8 mg total) by mouth 2 (two) times daily as needed. Start on the third day after chemotherapy., Disp: 30 tablet, Rfl: 1   Oxycodone HCl 10 MG TABS, Take 1 tablet (10 mg total) by mouth every 4 (four) hours as needed., Disp: 120 tablet, Rfl: 0   prochlorperazine (COMPAZINE) 10 MG tablet, Take 1 tablet (10 mg total) by mouth every 6 (six)  hours as needed (Nausea or vomiting)., Disp: 30 tablet, Rfl: 1 No current facility-administered medications for this visit.   Facility-Administered Medications Ordered in Other Visits:    heparin lock flush 100 unit/mL, 500 Units, Intravenous, Once, Sindy Guadeloupe, MD   sodium chloride flush (NS) 0.9 % injection 10 mL, 10 mL, Intravenous, PRN, Sindy Guadeloupe, MD, 10 mL at 11/12/18 0819  Physical exam:  Vitals:   11/12/18 0900  BP: (!) 138/91  Pulse: 91  Resp: 16  Temp: 97.6 F (36.4 C)  TempSrc: Tympanic  Weight: 111 lb (50.3 kg)   Physical Exam Constitutional:      General: She is not in acute distress. HENT:     Head: Normocephalic and atraumatic.  Eyes:     Pupils: Pupils are equal, round, and reactive to light.  Neck:     Musculoskeletal: Normal range of motion.  Cardiovascular:     Rate and Rhythm: Normal rate and regular rhythm.     Heart sounds: Normal heart sounds.  Pulmonary:     Effort: Pulmonary effort is normal.     Breath sounds: Normal breath sounds.  Abdominal:     General: Bowel sounds are normal.     Palpations: Abdomen is  soft.  Skin:    General: Skin is warm and dry.  Neurological:     Mental Status: She is alert and oriented to person, place, and time.      CMP Latest Ref Rng & Units 10/31/2018  Glucose 70 - 99 mg/dL 109(H)  BUN 6 - 20 mg/dL 14  Creatinine 0.44 - 1.00 mg/dL 0.73  Sodium 135 - 145 mmol/L 139  Potassium 3.5 - 5.1 mmol/L 3.9  Chloride 98 - 111 mmol/L 106  CO2 22 - 32 mmol/L 27  Calcium 8.9 - 10.3 mg/dL 9.5  Total Protein 6.5 - 8.1 g/dL 8.5(H)  Total Bilirubin 0.3 - 1.2 mg/dL 0.4  Alkaline Phos 38 - 126 U/L 57  AST 15 - 41 U/L 18  ALT 0 - 44 U/L 10   CBC Latest Ref Rng & Units 11/12/2018  WBC 4.0 - 10.5 K/uL 9.2  Hemoglobin 12.0 - 15.0 g/dL 9.0(L)  Hematocrit 36.0 - 46.0 % 29.2(L)  Platelets 150 - 400 K/uL 452(H)    No images are attached to the encounter.  Dg Chest 1 View  Result Date: 11/05/2018 CLINICAL DATA:  Breast cancer.  Port insertion. EXAM: CHEST  1 VIEW COMPARISON:  None. FINDINGS: Heart size is normal. Power port inserted on the left via a subclavian approach with the tip at the SVC RA junction. No pneumothorax. Lungs remain clear. No bone abnormality. IMPRESSION: Power port tip at the SVC RA junction. No pneumothorax. No active disease. Electronically Signed   By: Nelson Chimes M.D.   On: 11/05/2018 10:25   Nm Pet Image Initial (pi) Skull Base To Thigh  Result Date: 11/04/2018 CLINICAL DATA:  Initial treatment strategy for breast cancer. EXAM: NUCLEAR MEDICINE PET SKULL BASE TO THIGH TECHNIQUE: 5.9 mCi F-18 FDG was injected intravenously. Full-ring PET imaging was performed from the skull base to thigh after the radiotracer. CT data was obtained and used for attenuation correction and anatomic localization. Fasting blood glucose: 94 mg/dl COMPARISON:  None. FINDINGS: Mediastinal blood pool activity: SUV max 1.9. Liver activity: SUV max NA NECK: No hypermetabolic lymph nodes in the neck. Incidental CT findings: none CHEST: Mass within the inferior right breast measures 3 cm  and has an SUV max of 18.57. There is  associated chest wall invasion with tumor involvement of the medial aspect of the pectoralis muscle extending to the pleural surface of the lung. Separate chest wall mass involving the medial chest wall adjacent to the sternum on the right measures approximately 4 cm with SUV max of 8.1. Index right axillary lymph node measures 1.4 cm within SUV max of 16.18. Index right supraclavicular node measures 1.2 cm with SUV max of 9.0. Bilateral FDG avid internal mammary lymph nodes are identified. Index left internal mammary lymph node has an SUV max of 3.7. Low right internal mammary lymph node identified adjacent to the xiphoid process has an SUV max of 4.15. Within the left prevascular region of the superior mediastinum there is an FDG avid lymph node anterior to the arch of the aorta which has an SUV max of 4.35. Incidental CT findings: There are 2 subcentimeter nodules noted within the left lower lobe which measure up to 4 mm, image 103/3. Too small to characterize by PET-CT. ABDOMEN/PELVIS: No abnormal hypermetabolic activity within the liver, pancreas, adrenal glands, or spleen. No hypermetabolic lymph nodes in the abdomen or pelvis. Incidental CT findings: None SKELETON: Multifocal FDG avid bone metastases are identified. Index lesion within the body of sternum without corresponding CT abnormality has an SUV max of 5.4. Index lesion within the T12 vertebra has an SUV max of 5.12. Also without CT correlate. Hypermetabolic lesion within the right iliac bone has an SUV max of 8.43. Large FDG avid right acetabular lesion without corresponding CT abnormality has an SUV max of 13.03. Index lesion within the inferior right pubic rami has an SUV max of 8.36. Incidental CT findings: none IMPRESSION: 1. Multifocal FDG avid right breast lesions with chest wall involvement are identified compatible with primary breast carcinoma. 2. Hypermetabolic right axillary, right supraclavicular,  superior mediastinal and bilateral internal mammary nodal metastasis. 3. Multifocal FDG avid bone metastasis is identified. 4. Small nodules in the left lower lobe, too small to reliably characterize by PET-CT. Electronically Signed   By: Kerby Moors M.D.   On: 11/04/2018 15:00   Dg C-arm 1-60 Min-no Report  Result Date: 11/05/2018 Fluoroscopy was utilized by the requesting physician.  No radiographic interpretation.   Ms Clip Placement Right  Result Date: 10/25/2018 CLINICAL DATA:  Post ultrasound-guided core needle biopsy of right breast 5:30 o'clock mass, and an abnormal right axillary lymph node. EXAM: DIAGNOSTIC RIGHT MAMMOGRAM POST ULTRASOUND BIOPSY COMPARISON:  Previous exam(s). FINDINGS: Mammographic images were obtained following ultrasound guided biopsy of right breast and right axilla. Two-view mammography demonstrates presence of heart shaped marker within the right breast mass of question, and HydroMARK within 1 of the abnormal right axillary lymph nodes. IMPRESSION: Successful placement of heart shaped marker within the right breast. Successful placement of HydroMARK within an abnormal right axillary lymph node. Final Assessment: Post Procedure Mammograms for Marker Placement Electronically Signed   By: Fidela Salisbury M.D.   On: 10/25/2018 13:14   [place] Rt Breast Bx W Loc Dev 1st Lesion Img Bx Spec US Guide  Addendum Date: 10/28/2018   ADDENDUM REPORT: 10/28/2018 13:32 ADDENDUM: PATHOLOGY revealed: A. BREAST, RIGHT AT 530 O'CLOCK, 2 CM FROM THE NIPPLE; INVASIVE MAMMARY CARCINOMA, NO SPECIAL TYPE. 10 mm in this sample. Grade 3. Ductal carcinoma in situ: Present, high-grade with comedonecrosis. Lymphovascular invasion: Not identified. B. LYMPH NODE, RIGHT AXILLARY; - INVOLVED BY METASTATIC CARCINOMA, MEASURING AT LEAST 10 MM IN GREATEST EXTENT. - NO RESIDUAL LYMPHOID TISSUE IDENTIFIED IN THIS SAMPLE. Pathology results are CONCORDANT  with imaging findings, per Dr. Fidela Salisbury.  Pathology results were discussed with patient via telephone. The patient reported doing well after the biopsy with tenderness at the site. Post biopsy care instructions were reviewed and questions were answered. The patient was encouraged to call [facility] for any additional concerns. Recommendation: Surgical referral. Request for surgical referral was relayed to nurse navigators at [facility] by Electa Sniff RN on 10/28/2018. Addendum by Electa Sniff RN on 10/28/2018. Electronically Signed   By: Franki Cabot M.D.   On: 10/28/2018 13:32   Result Date: 10/28/2018 CLINICAL DATA:  Right breast 5:30 o'clock highly suspicious mass ulcerating through the skin and highly abnormal right axillary lymph node. EXAM: ULTRASOUND GUIDED RIGHT BREAST CORE NEEDLE BIOPSY COMPARISON:  Previous exam(s). FINDINGS: I met with the patient and we discussed the procedure of ultrasound-guided biopsy, including benefits and alternatives. We discussed the high likelihood of a successful procedure. We discussed the risks of the procedure, including infection, bleeding, tissue injury, clip migration, and inadequate sampling. Informed written consent was given. The usual time-out protocol was performed immediately prior to the procedure. Lesion quadrant: Lower inner quadrant and lower outer quadrant Using sterile technique and 1% Lidocaine as local anesthetic, under direct ultrasound visualization, a 14 gauge spring-loaded device was used to perform biopsy of right breast 5:30 o'clock mass using a lateral approach. At the conclusion of the procedure a heart shaped tissue marker clip was deployed into the biopsy cavity. Next, using sterile technique and 1% Lidocaine as local anesthetic, under direct ultrasound visualization, a 14 gauge spring-loaded device was used to perform biopsy of abnormal right axillary lymph node using a inferior approach. At the conclusion of the procedure a HydroMARK tissue marker clip was  deployed into the biopsy cavity. Follow up 2 view mammogram was performed and dictated separately. IMPRESSION: Ultrasound guided biopsy of right breast 5:30 o'clock mass and an abnormal right axillary lymph node. No apparent complications. Electronically Signed: By: Fidela Salisbury M.D. On: 10/25/2018 13:05   [place] Rt Breast Bx W Loc Dev Ea Add Lesion Img Bx Spec US Guide  Addendum Date: 10/28/2018   ADDENDUM REPORT: 10/28/2018 13:32 ADDENDUM: PATHOLOGY revealed: A. BREAST, RIGHT AT 530 O'CLOCK, 2 CM FROM THE NIPPLE; INVASIVE MAMMARY CARCINOMA, NO SPECIAL TYPE. 10 mm in this sample. Grade 3. Ductal carcinoma in situ: Present, high-grade with comedonecrosis. Lymphovascular invasion: Not identified. B. LYMPH NODE, RIGHT AXILLARY; - INVOLVED BY METASTATIC CARCINOMA, MEASURING AT LEAST 10 MM IN GREATEST EXTENT. - NO RESIDUAL LYMPHOID TISSUE IDENTIFIED IN THIS SAMPLE. Pathology results are CONCORDANT with imaging findings, per Dr. Fidela Salisbury. Pathology results were discussed with patient via telephone. The patient reported doing well after the biopsy with tenderness at the site. Post biopsy care instructions were reviewed and questions were answered. The patient was encouraged to call [facility] for any additional concerns. Recommendation: Surgical referral. Request for surgical referral was relayed to nurse navigators at [facility] by Electa Sniff RN on 10/28/2018. Addendum by Electa Sniff RN on 10/28/2018. Electronically Signed   By: Franki Cabot M.D.   On: 10/28/2018 13:32   Result Date: 10/28/2018 CLINICAL DATA:  Right breast 5:30 o'clock highly suspicious mass ulcerating through the skin and highly abnormal right axillary lymph node. EXAM: ULTRASOUND GUIDED RIGHT BREAST CORE NEEDLE BIOPSY COMPARISON:  Previous exam(s). FINDINGS: I met with the patient and we discussed the procedure of ultrasound-guided biopsy, including benefits and alternatives. We discussed the high  likelihood  of a successful procedure. We discussed the risks of the procedure, including infection, bleeding, tissue injury, clip migration, and inadequate sampling. Informed written consent was given. The usual time-out protocol was performed immediately prior to the procedure. Lesion quadrant: Lower inner quadrant and lower outer quadrant Using sterile technique and 1% Lidocaine as local anesthetic, under direct ultrasound visualization, a 14 gauge spring-loaded device was used to perform biopsy of right breast 5:30 o'clock mass using a lateral approach. At the conclusion of the procedure a heart shaped tissue marker clip was deployed into the biopsy cavity. Next, using sterile technique and 1% Lidocaine as local anesthetic, under direct ultrasound visualization, a 14 gauge spring-loaded device was used to perform biopsy of abnormal right axillary lymph node using a inferior approach. At the conclusion of the procedure a HydroMARK tissue marker clip was deployed into the biopsy cavity. Follow up 2 view mammogram was performed and dictated separately. IMPRESSION: Ultrasound guided biopsy of right breast 5:30 o'clock mass and an abnormal right axillary lymph node. No apparent complications. Electronically Signed: By: Fidela Salisbury M.D. On: 10/25/2018 13:05     Assessment and plan- Patient is a 33 y.o. female with newly diagnosed stage IV triple negative right breast cancer stage IV c3 CN3CM1 with bone metastases.  She is here for on treatment assessment prior to cycle 1 of AC chemotherapy  Counts okay to proceed with cycle 1 of AC chemotherapy today.  She will return to clinic tomorrow to receive growth factor support.  Again discussed risks and benefits of chemotherapy including all but not limited to fatigue, nausea, vomiting, low blood counts, risk of infections and hospitalization.  Her baseline echocardiogram was normal.  I will see her back in 3 weeks time for cycle 2.  Plan is to give her 4 cycles  followed by repeat scans.  NGS and PDL 1 testing is currently pending.  Genetic referral has been made and patient is waiting to hear back from that  I also did get in touch with radiation oncology to see if she would be a candidate for palliative radiation treatment to her right hip.  I am waiting to hear back from them regarding their recommendations.  She is yet to see a dentist to obtain dental clearance for starting Xgeva.  Microcytic iron deficiency anemia: She has received 1 dose of Feraheme already and will receive her second dose today.   Visit Diagnosis 1. Encounter for antineoplastic chemotherapy   2. Malignant neoplasm of lower-outer quadrant of right breast of female, estrogen receptor negative (Venturia)   3. Iron deficiency anemia, unspecified iron deficiency anemia type      Dr. Randa Evens, MD, MPH [facility] [phone] 11/12/2018 9:15 AM

## 2018-11-12 NOTE — Progress Notes (Signed)
Pt here for first treatment. She has pain about 5 today at breast area and ribs.

## 2018-11-13 ENCOUNTER — Other Ambulatory Visit: Payer: Self-pay

## 2018-11-13 ENCOUNTER — Inpatient Hospital Stay: Payer: Medicaid Other

## 2018-11-13 DIAGNOSIS — C50511 Malignant neoplasm of lower-outer quadrant of right female breast: Secondary | ICD-10-CM

## 2018-11-13 DIAGNOSIS — Z171 Estrogen receptor negative status [ER-]: Secondary | ICD-10-CM

## 2018-11-13 DIAGNOSIS — C7951 Secondary malignant neoplasm of bone: Secondary | ICD-10-CM | POA: Diagnosis not present

## 2018-11-13 MED ORDER — PEGFILGRASTIM-JMDB 6 MG/0.6ML ~~LOC~~ SOSY
6.0000 mg | PREFILLED_SYRINGE | Freq: Once | SUBCUTANEOUS | Status: AC
  Administered 2018-11-13: 6 mg via SUBCUTANEOUS
  Filled 2018-11-13: qty 0.6

## 2018-11-14 ENCOUNTER — Other Ambulatory Visit: Payer: Self-pay | Admitting: *Deleted

## 2018-11-14 ENCOUNTER — Telehealth: Payer: Self-pay

## 2018-11-14 DIAGNOSIS — C50919 Malignant neoplasm of unspecified site of unspecified female breast: Secondary | ICD-10-CM

## 2018-11-14 NOTE — Telephone Encounter (Signed)
Telephone call to patient for follow up after first chemotherapy infusion but no answer.   Mail box was full so unable to leave message.   Will try to call again later.

## 2018-11-15 ENCOUNTER — Telehealth: Payer: Self-pay | Admitting: Licensed Clinical Social Worker

## 2018-11-15 NOTE — Telephone Encounter (Signed)
Called patient to schedule genetics appointment. She would prefer virtual and agreed to be seen on 9/15 at 9 am.

## 2018-11-18 ENCOUNTER — Telehealth: Payer: Self-pay | Admitting: Genetic Counselor

## 2018-11-18 NOTE — Telephone Encounter (Signed)
Confirmed virtual appt with pt on 9/14 and verified demographics

## 2018-11-18 NOTE — Telephone Encounter (Signed)
Called patient regarding upcoming Webex appointment, patient's voicemail is full. Set up Webex per notes and invite has been sent.

## 2018-11-19 ENCOUNTER — Ambulatory Visit
Admission: RE | Admit: 2018-11-19 | Discharge: 2018-11-19 | Disposition: A | Discharge status: Home / Self Care | Payer: Medicaid Other | Source: Ambulatory Visit | Attending: Oncology | Admitting: Oncology

## 2018-11-19 ENCOUNTER — Ambulatory Visit: Admission: RE | Admit: 2018-11-19 | Payer: Medicaid Other | Source: Ambulatory Visit

## 2018-11-19 ENCOUNTER — Other Ambulatory Visit: Payer: Self-pay | Admitting: Oncology

## 2018-11-19 ENCOUNTER — Other Ambulatory Visit: Payer: Self-pay

## 2018-11-19 ENCOUNTER — Inpatient Hospital Stay: Payer: Medicaid Other

## 2018-11-19 ENCOUNTER — Ambulatory Visit: Payer: Medicaid Other | Admitting: Radiation Oncology

## 2018-11-19 ENCOUNTER — Inpatient Hospital Stay: Payer: Medicaid Other | Attending: Genetic Counselor | Admitting: Genetic Counselor

## 2018-11-19 DIAGNOSIS — C50511 Malignant neoplasm of lower-outer quadrant of right female breast: Secondary | ICD-10-CM

## 2018-11-19 DIAGNOSIS — C799 Secondary malignant neoplasm of unspecified site: Secondary | ICD-10-CM

## 2018-11-19 DIAGNOSIS — Z171 Estrogen receptor negative status [ER-]: Secondary | ICD-10-CM | POA: Insufficient documentation

## 2018-11-19 MED ORDER — GADOBUTROL 1 MMOL/ML IV SOLN
5.0000 mL | Freq: Once | INTRAVENOUS | Status: AC | PRN
  Administered 2018-11-19: 5 mL via INTRAVENOUS

## 2018-11-20 ENCOUNTER — Telehealth: Payer: Self-pay | Admitting: *Deleted

## 2018-11-20 NOTE — Telephone Encounter (Signed)
Got a secure chat message that pt having a tooth issue and I need to call. I called the pt. And she states that she has bad teeth. Her left bottom tooth in the back of her mouth is painful, and swollen. She says her jaw on that side feels swollen. She does not have any open sores in her mouth and she feels like the tooth probably needs to get taken out. She does not remember the dentist that she has been to before. I told her that I would ask Dr. Janese Banks and let her know what to do and she is agreeable to  This.

## 2018-11-21 ENCOUNTER — Other Ambulatory Visit: Payer: Self-pay | Admitting: *Deleted

## 2018-11-21 MED ORDER — AMOXICILLIN-POT CLAVULANATE 875-125 MG PO TABS: 1.0000 | ORAL_TABLET | Freq: Two times a day (BID) | ORAL | 0 refills | Status: DC

## 2018-11-21 NOTE — Telephone Encounter (Signed)
We can start her on augmentin for 7 days but she needs to see a dentist soon. If tooth needs to be pulled she can do so but we need to check cbc a day prior

## 2018-11-21 NOTE — Telephone Encounter (Signed)
Called and spoke to patient and she still is having the tooth problem and it still hurting her.  I told her that Dr. Janese Banks said we can send in some Augmentin for her to make sure is not infected.  The patient needs to go see a dentist as soon as possible to see what the problem is.  If her tooth is needing to be pulled we would like to have her come and get a CBC 1 day prior to her getting the tooth removed so we can make sure her counts and does not put her in a vulnerable situation from infection standpoint.  Patient states she will find a dentist and call them and let us know the update.  She wants me to [facility] and she now will pay for the co-pay on the [employer].

## 2018-11-25 ENCOUNTER — Ambulatory Visit
Admission: RE | Admit: 2018-11-25 | Discharge: 2018-11-25 | Disposition: A | Discharge status: Home / Self Care | Payer: Medicaid Other | Source: Ambulatory Visit | Attending: Radiation Oncology | Admitting: Radiation Oncology

## 2018-11-25 ENCOUNTER — Other Ambulatory Visit: Payer: Self-pay

## 2018-11-25 ENCOUNTER — Encounter: Payer: Self-pay | Admitting: Radiation Oncology

## 2018-11-25 ENCOUNTER — Telehealth: Payer: Self-pay | Admitting: Licensed Clinical Social Worker

## 2018-11-25 VITALS — BP 121/81 | HR 86 | Temp 96.9°F | Resp 16 | Wt 108.7 lb

## 2018-11-25 DIAGNOSIS — Z9221 Personal history of antineoplastic chemotherapy: Secondary | ICD-10-CM | POA: Diagnosis not present

## 2018-11-25 DIAGNOSIS — Z79899 Other long term (current) drug therapy: Secondary | ICD-10-CM | POA: Insufficient documentation

## 2018-11-25 DIAGNOSIS — Z171 Estrogen receptor negative status [ER-]: Secondary | ICD-10-CM | POA: Diagnosis not present

## 2018-11-25 DIAGNOSIS — C50511 Malignant neoplasm of lower-outer quadrant of right female breast: Secondary | ICD-10-CM | POA: Diagnosis not present

## 2018-11-25 DIAGNOSIS — D649 Anemia, unspecified: Secondary | ICD-10-CM | POA: Insufficient documentation

## 2018-11-25 DIAGNOSIS — C7951 Secondary malignant neoplasm of bone: Secondary | ICD-10-CM | POA: Insufficient documentation

## 2018-11-25 DIAGNOSIS — F17210 Nicotine dependence, cigarettes, uncomplicated: Secondary | ICD-10-CM | POA: Diagnosis not present

## 2018-11-25 NOTE — Consult Note (Signed)
NEW PATIENT EVALUATION  Name: [name]  MRN: [id number]  Date:   11/25/2018     DOB: 1986-02-07   This 33 y.o. 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  REFERRING PHYSICIAN: Bunnie Pion, FNP  CHIEF COMPLAINT:  Chief Complaint  Patient presents with  . Cancer    Initial consultation    DIAGNOSIS: The encounter diagnosis was Bone metastasis ([place]).   PREVIOUS INVESTIGATIONS:  PET CT scan reviewed Pathology report reviewed Clinical notes reviewed  HPI: Patient is a 33 year old female who presented with a 4.5 mass in the right breast with satellite nodules as well as lymphadenopathy.  She delayed treatment although on time of biopsy she had invasive mammary carcinoma with mass invading through into her right chest wall.  PET CT scan showed multifocal hypermetabolic activity in the right breast as well as chest wall involvement.  There were right axillary and supraclavicular mediastinal and bilateral internal mammary nodes.  She also had bone metastasis at the [place]weet T12 vertebral body as well as hypermetabolic [place] in the right acetabulum.  She has been having some pain is currently on nonnarcotic medication.  She is ambulating fairly well.  I been asked to evaluate her for possible palliative radiation therapy.  She states she is not having any chest wall pain or discomfort at this time.  She has been started on systemic chemotherapy with some relief of chest wall pain.  She is currently completed 1 cycle of AC.  PLANNED TREATMENT REGIMEN: Hypofractionated palliative radiation therapy to right hip  PAST MEDICAL HISTORY:  has a past medical history of Anemia and Breast cancer ([place]).    PAST SURGICAL HISTORY:  Past Surgical History:  Procedure Laterality Date  . BREAST BIOPSY Right 10/25/2018   Heart Clip, Pending path  . BREAST BIOPSY Right  10/25/2018   Lymph node biopsy, Hydromarker (butterfly), path pending  . CESAREAN SECTION    . CESAREAN SECTION N/A 04/24/2016   Procedure: REPEAT CESAREAN SECTION WITH BILATERAL TUBALIGATION;  Surgeon: Brayton Mars, MD;  Location: [facility] ORS;  Service: Obstetrics;  Laterality: N/A;  Baby Boy born @ 29 Apgars:8/9 Weight: 8lb 9oz  . PORTACATH PLACEMENT Left 11/05/2018   Procedure: INSERTION PORT-A-CATH;  Surgeon: Jules Husbands, MD;  Location: [facility] ORS;  Service: General;  Laterality: Left;    FAMILY HISTORY: family history includes Cancer in her maternal grandmother; Diabetes in her maternal grandfather; Heart disease in her maternal grandfather; Hypertension in her maternal grandfather and mother; Stroke in her father.  SOCIAL HISTORY:  reports that she has been smoking cigarettes. She has a 1.25 pack-year smoking history. She has never used smokeless tobacco. She reports that she does not drink alcohol or use drugs.  ALLERGIES: Patient has no known allergies.  MEDICATIONS:  Current Outpatient Medications  Medication Sig Dispense Refill  . amoxicillin-clavulanate (AUGMENTIN) 875-125 MG tablet Take 1 tablet by mouth 2 (two) times daily. 14 tablet 0  . dexamethasone (DECADRON) 4 MG tablet Take 2 tablets by mouth once a day on the day after chemotherapy and then take 2 tablets two times a day for 2 days. Take with food. 30 tablet 1  . gabapentin (NEURONTIN) 300 MG capsule Take 1 capsule (300 mg total) by mouth 3 (three) times daily. 90 capsule 0  . lidocaine-prilocaine (EMLA) cream Apply 1 application topically as directed. Apply small amount over port site  1 hour before treatments, cover the cream with saran wrap to protect your clothing 30 g 0  . LORazepam (ATIVAN) 0.5 MG tablet Take 1 tablet (0.5 mg total) by mouth every 6 (six) hours as needed (Nausea or vomiting). 30 tablet 0  . ondansetron (ZOFRAN) 8 MG tablet Take 1 tablet (8 mg total) by mouth 2 (two) times daily as needed. Start  on the third day after chemotherapy. 30 tablet 1  . Oxycodone HCl 10 MG TABS Take 1 tablet (10 mg total) by mouth every 4 (four) hours as needed. 120 tablet 0  . prochlorperazine (COMPAZINE) 10 MG tablet Take 1 tablet (10 mg total) by mouth every 6 (six) hours as needed (Nausea or vomiting). 30 tablet 1   No current facility-administered medications for this encounter.     ECOG PERFORMANCE STATUS:  1 - Symptomatic but completely ambulatory  REVIEW OF SYSTEMS: Patient denies any weight loss, fatigue, weakness, fever, chills or night sweats. Patient denies any loss of vision, blurred vision. Patient denies any ringing  of the ears or hearing loss. No irregular heartbeat. Patient denies heart murmur or history of fainting. Patient denies any chest pain or pain radiating to her upper extremities. Patient denies any shortness of breath, difficulty breathing at night, cough or hemoptysis. Patient denies any swelling in the lower legs. Patient denies any nausea vomiting, vomiting of blood, or coffee ground material in the vomitus. Patient denies any stomach pain. Patient states has had normal bowel movements no significant constipation or diarrhea. Patient denies any dysuria, hematuria or significant nocturia. Patient denies any problems walking, swelling in the joints or loss of balance. Patient denies any skin changes, loss of hair or loss of weight. Patient denies any excessive worrying or anxiety or significant depression. Patient denies any problems with insomnia. Patient denies excessive thirst, polyuria, polydipsia. Patient denies any swollen glands, patient denies easy bruising or easy bleeding. Patient denies any recent infections, allergies or URI. Patient "s visual fields have not changed significantly in recent time.   PHYSICAL EXAM: BP 121/81 (BP Location: Left Arm, Patient Position: Sitting)   Pulse 86   Temp (!) 96.9 F (36.1 C) (Tympanic)   Resp 16   Wt 108 lb 11.2 oz (49.3 kg)   BMI  18.37 kg/m  Range of motion of her right lower extremity does not elicit pain.  Deep palpation of her spine does not elicit pain she has no focal neurologic deficits motor or sensory and DTR levels are equal symmetric in the upper lower extremities.  Well-developed well-nourished patient in NAD. HEENT reveals PERLA, EOMI, discs not visualized.  Oral cavity is clear. No oral mucosal lesions are identified. Neck is clear without evidence of cervical or supraclavicular adenopathy. Lungs are clear to A&P. Cardiac examination is essentially unremarkable with regular rate and rhythm without murmur rub or thrill. Abdomen is benign with no organomegaly or masses noted. Motor sensory and DTR levels are equal and symmetric in the upper and lower extremities. Cranial nerves II through XII are grossly intact. Proprioception is intact. No peripheral adenopathy or edema is identified. No motor or sensory levels are noted. Crude visual fields are within normal range.  LABORATORY DATA: Pathology report reviewed    RADIOLOGY RESULTS: PET CT scan reviewed and compatible with above-stated findings   IMPRESSION: Stage IV invasive mammary carcinoma of the right breast in 33 year old female with metastatic disease to right acetabulum  PLAN: At this time like to go with a hypofractionated course of radiation  therapy.  Would plan on delivering 2000 cGy in 5 fractions.  Risks and benefits of treatment occluding skin reaction fatigue alteration of blood counts all were discussed in detail.  I will be meticulous and not treating small bowel with this hypofractionated course of treatment.  I have chosen a more extended hypofractionated course than single fraction based on the fact that this is a weightbearing area.  Patient comprehends my treatment plan well.  I would like to take this opportunity to thank you for allowing me to participate in the care of your patient.Noreene Filbert, MD

## 2018-11-26 ENCOUNTER — Encounter: Payer: Self-pay | Admitting: Oncology

## 2018-11-27 ENCOUNTER — Ambulatory Visit
Admission: RE | Admit: 2018-11-27 | Discharge: 2018-11-27 | Disposition: A | Discharge status: Home / Self Care | Payer: Medicaid Other | Source: Ambulatory Visit | Attending: Radiation Oncology | Admitting: Radiation Oncology

## 2018-11-27 ENCOUNTER — Other Ambulatory Visit: Payer: Self-pay

## 2018-11-27 ENCOUNTER — Telehealth: Payer: Self-pay | Admitting: *Deleted

## 2018-11-27 DIAGNOSIS — Z171 Estrogen receptor negative status [ER-]: Secondary | ICD-10-CM | POA: Insufficient documentation

## 2018-11-27 DIAGNOSIS — Z510 Encounter for antineoplastic radiation therapy: Secondary | ICD-10-CM | POA: Insufficient documentation

## 2018-11-27 DIAGNOSIS — C7951 Secondary malignant neoplasm of bone: Secondary | ICD-10-CM | POA: Insufficient documentation

## 2018-11-27 DIAGNOSIS — C50511 Malignant neoplasm of lower-outer quadrant of right female breast: Secondary | ICD-10-CM | POA: Insufficient documentation

## 2018-11-27 NOTE — Telephone Encounter (Signed)
[facility] office at [phone] and they do accept medicaid and gave pt. appt for tom 8:45. I faxed the office her demographic info including her medicaid #, and notes about her dx and tooth issue. I told office that it they need a copy of card the pt. Would have to give it to them. We only have the number. I called the pt. Asked her if she can go to dentist tom. Am and she can. I gave her the phone number and address to the pt. She will put it in her phone and be able to get there.  I also told dentist office that we would like the pt. To be evaluated and before she has any work done that we would like cbc to make sure her blood counts are ok. She is going through chemotherapy for her breast cancer

## 2018-11-28 DIAGNOSIS — Z510 Encounter for antineoplastic radiation therapy: Secondary | ICD-10-CM | POA: Diagnosis not present

## 2018-12-02 ENCOUNTER — Ambulatory Visit
Admission: RE | Admit: 2018-12-02 | Discharge: 2018-12-02 | Disposition: A | Discharge status: Home / Self Care | Payer: Medicaid Other | Source: Ambulatory Visit | Attending: Radiation Oncology | Admitting: Radiation Oncology

## 2018-12-02 ENCOUNTER — Other Ambulatory Visit: Payer: Self-pay

## 2018-12-02 DIAGNOSIS — Z510 Encounter for antineoplastic radiation therapy: Secondary | ICD-10-CM | POA: Diagnosis not present

## 2018-12-02 NOTE — Progress Notes (Signed)
Called patient, no answer-left message.

## 2018-12-03 ENCOUNTER — Inpatient Hospital Stay (HOSPITAL_BASED_OUTPATIENT_CLINIC_OR_DEPARTMENT_OTHER): Payer: Medicaid Other | Admitting: Oncology

## 2018-12-03 ENCOUNTER — Ambulatory Visit
Admission: RE | Admit: 2018-12-03 | Discharge: 2018-12-03 | Disposition: A | Discharge status: Home / Self Care | Payer: Medicaid Other | Source: Ambulatory Visit | Attending: Radiation Oncology | Admitting: Radiation Oncology

## 2018-12-03 ENCOUNTER — Other Ambulatory Visit: Payer: Self-pay | Admitting: *Deleted

## 2018-12-03 ENCOUNTER — Inpatient Hospital Stay: Payer: Medicaid Other

## 2018-12-03 ENCOUNTER — Other Ambulatory Visit: Payer: Self-pay

## 2018-12-03 ENCOUNTER — Encounter: Payer: Self-pay | Admitting: Oncology

## 2018-12-03 VITALS — BP 113/71 | HR 90 | Temp 98.7°F | Resp 16 | Ht 64.5 in | Wt 108.8 lb

## 2018-12-03 DIAGNOSIS — C50511 Malignant neoplasm of lower-outer quadrant of right female breast: Secondary | ICD-10-CM

## 2018-12-03 DIAGNOSIS — D509 Iron deficiency anemia, unspecified: Secondary | ICD-10-CM

## 2018-12-03 DIAGNOSIS — D638 Anemia in other chronic diseases classified elsewhere: Secondary | ICD-10-CM

## 2018-12-03 DIAGNOSIS — Z510 Encounter for antineoplastic radiation therapy: Secondary | ICD-10-CM | POA: Diagnosis not present

## 2018-12-03 DIAGNOSIS — Z171 Estrogen receptor negative status [ER-]: Secondary | ICD-10-CM

## 2018-12-03 DIAGNOSIS — G893 Neoplasm related pain (acute) (chronic): Secondary | ICD-10-CM | POA: Diagnosis not present

## 2018-12-03 DIAGNOSIS — C7951 Secondary malignant neoplasm of bone: Secondary | ICD-10-CM | POA: Diagnosis not present

## 2018-12-03 DIAGNOSIS — Z5111 Encounter for antineoplastic chemotherapy: Secondary | ICD-10-CM | POA: Diagnosis not present

## 2018-12-03 LAB — CBC WITH DIFFERENTIAL/PLATELET
Abs Immature Granulocytes: 0.08 10*3/uL — ABNORMAL HIGH (ref 0.00–0.07)
Basophils Absolute: 0.1 10*3/uL (ref 0.0–0.1)
Basophils Relative: 1 %
Eosinophils Absolute: 0 10*3/uL (ref 0.0–0.5)
Eosinophils Relative: 0 %
HCT: 29.8 % — ABNORMAL LOW (ref 36.0–46.0)
Hemoglobin: 9.2 g/dL — ABNORMAL LOW (ref 12.0–15.0)
Immature Granulocytes: 1 %
Lymphocytes Relative: 18 %
Lymphs Abs: 1.9 10*3/uL (ref 0.7–4.0)
MCH: 21.9 pg — ABNORMAL LOW (ref 26.0–34.0)
MCHC: 30.9 g/dL (ref 30.0–36.0)
MCV: 70.8 fL — ABNORMAL LOW (ref 80.0–100.0)
Monocytes Absolute: 0.9 10*3/uL (ref 0.1–1.0)
Monocytes Relative: 8 %
Neutro Abs: 7.7 10*3/uL (ref 1.7–7.7)
Neutrophils Relative %: 72 %
Platelets: 513 10*3/uL — ABNORMAL HIGH (ref 150–400)
RBC: 4.21 MIL/uL (ref 3.87–5.11)
RDW: 30.3 % — ABNORMAL HIGH (ref 11.5–15.5)
WBC: 10.5 10*3/uL (ref 4.0–10.5)
nRBC: 0 % (ref 0.0–0.2)

## 2018-12-03 LAB — COMPREHENSIVE METABOLIC PANEL
ALT: 19 U/L (ref 0–44)
AST: 23 U/L (ref 15–41)
Albumin: 4.1 g/dL (ref 3.5–5.0)
Alkaline Phosphatase: 74 U/L (ref 38–126)
Anion gap: 12 (ref 5–15)
BUN: 14 mg/dL (ref 6–20)
CO2: 23 mmol/L (ref 22–32)
Calcium: 9.3 mg/dL (ref 8.9–10.3)
Chloride: 103 mmol/L (ref 98–111)
Creatinine, Ser: 0.68 mg/dL (ref 0.44–1.00)
GFR calc Af Amer: >60 mL/min (ref >60)
GFR calc non Af Amer: >60 mL/min (ref >60)
Glucose, Bld: 122 mg/dL — ABNORMAL HIGH (ref 70–99)
Potassium: 3.6 mmol/L (ref 3.5–5.1)
Sodium: 138 mmol/L (ref 135–145)
Total Bilirubin: 0.4 mg/dL (ref 0.3–1.2)
Total Protein: 7.8 g/dL (ref 6.5–8.1)

## 2018-12-03 MED ORDER — HEPARIN SOD (PORK) LOCK FLUSH 100 UNIT/ML IV SOLN
500.0000 [IU] | Freq: Once | INTRAVENOUS | Status: AC | PRN
  Administered 2018-12-03: 12:00:00 500 [IU]
  Filled 2018-12-03: qty 5

## 2018-12-03 MED ORDER — SODIUM CHLORIDE 0.9 % IV SOLN
Freq: Once | INTRAVENOUS | Status: AC
  Administered 2018-12-03: 10:00:00 via INTRAVENOUS
  Filled 2018-12-03: qty 250

## 2018-12-03 MED ORDER — OLANZAPINE 10 MG PO TABS: 10.0000 mg | ORAL_TABLET | Freq: Every day | ORAL | 0 refills | Status: AC

## 2018-12-03 MED ORDER — SODIUM CHLORIDE 0.9 % IV SOLN
Freq: Once | INTRAVENOUS | Status: AC
  Administered 2018-12-03: 10:00:00 via INTRAVENOUS
  Filled 2018-12-03: qty 5

## 2018-12-03 MED ORDER — SODIUM CHLORIDE 0.9% FLUSH
10.0000 mL | INTRAVENOUS | Status: DC | PRN
  Administered 2018-12-03: 10 mL via INTRAVENOUS
  Filled 2018-12-03: qty 10

## 2018-12-03 MED ORDER — OXYCODONE HCL 10 MG PO TABS: 10.0000 mg | ORAL_TABLET | ORAL | 0 refills | Status: DC | PRN

## 2018-12-03 MED ORDER — PALONOSETRON HCL INJECTION 0.25 MG/5ML
0.2500 mg | Freq: Once | INTRAVENOUS | Status: AC
  Administered 2018-12-03: 0.25 mg via INTRAVENOUS
  Filled 2018-12-03: qty 5

## 2018-12-03 MED ORDER — SODIUM CHLORIDE 0.9 % IV SOLN
600.0000 mg/m2 | Freq: Once | INTRAVENOUS | Status: AC
  Administered 2018-12-03: 900 mg via INTRAVENOUS
  Filled 2018-12-03: qty 45

## 2018-12-03 MED ORDER — DOXORUBICIN HCL CHEMO IV INJECTION 2 MG/ML
60.0000 mg/m2 | Freq: Once | INTRAVENOUS | Status: AC
  Administered 2018-12-03: 11:00:00 90 mg via INTRAVENOUS
  Filled 2018-12-03: qty 45

## 2018-12-03 NOTE — Progress Notes (Signed)
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 After repeated pursuadingefforts She went for a mammogram and ultrasound on 09/18/2018. This showed a highly suspicious mass in the right breast 4.5 cm at the 5:30 position as well as a 9 mm satellite mass along with 5 abnormal lymph nodes. However she did not pursue this with a biopsy until a month later and had a biopsy on 10/25/2018 which showed invasive mammary carcinoma grade 3 and at the time of my visit today her ER PR and HER-2 status is pending. She also noticed that the mass had grown in size and invading through her right chest wall. She is a single mom of 4 kids. She is currently unemployed her parents live close by and help her out.  PET CT scan showed multifocal FDG avid right breast lesions with chest wall involvement along with hypermetabolic right axillary supraclavicular superior mediastinal and bilateral internal mammary nodal metastases.Hypermetabolic bone metastases was identified in the body of the sternum, T12 vertebral as well as lesions in the right iliac bone and a large FDG avid right acetabular lesion without a correlating CT abnormality  Interval history-she still reports pain and discharge from the wound in her right breast.  She has been using PRN  oxycodone about 3-4 times a day.  Also reports pain in her right hip and she started palliative radiation today.  She will be seeing dentist in a week's time.  Reports tooth pain has resolved.  ECOG PS- 1 Pain scale- 0   Review of systems- Review of Systems  Constitutional: Positive for malaise/fatigue. Negative for chills, fever and weight loss.  HENT: Negative for congestion, ear discharge and nosebleeds.   Eyes: Negative for blurred vision.  Respiratory: Negative for cough, hemoptysis, sputum production, shortness of breath and wheezing.   Cardiovascular: Negative for chest pain, palpitations, orthopnea and claudication.  Gastrointestinal: Negative for abdominal pain, blood in stool, constipation, diarrhea, heartburn, melena, nausea and vomiting.  Genitourinary: Negative for dysuria, flank pain, frequency, hematuria and urgency.  Musculoskeletal: Positive for joint pain (Right hip pain). Negative for back pain and myalgias.  Skin: Negative for rash.  Neurological: Negative for dizziness, tingling, focal weakness, seizures, weakness and headaches.  Endo/Heme/Allergies: Does not bruise/bleed easily.  Psychiatric/Behavioral: Negative for depression and suicidal ideas. The patient does not have insomnia.   Draining wound  right breast undersurface   No Known Allergies   Past Medical History:  Diagnosis Date  . Anemia   . Breast cancer [facility])      Past Surgical History:  Procedure Laterality Date  . BREAST BIOPSY Right 10/25/2018   Heart Clip, Pending path  . BREAST BIOPSY Right 10/25/2018   Lymph node biopsy, Hydromarker (butterfly), path pending  . CESAREAN SECTION    . CESAREAN SECTION N/A 04/24/2016   Procedure: REPEAT CESAREAN SECTION WITH BILATERAL TUBALIGATION;  Surgeon:  Brayton Mars, MD;  Location: [facility] ORS;  Service: Obstetrics;  Laterality: N/A;  Baby Boy born @ 27 Apgars:8/9 Weight: 8lb 9oz  . PORTACATH PLACEMENT Left 11/05/2018   Procedure: INSERTION  PORT-A-CATH;  Surgeon: Jules Husbands, MD;  Location: [facility] ORS;  Service: General;  Laterality: Left;    Social History   Socioeconomic History  . Marital status: Single    Spouse name: Not on file  . Number of children: Not on file  . Years of education: Not on file  . Highest education level: Not on file  Occupational History  . Not on file  Social Needs  . Financial resource strain: Not on file  . Food insecurity    Worry: Not on file    Inability: Not on file  . Transportation needs    Medical: Not on file    Non-medical: Not on file  Tobacco Use  . Smoking status: Current Every Day Smoker    Packs/day: 0.25    Years: 5.00    Pack years: 1.25    Types: Cigarettes  . Smokeless tobacco: Never Used  Substance and Sexual Activity  . Alcohol use: No  . Drug use: No  . Sexual activity: Yes  Lifestyle  . Physical activity    Days per week: Not on file    Minutes per session: Not on file  . Stress: Not on file  Relationships  . Social Herbalist on phone: Not on file    Gets together: Not on file    Attends religious service: Not on file    Active member of club or organization: Not on file    Attends meetings of clubs or organizations: Not on file    Relationship status: Not on file  . Intimate partner violence    Fear of current or ex partner: Not on file    Emotionally abused: Not on file    Physically abused: Not on file    Forced sexual activity: Not on file  Other Topics Concern  . Not on file  Social History Narrative  . Not on file    Family History  Problem Relation Age of Onset  . Hypertension Mother   . Stroke Father   . Cancer Maternal Grandmother   . Diabetes Maternal Grandfather   . Heart disease Maternal Grandfather   . Hypertension Maternal Grandfather      Current Outpatient Medications:  .  amoxicillin-clavulanate (AUGMENTIN) 875-125 MG tablet, Take 1 tablet by mouth 2 (two) times daily., Disp: 14 tablet, Rfl: 0 .   dexamethasone (DECADRON) 4 MG tablet, Take 2 tablets by mouth once a day on the day after chemotherapy and then take 2 tablets two times a day for 2 days. Take with food., Disp: 30 tablet, Rfl: 1 .  gabapentin (NEURONTIN) 300 MG capsule, Take 1 capsule (300 mg total) by mouth 3 (three) times daily., Disp: 90 capsule, Rfl: 0 .  lidocaine-prilocaine (EMLA) cream, Apply 1 application topically as directed. Apply small amount over port site 1 hour before treatments, cover the cream with saran wrap to protect your clothing, Disp: 30 g, Rfl: 0 .  LORazepam (ATIVAN) 0.5 MG tablet, Take 1 tablet (0.5 mg total) by mouth every 6 (six) hours as needed (Nausea or vomiting)., Disp: 30 tablet, Rfl: 0 .  ondansetron (ZOFRAN) 8 MG tablet, Take 1 tablet (8 mg total) by mouth 2 (two) times daily as needed. Start on the third day after chemotherapy., Disp: 30  tablet, Rfl: 1 .  Oxycodone HCl 10 MG TABS, Take 1 tablet (10 mg total) by mouth every 4 (four) hours as needed., Disp: 120 tablet, Rfl: 0 .  prochlorperazine (COMPAZINE) 10 MG tablet, Take 1 tablet (10 mg total) by mouth every 6 (six) hours as needed (Nausea or vomiting)., Disp: 30 tablet, Rfl: 1 No current facility-administered medications for this visit.   Facility-Administered Medications Ordered in Other Visits:  .  sodium chloride flush (NS) 0.9 % injection 10 mL, 10 mL, Intravenous, PRN, Sindy Guadeloupe, MD, 10 mL at 12/03/18 0849  Physical exam: There were no vitals filed for this visit. Physical Exam Constitutional:      Comments: Patient is thin and fatigued.  Appears in no acute distress  HENT:     Head: Normocephalic and atraumatic.  Eyes:     Pupils: Pupils are equal, round, and reactive to light.  Neck:     Musculoskeletal: Normal range of motion.  Cardiovascular:     Rate and Rhythm: Normal rate and regular rhythm.     Heart sounds: Normal heart sounds.  Pulmonary:     Effort: Pulmonary effort is normal.     Breath sounds: Normal breath  sounds.  Abdominal:     General: Bowel sounds are normal.     Palpations: Abdomen is soft.  Skin:    General: Skin is warm and dry.  Neurological:     Mental Status: She is alert and oriented to person, place, and time.   There is a large oval ulceration about 3 cm in size in the inferior portion of the right breast which is unchanged as compared to before.  Palpable right axillary adenopathy.  CMP Latest Ref Rng & Units 11/12/2018  Glucose 70 - 99 mg/dL 136(H)  BUN 6 - 20 mg/dL 13  Creatinine 0.44 - 1.00 mg/dL 0.74  Sodium 135 - 145 mmol/L 137  Potassium 3.5 - 5.1 mmol/L 3.5  Chloride 98 - 111 mmol/L 103  CO2 22 - 32 mmol/L 25  Calcium 8.9 - 10.3 mg/dL 9.2  Total Protein 6.5 - 8.1 g/dL 7.8  Total Bilirubin 0.3 - 1.2 mg/dL 0.5  Alkaline Phos 38 - 126 U/L 61  AST 15 - 41 U/L 18  ALT 0 - 44 U/L 11   CBC Latest Ref Rng & Units 11/12/2018  WBC 4.0 - 10.5 K/uL 9.2  Hemoglobin 12.0 - 15.0 g/dL 9.0(L)  Hematocrit 36.0 - 46.0 % 29.2(L)  Platelets 150 - 400 K/uL 452(H)    No images are attached to the encounter.  Dg Chest 1 View  Result Date: 11/05/2018 CLINICAL DATA:  Breast cancer.  Port insertion. EXAM: CHEST  1 VIEW COMPARISON:  None. FINDINGS: Heart size is normal. Power port inserted on the left via a subclavian approach with the tip at the SVC RA junction. No pneumothorax. Lungs remain clear. No bone abnormality. IMPRESSION: Power port tip at the SVC RA junction. No pneumothorax. No active disease. Electronically Signed   By: Nelson Chimes M.D.   On: 11/05/2018 10:25   Dg Pelvis 1-2 Views  Result Date: 11/20/2018 CLINICAL DATA:  New breast cancer metastatic disease to right pelvic bones. Evaluate for fracture. EXAM: PELVIS - 1-2 VIEW COMPARISON:  PET-CT 11/04/2018 FINDINGS: Examination demonstrates no evidence of fracture or dislocation. There is a 1.2 cm well-defined sclerotic focus of the right superior pubic ramus without significant uptake on PET-CT as this likely represents a  bone island. Remainder the exam is unremarkable. IMPRESSION: No  acute fracture. 1.2 cm sclerotic focus over the right superior pubic ramus without uptake on PET-CT. Likely represents a bone island. Electronically Signed   By: Marin Olp M.D.   On: 11/20/2018 10:23   Mr Jeri Cos XT Contrast  Result Date: 11/20/2018 CLINICAL DATA:  New diagnosis of metastatic breast cancer.  Staging. EXAM: MRI HEAD WITHOUT AND WITH CONTRAST TECHNIQUE: Multiplanar, multiecho pulse sequences of the brain and surrounding structures were obtained without and with intravenous contrast. CONTRAST:  77m GADAVIST GADOBUTROL 1 MMOL/ML IV SOLN COMPARISON:  None. FINDINGS: Brain: Diffusion imaging does not show any acute or subacute infarction or other cause of restricted diffusion. Brain has normal appearance without evidence of atrophy, infarction, mass lesion, hemorrhage, hydrocephalus or extra-axial collection. After contrast administration, no abnormal enhancement occurs. Vascular: Major vessels at the base of the brain show flow. Skull and upper cervical spine: Negative Sinuses/Orbits: Clear/normal Other: None IMPRESSION: Normal examination.  No evidence of regional metastatic disease. Electronically Signed   By: MNelson ChimesM.D.   On: 11/20/2018 10:34   Nm Pet Image Initial (pi) Skull Base To Thigh  Result Date: 11/04/2018 CLINICAL DATA:  Initial treatment strategy for breast cancer. EXAM: NUCLEAR MEDICINE PET SKULL BASE TO THIGH TECHNIQUE: 5.9 mCi F-18 FDG was injected intravenously. Full-ring PET imaging was performed from the skull base to thigh after the radiotracer. CT data was obtained and used for attenuation correction and anatomic localization. Fasting blood glucose: 94 mg/dl COMPARISON:  None. FINDINGS: Mediastinal blood pool activity: SUV max 1.9. Liver activity: SUV max NA NECK: No hypermetabolic lymph nodes in the neck. Incidental CT findings: none CHEST: Mass within the inferior right breast measures 3 cm and has  an SUV max of 18.57. There is associated chest wall invasion with tumor involvement of the medial aspect of the pectoralis muscle extending to the pleural surface of the lung. Separate chest wall mass involving the medial chest wall adjacent to the sternum on the right measures approximately 4 cm with SUV max of 8.1. Index right axillary lymph node measures 1.4 cm within SUV max of 16.18. Index right supraclavicular node measures 1.2 cm with SUV max of 9.0. Bilateral FDG avid internal mammary lymph nodes are identified. Index left internal mammary lymph node has an SUV max of 3.7. Low right internal mammary lymph node identified adjacent to the xiphoid process has an SUV max of 4.15. Within the left prevascular region of the superior mediastinum there is an FDG avid lymph node anterior to the arch of the aorta which has an SUV max of 4.35. Incidental CT findings: There are 2 subcentimeter nodules noted within the left lower lobe which measure up to 4 mm, image 103/3. Too small to characterize by PET-CT. ABDOMEN/PELVIS: No abnormal hypermetabolic activity within the liver, pancreas, adrenal glands, or spleen. No hypermetabolic lymph nodes in the abdomen or pelvis. Incidental CT findings: None SKELETON: Multifocal FDG avid bone metastases are identified. Index lesion within the body of sternum without corresponding CT abnormality has an SUV max of 5.4. Index lesion within the T12 vertebra has an SUV max of 5.12. Also without CT correlate. Hypermetabolic lesion within the right iliac bone has an SUV max of 8.43. Large FDG avid right acetabular lesion without corresponding CT abnormality has an SUV max of 13.03. Index lesion within the inferior right pubic rami has an SUV max of 8.36. Incidental CT findings: none IMPRESSION: 1. Multifocal FDG avid right breast lesions with chest wall involvement are identified compatible with primary  breast carcinoma. 2. Hypermetabolic right axillary, right supraclavicular, superior  mediastinal and bilateral internal mammary nodal metastasis. 3. Multifocal FDG avid bone metastasis is identified. 4. Small nodules in the left lower lobe, too small to reliably characterize by PET-CT. Electronically Signed   By: Kerby Moors M.D.   On: 11/04/2018 15:00   Dg C-arm 1-60 Min-no Report  Result Date: 11/05/2018 Fluoroscopy was utilized by the requesting physician.  No radiographic interpretation.   Dg Femur, Min 2 Views Right  Result Date: 11/20/2018 CLINICAL DATA:  New breast cancer metastases to right iliac bone and right acetabulum as well as pelvis. Evaluate for fracture. EXAM: RIGHT FEMUR 2 VIEWS COMPARISON:  PET-CT 11/04/2018. FINDINGS: Examination demonstrates no significant degenerative change. There is no evidence of fracture or dislocation. Linear 1.2 cm sclerotic focus over the right superior pubic ramus without definite uptake in this region on PET-CT. Remainder the exam is unremarkable. IMPRESSION: No acute fracture. 1.2 cm sclerotic focus over the right superior pubic ramus without uptake in this region on PET-CT as this likely represents bone island. Electronically Signed   By: Marin Olp M.D.   On: 11/20/2018 10:22     Assessment and plan- Patient is a 33 y.o. female with stage IV triple negative right breast cancer stage IV c3 CN3CM1 with bone metastases.   She is here for on treatment assessment prior to cycle 2 of AC chemotherapy  Counts okay to proceed with cycle 2 of AC chemotherapy today.  She will return on day 2 for growth factor support..  I will see her back in 3 weeks for cycle 3.  I will plan to get scans after 4 cycles.  If she has disease response I will plan to switch her to single agent Taxol or carbotaxol.  PDL1 was 0%.  I therefore do not think that she would derive much benefit from Abraxane Tecentriq regimen.  Neoplasm related pain: Continue PRN oxycodone.  I did discuss adding long-acting pain medication.  Patient would like to hold off on that at  this time  Bone metastases: She will be undergoing palliative radiation treatment.  She is yet to obtain dental clearance to proceed with bisphosphonates.  Continue pain meds as above.  Iron deficiency anemia: Status post 2 doses of Feraheme on 9/2 and 11/12/2018.  Repeat levels in 6 weeks.  Patient's tooth pain has resolved after she took a course of antibiotics.  She will be seeing her dentist on 12/11/2018.     Visit Diagnosis 1. Malignant neoplasm of lower-outer quadrant of right breast of female, estrogen receptor negative ([place])   2. Neoplasm related pain   3. Bone metastases ([place])   4. Encounter for antineoplastic chemotherapy      Dr. Randa Evens, MD, MPH [facility] [phone] 12/03/2018 12:36 PM

## 2018-12-03 NOTE — Progress Notes (Signed)
pt having burning pain in breast today, she also is hurting in right hip. There are some days where she takes pain med 4 times a day. Needs refill of pain med. Gets nauseated at times. Still smokes about 1 cig. A day

## 2018-12-04 ENCOUNTER — Inpatient Hospital Stay: Payer: Medicaid Other

## 2018-12-04 ENCOUNTER — Ambulatory Visit
Admission: RE | Admit: 2018-12-04 | Discharge: 2018-12-04 | Disposition: A | Discharge status: Home / Self Care | Payer: Medicaid Other | Source: Ambulatory Visit | Attending: Radiation Oncology | Admitting: Radiation Oncology

## 2018-12-04 ENCOUNTER — Other Ambulatory Visit: Payer: Self-pay

## 2018-12-04 DIAGNOSIS — C50511 Malignant neoplasm of lower-outer quadrant of right female breast: Secondary | ICD-10-CM

## 2018-12-04 DIAGNOSIS — Z171 Estrogen receptor negative status [ER-]: Secondary | ICD-10-CM

## 2018-12-04 DIAGNOSIS — Z510 Encounter for antineoplastic radiation therapy: Secondary | ICD-10-CM | POA: Diagnosis not present

## 2018-12-04 DIAGNOSIS — C7951 Secondary malignant neoplasm of bone: Secondary | ICD-10-CM | POA: Diagnosis not present

## 2018-12-04 LAB — CANCER ANTIGEN 27.29: CA 27.29: 92.2 U/mL — ABNORMAL HIGH (ref 0.0–38.6)

## 2018-12-04 MED ORDER — PEGFILGRASTIM-JMDB 6 MG/0.6ML ~~LOC~~ SOSY
6.0000 mg | PREFILLED_SYRINGE | Freq: Once | SUBCUTANEOUS | Status: AC
  Administered 2018-12-04: 6 mg via SUBCUTANEOUS
  Filled 2018-12-04: qty 0.6

## 2018-12-05 ENCOUNTER — Other Ambulatory Visit: Payer: Self-pay

## 2018-12-05 ENCOUNTER — Ambulatory Visit
Admission: RE | Admit: 2018-12-05 | Discharge: 2018-12-05 | Disposition: A | Discharge status: Home / Self Care | Payer: Medicaid Other | Source: Ambulatory Visit | Attending: Radiation Oncology | Admitting: Radiation Oncology

## 2018-12-05 DIAGNOSIS — Z171 Estrogen receptor negative status [ER-]: Secondary | ICD-10-CM | POA: Diagnosis not present

## 2018-12-05 DIAGNOSIS — C50511 Malignant neoplasm of lower-outer quadrant of right female breast: Secondary | ICD-10-CM | POA: Insufficient documentation

## 2018-12-05 DIAGNOSIS — Z510 Encounter for antineoplastic radiation therapy: Secondary | ICD-10-CM | POA: Insufficient documentation

## 2018-12-05 DIAGNOSIS — C7951 Secondary malignant neoplasm of bone: Secondary | ICD-10-CM | POA: Diagnosis not present

## 2018-12-06 ENCOUNTER — Ambulatory Visit
Admission: RE | Admit: 2018-12-06 | Discharge: 2018-12-06 | Disposition: A | Discharge status: Home / Self Care | Payer: Medicaid Other | Source: Ambulatory Visit | Attending: Radiation Oncology | Admitting: Radiation Oncology

## 2018-12-06 ENCOUNTER — Other Ambulatory Visit: Payer: Self-pay

## 2018-12-06 DIAGNOSIS — Z510 Encounter for antineoplastic radiation therapy: Secondary | ICD-10-CM | POA: Diagnosis not present

## 2018-12-06 NOTE — Telephone Encounter (Signed)
Called Ms. Attig twice over past few weeks, reached her today to try to reschedule genetics appointment. She said she is aware she needs to reschedule and will call me to do this at a later date.

## 2018-12-09 ENCOUNTER — Other Ambulatory Visit: Payer: Self-pay

## 2018-12-09 ENCOUNTER — Ambulatory Visit
Admission: RE | Admit: 2018-12-09 | Discharge: 2018-12-09 | Disposition: A | Discharge status: Home / Self Care | Payer: Medicaid Other | Source: Ambulatory Visit | Attending: Radiation Oncology | Admitting: Radiation Oncology

## 2018-12-09 DIAGNOSIS — Z510 Encounter for antineoplastic radiation therapy: Secondary | ICD-10-CM | POA: Diagnosis not present

## 2018-12-12 ENCOUNTER — Encounter: Payer: Self-pay | Admitting: Oncology

## 2018-12-12 ENCOUNTER — Other Ambulatory Visit: Payer: Self-pay

## 2018-12-12 ENCOUNTER — Other Ambulatory Visit: Payer: Self-pay | Admitting: *Deleted

## 2018-12-12 ENCOUNTER — Inpatient Hospital Stay: Payer: Medicaid Other | Attending: Oncology | Admitting: Oncology

## 2018-12-12 ENCOUNTER — Telehealth: Payer: Self-pay | Admitting: *Deleted

## 2018-12-12 VITALS — BP 100/61 | HR 87 | Temp 97.4°F | Resp 16 | Wt 111.0 lb

## 2018-12-12 DIAGNOSIS — F17210 Nicotine dependence, cigarettes, uncomplicated: Secondary | ICD-10-CM | POA: Insufficient documentation

## 2018-12-12 DIAGNOSIS — S21001A Unspecified open wound of right breast, initial encounter: Secondary | ICD-10-CM

## 2018-12-12 DIAGNOSIS — C7951 Secondary malignant neoplasm of bone: Secondary | ICD-10-CM | POA: Insufficient documentation

## 2018-12-12 DIAGNOSIS — Z79899 Other long term (current) drug therapy: Secondary | ICD-10-CM | POA: Diagnosis not present

## 2018-12-12 DIAGNOSIS — C50511 Malignant neoplasm of lower-outer quadrant of right female breast: Secondary | ICD-10-CM | POA: Insufficient documentation

## 2018-12-12 DIAGNOSIS — Z171 Estrogen receptor negative status [ER-]: Secondary | ICD-10-CM

## 2018-12-12 DIAGNOSIS — N6314 Unspecified lump in the right breast, lower inner quadrant: Secondary | ICD-10-CM | POA: Insufficient documentation

## 2018-12-12 MED ORDER — MORPHINE SULFATE ER 15 MG PO TBCR: 15.0000 mg | EXTENDED_RELEASE_TABLET | Freq: Two times a day (BID) | ORAL | 0 refills | Status: DC

## 2018-12-12 NOTE — Telephone Encounter (Signed)
Patient called reporting she has a spot on her breast that she needs Dr Janese Banks to evaluate. Please advise

## 2018-12-12 NOTE — Progress Notes (Signed)
Pt fell when she got dizzy and hurt her right side at rib area and her back-went to ER was checked up and no injuries-just sore. Pt feels that when she takes pain  Pill, compazine and decadron she gets dizzy. Her breast is leaking fluid and it was pus like and now it is red drainage and it burns and stings at the breast site

## 2018-12-12 NOTE — Progress Notes (Signed)
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 After repeated pursuadingefforts She went for a mammogram and ultrasound on 09/18/2018. This showed a highly suspicious mass in the right breast 4.5 cm at the 5:30 position as well as a 9 mm satellite mass along with 5 abnormal lymph nodes. However she did not pursue this with a biopsy until a month later and had a biopsy on 10/25/2018 which showed invasive mammary carcinoma grade 3 and at the time of my visit today her ER PR and HER-2 status is pending. She also noticed that the mass had grown in size and invading through her right chest wall. She is a single mom of 4 kids. She is currently unemployed her parents live close by and help her out.  PET CT scan showed multifocal FDG avid right breast lesions with chest wall involvement along with hypermetabolic right axillary supraclavicular superior mediastinal and bilateral internal mammary nodal metastases.Hypermetabolic bone metastases was identified in the body of the sternum, T12 vertebral as well as lesions in the right iliac bone and a large FDG avid right acetabular lesion without a correlating CT abnormality  Interval history- reports that her breast wound is getting worse and daining more. She sometimes has to change 2-3 shirts  daily. Gauze is not helping. She has continued pain and burning sensation.   ECOG PS- 1 Pain scale- 4 Opioid associated constipation- no  Review of systems- Review of Systems  Constitutional: Positive for malaise/fatigue. Negative for chills, fever and weight loss.  HENT: Negative for congestion, ear discharge and nosebleeds.   Eyes: Negative for blurred vision.  Respiratory: Negative for cough, hemoptysis, sputum production, shortness of breath and wheezing.   Cardiovascular: Negative for chest pain, palpitations, orthopnea and claudication.  Gastrointestinal: Negative for abdominal pain, blood in stool, constipation, diarrhea, heartburn, melena, nausea and vomiting.  Genitourinary: Negative for dysuria, flank pain, frequency, hematuria and urgency.  Musculoskeletal: Negative for back pain, joint pain and myalgias.  Skin: Negative for rash.  Neurological: Negative for dizziness, tingling, focal weakness, seizures, weakness and headaches.  Endo/Heme/Allergies: Does not bruise/bleed easily.  Psychiatric/Behavioral: Negative for depression and suicidal ideas. The patient does not have insomnia.     Pain and discharge from right breast wound  No Known Allergies   Past Medical History:  Diagnosis Date  . Anemia   . Breast cancer [facility])      Past Surgical History:  Procedure Laterality Date  . BREAST BIOPSY Right 10/25/2018   Heart Clip, Pending path  . BREAST BIOPSY Right 10/25/2018   Lymph node biopsy, Hydromarker (butterfly), path pending  . CESAREAN SECTION    . CESAREAN SECTION N/A 04/24/2016   Procedure: REPEAT CESAREAN SECTION WITH BILATERAL TUBALIGATION;  Surgeon: Brayton Mars, MD;  Location: [facility] ORS;  Service: Obstetrics;  Laterality: N/A;  Baby Boy born @ 23 Apgars:8/9 Weight: 8lb 9oz  .  PORTACATH PLACEMENT Left 11/05/2018   Procedure: INSERTION PORT-A-CATH;  Surgeon: Jules Husbands, MD;  Location: [facility] ORS;  Service: General;  Laterality: Left;    Social  History   Socioeconomic History  . Marital status: Single    Spouse name: Not on file  . Number of children: Not on file  . Years of education: Not on file  . Highest education level: Not on file  Occupational History  . Not on file  Social Needs  . Financial resource strain: Not on file  . Food insecurity    Worry: Not on file    Inability: Not on file  . Transportation needs    Medical: Not on file    Non-medical: Not on file  Tobacco Use  . Smoking status: Current Every Day Smoker    Packs/day: 0.25    Years: 5.00    Pack years: 1.25    Types: Cigarettes  . Smokeless tobacco: Never Used  Substance and Sexual Activity  . Alcohol use: No  . Drug use: No  . Sexual activity: Yes  Lifestyle  . Physical activity    Days per week: Not on file    Minutes per session: Not on file  . Stress: Not on file  Relationships  . Social Herbalist on phone: Not on file    Gets together: Not on file    Attends religious service: Not on file    Active member of club or organization: Not on file    Attends meetings of clubs or organizations: Not on file    Relationship status: Not on file  . Intimate partner violence    Fear of current or ex partner: Not on file    Emotionally abused: Not on file    Physically abused: Not on file    Forced sexual activity: Not on file  Other Topics Concern  . Not on file  Social History Narrative  . Not on file    Family History  Problem Relation Age of Onset  . Hypertension Mother   . Stroke Father   . Cancer Maternal Grandmother   . Diabetes Maternal Grandfather   . Heart disease Maternal Grandfather   . Hypertension Maternal Grandfather      Current Outpatient Medications:  .  dexamethasone (DECADRON) 4 MG tablet, Take 2 tablets by mouth once a day on the day after chemotherapy and then take 2 tablets two times a day for 2 days. Take with food., Disp: 30 tablet, Rfl: 1 .  lidocaine-prilocaine (EMLA) cream, Apply 1  application topically as directed. Apply small amount over port site 1 hour before treatments, cover the cream with saran wrap to protect your clothing, Disp: 30 g, Rfl: 0 .  LORazepam (ATIVAN) 0.5 MG tablet, Take 1 tablet (0.5 mg total) by mouth every 6 (six) hours as needed (Nausea or vomiting)., Disp: 30 tablet, Rfl: 0 .  OLANZapine (ZYPREXA) 10 MG tablet, Take 1 tablet (10 mg total) by mouth at bedtime. Take a tablet day 2 through day 5 after each chemo treatment, Disp: 30 tablet, Rfl: 0 .  ondansetron (ZOFRAN) 8 MG tablet, Take 1 tablet (8 mg total) by mouth 2 (two) times daily as needed. Start on the third day after chemotherapy., Disp: 30 tablet, Rfl: 1 .  Oxycodone HCl 10 MG TABS, Take 1 tablet (10 mg total) by mouth every 4 (four) hours as needed., Disp: 120 tablet, Rfl: 0 .  prochlorperazine (COMPAZINE) 10 MG tablet, Take  1 tablet (10 mg total) by mouth every 6 (six) hours as needed (Nausea or vomiting)., Disp: 30 tablet, Rfl: 1 .  gabapentin (NEURONTIN) 300 MG capsule, Take 1 capsule (300 mg total) by mouth 3 (three) times daily. (Patient not taking: Reported on 12/12/2018), Disp: 90 capsule, Rfl: 0 .  morphine (MS CONTIN) 15 MG 12 hr tablet, Take 1 tablet (15 mg total) by mouth every 12 (twelve) hours., Disp: 60 tablet, Rfl: 0  Physical exam:  Vitals:   12/12/18 1413  BP: 100/61  Pulse: 87  Resp: 16  Temp: (!) 97.4 F (36.3 C)  TempSrc: Tympanic  Weight: 111 lb (50.3 kg)   Physical Exam Constitutional:      Comments: Thin and fatigued  HENT:     Head: Normocephalic and atraumatic.  Neck:     Musculoskeletal: Normal range of motion.  Cardiovascular:     Rate and Rhythm: Normal rate and regular rhythm.     Heart sounds: Normal heart sounds.  Pulmonary:     Effort: Pulmonary effort is normal.     Breath sounds: Normal breath sounds.  Abdominal:     General: Bowel sounds are normal.     Palpations: Abdomen is soft.  Skin:    General: Skin is warm and dry.  Neurological:      Mental Status: She is alert and oriented to person, place, and time.    there is an open area of about 4 cm transverse wound over the undersurface of right breast with minimal serosanguinous discharge  CMP Latest Ref Rng & Units 12/03/2018  Glucose 70 - 99 mg/dL 122(H)  BUN 6 - 20 mg/dL 14  Creatinine 0.44 - 1.00 mg/dL 0.68  Sodium 135 - 145 mmol/L 138  Potassium 3.5 - 5.1 mmol/L 3.6  Chloride 98 - 111 mmol/L 103  CO2 22 - 32 mmol/L 23  Calcium 8.9 - 10.3 mg/dL 9.3  Total Protein 6.5 - 8.1 g/dL 7.8  Total Bilirubin 0.3 - 1.2 mg/dL 0.4  Alkaline Phos 38 - 126 U/L 74  AST 15 - 41 U/L 23  ALT 0 - 44 U/L 19   CBC Latest Ref Rng & Units 12/03/2018  WBC 4.0 - 10.5 K/uL 10.5  Hemoglobin 12.0 - 15.0 g/dL 9.2(L)  Hematocrit 36.0 - 46.0 % 29.8(L)  Platelets 150 - 400 K/uL 513(H)    No images are attached to the encounter.  Dg Pelvis 1-2 Views  Result Date: 11/20/2018 CLINICAL DATA:  New breast cancer metastatic disease to right pelvic bones. Evaluate for fracture. EXAM: PELVIS - 1-2 VIEW COMPARISON:  PET-CT 11/04/2018 FINDINGS: Examination demonstrates no evidence of fracture or dislocation. There is a 1.2 cm well-defined sclerotic focus of the right superior pubic ramus without significant uptake on PET-CT as this likely represents a bone island. Remainder the exam is unremarkable. IMPRESSION: No acute fracture. 1.2 cm sclerotic focus over the right superior pubic ramus without uptake on PET-CT. Likely represents a bone island. Electronically Signed   By: Marin Olp M.D.   On: 11/20/2018 10:23   Mr Jeri Cos RC Contrast  Result Date: 11/20/2018 CLINICAL DATA:  New diagnosis of metastatic breast cancer.  Staging. EXAM: MRI HEAD WITHOUT AND WITH CONTRAST TECHNIQUE: Multiplanar, multiecho pulse sequences of the brain and surrounding structures were obtained without and with intravenous contrast. CONTRAST:  50m GADAVIST GADOBUTROL 1 MMOL/ML IV SOLN COMPARISON:  None. FINDINGS: Brain:  Diffusion imaging does not show any acute or subacute infarction or other cause of restricted diffusion.  Brain has normal appearance without evidence of atrophy, infarction, mass lesion, hemorrhage, hydrocephalus or extra-axial collection. After contrast administration, no abnormal enhancement occurs. Vascular: Major vessels at the base of the brain show flow. Skull and upper cervical spine: Negative Sinuses/Orbits: Clear/normal Other: None IMPRESSION: Normal examination.  No evidence of regional metastatic disease. Electronically Signed   By: Nelson Chimes M.D.   On: 11/20/2018 10:34   Dg Femur, Min 2 Views Right  Result Date: 11/20/2018 CLINICAL DATA:  New breast cancer metastases to right iliac bone and right acetabulum as well as pelvis. Evaluate for fracture. EXAM: RIGHT FEMUR 2 VIEWS COMPARISON:  PET-CT 11/04/2018. FINDINGS: Examination demonstrates no significant degenerative change. There is no evidence of fracture or dislocation. Linear 1.2 cm sclerotic focus over the right superior pubic ramus without definite uptake in this region on PET-CT. Remainder the exam is unremarkable. IMPRESSION: No acute fracture. 1.2 cm sclerotic focus over the right superior pubic ramus without uptake in this region on PET-CT as this likely represents bone island. Electronically Signed   By: Marin Olp M.D.   On: 11/20/2018 10:22     Assessment and plan- Patient is a 33 y.o. female with stage IV triple negative right breast cancer stage IV c3 CN3CM1 with bone metastases. She is here for acute visit for worsening right breast wound  Her open wound over undersurface of right breast has more gaping appearance today. She has ongoing pain and drainage from the wound. I discussed her case with DR. Chrsytal. He does not think palliative RT will help and could make wound healing worse. Also spoke to Dr. Dahlia Byes if there would be a role for toilet mastectomy now. She has fairly advanced local disease and has chest wall  involvement as well which would make surgery challenging. He will see her next week and discuss if there are any surgical options.  I will start morphine ER 15 mg BID in the meanwhile in addition to oxycodone prn. Also refer her to wound care.   I will see her in 2 weeks as scheduled for cycle 3 of chemo   Visit Diagnosis 1. Malignant neoplasm of lower-outer quadrant of right breast of female, estrogen receptor negative ([facility])   2. Wound of right breast, initial encounter      Dr. Randa Evens, MD, MPH [facility] [phone] 12/12/2018 2:54 PM

## 2018-12-12 NOTE — Telephone Encounter (Signed)
Patient accepts appointment at 145 today

## 2018-12-12 NOTE — Telephone Encounter (Signed)
I can see her at 1.45 pm today or on monday

## 2018-12-13 ENCOUNTER — Telehealth: Payer: Self-pay | Admitting: *Deleted

## 2018-12-13 NOTE — Telephone Encounter (Signed)
Discussed Dr Elroy Channel response with patient an she will try to stay on [facility] ER for a little longer

## 2018-12-13 NOTE — Telephone Encounter (Signed)
That's the smallest dose of long acting pain medication we can give. Fentanyl patch and oxycontin are all the same equivalent dose. Usually body adjusts to somnolence in a few days time. If she does want to take it, she can increase her short acting oxycodone to 15 mg

## 2018-12-13 NOTE — Telephone Encounter (Signed)
Patient states the medicine she was given yesterday is too strong and would like it changed to something else, She slept frrm the time she got home yesterday until just a bit ago. Please advise

## 2018-12-16 ENCOUNTER — Encounter: Payer: Medicaid Other | Attending: Physician Assistant | Admitting: Physician Assistant

## 2018-12-16 ENCOUNTER — Other Ambulatory Visit: Payer: Self-pay

## 2018-12-16 DIAGNOSIS — Z87891 Personal history of nicotine dependence: Secondary | ICD-10-CM | POA: Insufficient documentation

## 2018-12-16 DIAGNOSIS — C50911 Malignant neoplasm of unspecified site of right female breast: Secondary | ICD-10-CM | POA: Diagnosis not present

## 2018-12-16 DIAGNOSIS — Z8249 Family history of ischemic heart disease and other diseases of the circulatory system: Secondary | ICD-10-CM | POA: Insufficient documentation

## 2018-12-16 DIAGNOSIS — L98499 Non-pressure chronic ulcer of skin of other sites with unspecified severity: Secondary | ICD-10-CM | POA: Diagnosis present

## 2018-12-16 DIAGNOSIS — Z9221 Personal history of antineoplastic chemotherapy: Secondary | ICD-10-CM | POA: Insufficient documentation

## 2018-12-16 DIAGNOSIS — Z923 Personal history of irradiation: Secondary | ICD-10-CM | POA: Insufficient documentation

## 2018-12-17 NOTE — Progress Notes (Signed)
BOBETTE, BERTKE ([id number]) Visit Report for 12/16/2018 Abuse/Suicide Risk Screen Details Patient Name: SRINIDHI, SETSER Date of Service: 12/16/2018 9:45 AM Medical Record Number: [id number] Patient Account Number: [id number] Date of Birth/Sex: April 26, 1985 (32 y.o. F) Treating RN: Montey Hora Primary Care Keron Neenan: Hendricks Milo Other Clinician: Referring Michial Disney: RAO, Astrid Divine Treating Gloris Shiroma/Extender: STONE III, HOYT Weeks in Treatment: 0 Abuse/Suicide Risk Screen Items Answer ABUSE RISK SCREEN: Has anyone close to you tried to hurt or harm you recentlyo No Do you feel uncomfortable with anyone in your familyo No Has anyone forced you do things that you didnot want to doo No Electronic Signature(s) Signed: 12/16/2018 5:14:47 PM By: Montey Hora Entered By: Montey Hora on 12/16/2018 09:57:31 [place], Tawni Millers ([id number]) -------------------------------------------------------------------------------- Activities of Daily Living Details Patient Name: [name] Date of Service: 12/16/2018 9:45 AM Medical Record Number: [id number] Patient Account Number: [id number] Date of Birth/Sex: 10-Aug-1985 (32 y.o. F) Treating RN: Montey Hora Primary Care Karesa Maultsby: Hendricks Milo Other Clinician: Referring Liat Mayol: RAO, Astrid Divine Treating Coyt Govoni/Extender: STONE III, HOYT Weeks in Treatment: 0 Activities of Daily Living Items Answer Activities of Daily Living (Please select one for each item) Drive Automobile Completely Able Take Medications Completely Able Use Telephone Completely Able Care for Appearance Completely Able Use Toilet Completely Able Bath / Shower Completely Able Dress Self Completely Able Feed Self Completely Able Walk Completely Able Get In / Out Bed Completely Able Housework Completely Able Prepare Meals Completely [place] Completely Able Shop for Self Completely Able Electronic Signature(s) Signed: 12/16/2018 5:14:47 PM  By: Montey Hora Entered By: Montey Hora on 12/16/2018 09:57:51 [place], Tawni Millers ([id number]) -------------------------------------------------------------------------------- Education Screening Details Patient Name: [name] Date of Service: 12/16/2018 9:45 AM Medical Record Number: [id number] Patient Account Number: [id number] Date of Birth/Sex: 10-13-1985 (32 y.o. F) Treating RN: Montey Hora Primary Care Arthuro Canelo: Hendricks Milo Other Clinician: Referring Natali Lavallee: RAO, Astrid Divine Treating Floella Ensz/Extender: Melburn Hake, HOYT Weeks in Treatment: 0 Primary Learner Assessed: Patient Learning Preferences/Education Level/Primary Language Learning Preference: Explanation, Demonstration Highest Education Level: High School Preferred Language: English Cognitive Barrier Language Barrier: No Translator Needed: No Memory Deficit: No Emotional Barrier: No Cultural/Religious Beliefs Affecting Medical Care: No Physical Barrier Impaired Vision: No Impaired Hearing: No Decreased Hand dexterity: No Knowledge/Comprehension Knowledge Level: Medium Comprehension Level: Medium Ability to understand written Medium instructions: Ability to understand verbal Medium instructions: Motivation Anxiety Level: Calm Cooperation: Cooperative Education Importance: Acknowledges Need Interest in Health Problems: Asks Questions Perception: Coherent Willingness to Engage in Self- Medium Management Activities: Readiness to Engage in Self- Medium Management Activities: Electronic Signature(s) Signed: 12/16/2018 5:14:47 PM By: Montey Hora Entered By: Montey Hora on 12/16/2018 09:58:12 [place], Tawni Millers ([id number]) -------------------------------------------------------------------------------- Fall Risk Assessment Details Patient Name: [name] Date of Service: 12/16/2018 9:45 AM Medical Record Number: [id number] Patient Account Number: [id number] Date of  Birth/Sex: 1985/07/04 (32 y.o. F) Treating RN: Montey Hora Primary Care Maevyn Riordan: Hendricks Milo Other Clinician: Referring Ethyn Schetter: RAO, Astrid Divine Treating Camran Keady/Extender: Melburn Hake, HOYT Weeks in Treatment: 0 Fall Risk Assessment Items Have you had 2 or more falls in the last 12 monthso 0 No Have you had any fall that resulted in injury in the last 12 monthso 0 No FALLS RISK SCREEN History of falling - immediate or within 3 months 0 No Secondary diagnosis (Do you have 2 or more medical diagnoseso) 0 No Ambulatory aid None/bed rest/wheelchair/nurse 0 Yes Crutches/cane/walker 0 No Furniture 0 No Intravenous therapy Access/Saline/Heparin Lock 0 No Gait/Transferring Normal/ bed rest/ wheelchair 0 Yes Weak (  short steps with or without shuffle, stooped but able to lift head while 0 No walking, may seek support from furniture) Impaired (short steps with shuffle, may have difficulty arising from chair, head 0 No down, impaired balance) Mental Status Oriented to own ability 0 Yes Electronic Signature(s) Signed: 12/16/2018 5:14:47 PM By: Montey Hora Entered By: Montey Hora on 12/16/2018 09:58:22 [place], Tawni Millers ([id number]) -------------------------------------------------------------------------------- Foot Assessment Details Patient Name: [name] Date of Service: 12/16/2018 9:45 AM Medical Record Number: [id number] Patient Account Number: [id number] Date of Birth/Sex: 01-Jun-1985 (32 y.o. F) Treating RN: Montey Hora Primary Care Anasia Agro: Hendricks Milo Other Clinician: Referring Pegge Cumberledge: RAO, Astrid Divine Treating Trestin Vences/Extender: Melburn Hake, HOYT Weeks in Treatment: 0 Foot Assessment Items Site Locations + = Sensation present, - = Sensation absent, C = Callus, U = Ulcer R = Redness, W = Warmth, M = Maceration, PU = Pre-ulcerative lesion F = Fissure, S = Swelling, D = Dryness Assessment Right: Left: Other Deformity: No No Prior Foot Ulcer: No  No Prior Amputation: No No Charcot Joint: No No Ambulatory Status: Ambulatory Without Help Gait: Steady Electronic Signature(s) Signed: 12/16/2018 5:14:47 PM By: Montey Hora Entered By: Montey Hora on 12/16/2018 09:58:54 [place], Tawni Millers ([id number]) -------------------------------------------------------------------------------- Nutrition Risk Screening Details Patient Name: [name] Date of Service: 12/16/2018 9:45 AM Medical Record Number: [id number] Patient Account Number: [id number] Date of Birth/Sex: 1986/01/15 (32 y.o. F) Treating RN: Montey Hora Primary Care Banesa Tristan: Hendricks Milo Other Clinician: Referring Lataya Varnell: RAO, Astrid Divine Treating Hashim Eichhorst/Extender: STONE III, HOYT Weeks in Treatment: 0 Height (in): 64 Weight (lbs): 111 Body Mass Index (BMI): 19.1 Nutrition Risk Screening Items Score Screening NUTRITION RISK SCREEN: I have an illness or condition that made me change the kind and/or amount of 2 Yes food I eat I eat fewer than two meals per day 0 No I eat few fruits and vegetables, or milk products 0 No I have three or more drinks of beer, liquor or wine almost every day 0 No I have tooth or mouth problems that make it hard for me to eat 0 No I don't always have enough money to buy the food I need 0 No I eat alone most of the time 0 No I take three or more different prescribed or over-the-counter drugs a day 1 Yes Without wanting to, I have lost or gained 10 pounds in the last six months 0 No I am not always physically able to shop, cook and/or feed myself 0 No Nutrition Protocols Good Risk Protocol Provide education on Moderate Risk Protocol 0 nutrition High Risk Proctocol Risk Level: Moderate Risk Score: 3 Electronic Signature(s) Signed: 12/16/2018 5:14:47 PM By: Montey Hora Entered By: Montey Hora on 12/16/2018 09:58:47

## 2018-12-18 ENCOUNTER — Other Ambulatory Visit: Payer: Self-pay

## 2018-12-18 ENCOUNTER — Ambulatory Visit (INDEPENDENT_AMBULATORY_CARE_PROVIDER_SITE_OTHER): Payer: Medicaid Other | Admitting: Surgery

## 2018-12-18 ENCOUNTER — Encounter: Payer: Self-pay | Admitting: Surgery

## 2018-12-18 VITALS — BP 113/73 | HR 77 | Temp 97.9°F | Resp 14 | Ht 64.0 in | Wt 111.4 lb

## 2018-12-18 DIAGNOSIS — C50919 Malignant neoplasm of unspecified site of unspecified female breast: Secondary | ICD-10-CM

## 2018-12-18 NOTE — Progress Notes (Signed)
RESSIE, DIAMOND ([id number]) Visit Report for 12/16/2018 Allergy List Details Patient Name: [name], [name] Date of Service: 12/16/2018 9:45 AM Medical Record Number: [id number] Patient Account Number: [id number] Date of Birth/Sex: November 22, 1985 (33 y.o. F) Treating RN: Montey Hora Primary Care Rayden Scheper: Hendricks Milo Other Clinician: Referring Kadeen Sroka: RAO, Astrid Divine Treating Sheva Mcdougle/Extender: STONE III, HOYT Weeks in Treatment: 0 Allergies Active Allergies No Known Drug Allergies Allergy Notes Electronic Signature(s) Signed: 12/16/2018 5:14:47 PM By: Montey Hora Entered By: Montey Hora on 12/16/2018 09:57:23 [place], [name] ([id number]) -------------------------------------------------------------------------------- Arrival Information Details Patient Name: [name] Date of Service: 12/16/2018 9:45 AM Medical Record Number: [id number] Patient Account Number: [id number] Date of Birth/Sex: 28-Dec-1985 (33 y.o. F) Treating RN: Montey Hora Primary Care Tahani Potier: Hendricks Milo Other Clinician: Referring Shamel Germond: RAO, Astrid Divine Treating Evangelyne Loja/Extender: Melburn Hake, HOYT Weeks in Treatment: 0 Visit Information Patient Arrived: Ambulatory Arrival Time: 09:52 Accompanied By: self Transfer Assistance: None Patient Identification Verified: Yes Secondary Verification Process Completed: Yes Electronic Signature(s) Signed: 12/16/2018 5:14:47 PM By: Montey Hora Entered By: Montey Hora on 12/16/2018 09:53:23 [place], [name] ([id number]) -------------------------------------------------------------------------------- Clinic Level of Care Assessment Details Patient Name: [name] Date of Service: 12/16/2018 9:45 AM Medical Record Number: [id number] Patient Account Number: [id number] Date of Birth/Sex: 06-28-1985 (33 y.o. F) Treating RN: Harold Barban Primary Care Lashawnda Hancox: Hendricks Milo Other Clinician: Referring Leeland Lovelady: RAO,  Astrid Divine Treating Thomasine Klutts/Extender: Melburn Hake, HOYT Weeks in Treatment: 0 Clinic Level of Care Assessment Items TOOL 2 Quantity Score []  - Use when only an EandM is performed on the INITIAL visit 0 ASSESSMENTS - Nursing Assessment / Reassessment X - General Physical Exam (combine w/ comprehensive assessment (listed just below) when 1 20 performed on new pt. evals) X- 1 25 Comprehensive Assessment (HX, ROS, Risk Assessments, Wounds Hx, etc.) ASSESSMENTS - Wound and Skin Assessment / Reassessment X - Simple Wound Assessment / Reassessment - one wound 1 5 []  - 0 Complex Wound Assessment / Reassessment - multiple wounds []  - 0 Dermatologic / Skin Assessment (not related to wound area) ASSESSMENTS - Ostomy and/or Continence Assessment and Care []  - Incontinence Assessment and Management 0 []  - 0 Ostomy Care Assessment and Management (repouching, etc.) PROCESS - Coordination of Care X - Simple Patient / Family Education for ongoing care 1 15 []  - 0 Complex (extensive) Patient / Family Education for ongoing care []  - 0 Staff obtains Programmer, systems, Records, Test Results / Process Orders []  - 0 Staff telephones HHA, Nursing Homes / Clarify orders / etc []  - 0 Routine Transfer to another Facility (non-emergent condition) []  - 0 Routine Hospital Admission (non-emergent condition) []  - 0 New Admissions / Biomedical engineer / Ordering NPWT, Apligraf, etc. []  - 0 Emergency Hospital Admission (emergent condition) X- 1 10 Simple Discharge Coordination []  - 0 Complex (extensive) Discharge Coordination PROCESS - Special Needs []  - Pediatric / Minor Patient Management 0 []  - 0 Isolation Patient Management [name], Judy N. ([id number]) []  - 0 Hearing / Language / Visual special needs []  - 0 Assessment of Community assistance (transportation, D/C planning, etc.) []  - 0 Additional assistance / Altered mentation []  - 0 Support Surface(s) Assessment (bed, cushion, seat,  etc.) INTERVENTIONS - Wound Cleansing / Measurement X - Wound Imaging (photographs - any number of wounds) 1 5 []  - 0 Wound Tracing (instead of photographs) X- 1 5 Simple Wound Measurement - one wound []  - 0 Complex Wound Measurement - multiple wounds X- 1 5 Simple Wound Cleansing - one wound []  - 0 Complex  Wound Cleansing - multiple wounds INTERVENTIONS - Wound Dressings X - Small Wound Dressing one or multiple wounds 1 10 []  - 0 Medium Wound Dressing one or multiple wounds []  - 0 Large Wound Dressing one or multiple wounds []  - 0 Application of Medications - injection INTERVENTIONS - Miscellaneous []  - External ear exam 0 []  - 0 Specimen Collection (cultures, biopsies, blood, body fluids, etc.) []  - 0 Specimen(s) / Culture(s) sent or taken to Lab for analysis []  - 0 Patient Transfer (multiple staff / Harrel Lemon Lift / Similar devices) []  - 0 Simple Staple / Suture removal (25 or less) []  - 0 Complex Staple / Suture removal (26 or more) []  - 0 Hypo / Hyperglycemic Management (close monitor of Blood Glucose) []  - 0 Ankle / Brachial Index (ABI) - do not check if billed separately Has the patient been seen at the hospital within the last three years: Yes Total Score: 100 Level Of Care: New/Established - Level 3 Electronic Signature(s) Signed: 12/18/2018 4:37:37 PM By: Harold Barban Entered By: Harold Barban on 12/16/2018 10:25:50 [name], [name] ([id number]) -------------------------------------------------------------------------------- Encounter Discharge Information Details Patient Name: [name] Date of Service: 12/16/2018 9:45 AM Medical Record Number: [id number] Patient Account Number: [id number] Date of Birth/Sex: 07/19/1985 (33 y.o. F) Treating RN: Harold Barban Primary Care Jalil Lorusso: Hendricks Milo Other Clinician: Referring Raisa Ditto: RAO, Astrid Divine Treating Lexus Shampine/Extender: Melburn Hake, HOYT Weeks in Treatment: 0 Encounter Discharge  Information Items Discharge Condition: Stable Ambulatory Status: Ambulatory Discharge Destination: Home Transportation: Private Auto Accompanied By: self Schedule Follow-up Appointment: Yes Clinical Summary of Care: Electronic Signature(s) Signed: 12/18/2018 4:37:37 PM By: Harold Barban Entered By: Harold Barban on 12/16/2018 10:35:31 [place], [name] ([id number]) -------------------------------------------------------------------------------- Lower Extremity Assessment Details Patient Name: [name] Date of Service: 12/16/2018 9:45 AM Medical Record Number: [id number] Patient Account Number: [id number] Date of Birth/Sex: 10-17-1985 (33 y.o. F) Treating RN: Montey Hora Primary Care Aritza Brunet: Hendricks Milo Other Clinician: Referring Zebadiah Willert: RAO, Astrid Divine Treating Kristeen Lantz/Extender: Melburn Hake, HOYT Weeks in Treatment: 0 Electronic Signature(s) Signed: 12/16/2018 5:14:47 PM By: Montey Hora Entered By: Montey Hora on 12/16/2018 09:57:13 [place], [name] ([id number]) -------------------------------------------------------------------------------- Multi Wound Chart Details Patient Name: [name] Date of Service: 12/16/2018 9:45 AM Medical Record Number: [id number] Patient Account Number: [id number] Date of Birth/Sex: 1985-11-29 (33 y.o. F) Treating RN: Harold Barban Primary Care Lianni Kanaan: Hendricks Milo Other Clinician: Referring Ben Sanz: RAO, Astrid Divine Treating Pericles Carmicheal/Extender: STONE III, HOYT Weeks in Treatment: 0 Vital Signs Height(in): 64 Pulse(bpm): 69 Weight(lbs): 111 Blood Pressure(mmHg): 104/48 Body Mass Index(BMI): 19 Temperature(F): 98.8 Respiratory Rate 16 (breaths/min): Photos: [N/A:N/A] Wound Location: Right Breast N/A N/A Wounding Event: Gradually Appeared N/A N/A Primary Etiology: Malignant Wound N/A N/A Comorbid History: Received Chemotherapy, N/A N/A Received Radiation Date Acquired: 11/18/2018 N/A  N/A Weeks of Treatment: 0 N/A N/A Wound Status: Open N/A N/A Measurements L x W x D 3.2x1.6x2 N/A N/A (cm) Area (cm) : 4.021 N/A N/A Volume (cm) : 8.042 N/A N/A Classification: Full Thickness Without N/A N/A Exposed Support Structures Exudate Amount: Large N/A N/A Exudate Type: Serous N/A N/A Exudate Color: amber N/A N/A Foul Odor After Cleansing: Yes N/A N/A Odor Anticipated Due to No N/A N/A Product Use: Wound Margin: Flat and Intact N/A N/A Granulation Amount: Medium (34-66%) N/A N/A Granulation Quality: Pink N/A N/A Necrotic Amount: Medium (34-66%) N/A N/A Exposed Structures: Fat Layer (Subcutaneous N/A N/A Tissue) Exposed: Yes Fascia: No Tendon: No Muscle: No [name], Judy N. ([id number]) Joint: No Bone: No Epithelialization: None N/A N/A  Treatment Notes Electronic Signature(s) Signed: 12/18/2018 4:37:37 PM By: Harold Barban Entered By: Harold Barban on 12/16/2018 10:23:45 [name] ([id number]) -------------------------------------------------------------------------------- [place] Details Patient Name: [name] Date of Service: 12/16/2018 9:45 AM Medical Record Number: [id number] Patient Account Number: [id number] Date of Birth/Sex: 10-07-1985 (33 y.o. F) Treating RN: Harold Barban Primary Care Deaken Jurgens: Hendricks Milo Other Clinician: Referring Katonya Blecher: RAO, Astrid Divine Treating Tomeika Weinmann/Extender: Melburn Hake, HOYT Weeks in Treatment: 0 Active Inactive Wound/Skin Impairment Nursing Diagnoses: Impaired tissue integrity Goals: Ulcer/skin breakdown will have a volume reduction of 30% by week 4 Date Initiated: 12/16/2018 Target Resolution Date: 01/16/2019 Goal Status: Active Interventions: Assess patient/caregiver ability to obtain necessary supplies Assess patient/caregiver ability to perform ulcer/skin care regimen upon admission and as needed Assess ulceration(s) every visit Notes: Electronic  Signature(s) Signed: 12/18/2018 4:37:37 PM By: Harold Barban Entered By: Harold Barban on 12/16/2018 10:23:03 [place], [name] ([id number]) -------------------------------------------------------------------------------- Pain Assessment Details Patient Name: [name] Date of Service: 12/16/2018 9:45 AM Medical Record Number: [id number] Patient Account Number: [id number] Date of Birth/Sex: Jul 06, 1985 (33 y.o. F) Treating RN: Montey Hora Primary Care Mark Benecke: Hendricks Milo Other Clinician: Referring Conny Moening: RAO, Astrid Divine Treating Levoy Geisen/Extender: STONE III, HOYT Weeks in Treatment: 0 Active Problems Location of Pain Severity and Description of Pain Patient Has Paino Yes Site Locations Pain Location: Pain in Ulcers With Dressing Change: Yes Duration of the Pain. Constant / Intermittento Constant Pain Management and Medication Current Pain Management: Electronic Signature(s) Signed: 12/16/2018 5:14:47 PM By: Montey Hora Entered By: Montey Hora on 12/16/2018 09:53:39 [name], [name] ([id number]) -------------------------------------------------------------------------------- Patient/Caregiver Education Details Patient Name: [name] Date of Service: 12/16/2018 9:45 AM Medical Record Number: [id number] Patient Account Number: [id number] Date of Birth/Gender: 08-15-1985 (33 y.o. F) Treating RN: Harold Barban Primary Care Physician: Hendricks Milo Other Clinician: Referring Physician: RAOAstrid Divine Treating Physician/Extender: Sharalyn Ink in Treatment: 0 Education Assessment Education Provided To: Patient Education Topics Provided Wound/Skin Impairment: Handouts: Caring for Your Ulcer Methods: Demonstration, Explain/Verbal Responses: State content correctly Electronic Signature(s) Signed: 12/18/2018 4:37:37 PM By: Harold Barban Entered By: Harold Barban on 12/16/2018 10:24:36 [name], [name]  ([id number]) -------------------------------------------------------------------------------- Wound Assessment Details Patient Name: [name] Date of Service: 12/16/2018 9:45 AM Medical Record Number: [id number] Patient Account Number: [id number] Date of Birth/Sex: [birth year] (33 y.o. F) Treating RN: Montey Hora Primary Care Youssef Footman: Hendricks Milo Other Clinician: Referring Derrill Bagnell: RAO, Astrid Divine Treating Elesia Pemberton/Extender: STONE III, HOYT Weeks in Treatment: 0 Wound Status Wound Number: 1 Primary Etiology: Malignant Wound Wound Location: Right Breast Wound Status: Open Wounding Event: Gradually Appeared Comorbid Received Chemotherapy, Received History: Radiation Date Acquired: 11/18/2018 Weeks Of Treatment: 0 Clustered Wound: No Photos Wound Measurements Length: (cm) 3.2 Width: (cm) 1.6 Depth: (cm) 2 Area: (cm) 4.021 Volume: (cm) 8.042 % Reduction in Area: 0% % Reduction in Volume: 0% Epithelialization: None Tunneling: No Undermining: No Wound Description Full Thickness Without Exposed Support Foul Odo Classification: Structures Due to P Wound Margin: Flat and Intact Slough/F Exudate Large Amount: Exudate Type: Serous Exudate Color: amber r After Cleansing: Yes roduct Use: No ibrino Yes Wound Bed Granulation Amount: Medium (34-66%) Exposed Structure Granulation Quality: Pink Fascia Exposed: No Necrotic Amount: Medium (34-66%) Fat Layer (Subcutaneous Tissue) Exposed: Yes Necrotic Quality: Adherent Slough Tendon Exposed: No Muscle Exposed: No Joint Exposed: No Bone Exposed: No [name], Judy N. ([id number]) Treatment Notes Wound #1 (Right Breast) Notes CarboFlex, ABD, Tape to secure Electronic Signature(s) Signed: 12/16/2018 5:14:47 PM By: Montey Hora Signed: 12/18/2018 4:37:37 PM By:  Harold Barban Entered By: Harold Barban on 12/16/2018 10:32:58 [name], [name]  ([id number]) -------------------------------------------------------------------------------- Vitals Details Patient Name: [name] Date of Service: 12/16/2018 9:45 AM Medical Record Number: [id number] Patient Account Number: [id number] Date of Birth/Sex: November 21, 1985 (33 y.o. F) Treating RN: Montey Hora Primary Care Kaytelynn Scripter: Hendricks Milo Other Clinician: Referring Anoushka Divito: RAO, Astrid Divine Treating Judieth Mckown/Extender: STONE III, HOYT Weeks in Treatment: 0 Vital Signs Time Taken: 09:53 Temperature (F): 98.8 Height (in): 64 Pulse (bpm): 69 Source: Measured Respiratory Rate (breaths/min): 16 Weight (lbs): 111 Blood Pressure (mmHg): 104/48 Source: Measured Reference Range: 80 - 120 mg / dl Body Mass Index (BMI): 19.1 Electronic Signature(s) Signed: 12/16/2018 5:14:47 PM By: Montey Hora Entered By: Montey Hora on 12/16/2018 09:57:04

## 2018-12-18 NOTE — Progress Notes (Signed)
AARIS, CASTETTER ([id number]) Visit Report for 12/16/2018 Chief Complaint Document Details Patient Name: [name], [name] Date of Service: 12/16/2018 9:45 AM Medical Record Number: [id number] Patient Account Number: [id number] Date of Birth/Sex: 19-Dec-1985 (32 y.o. F) Treating RN: Harold Barban Primary Care Provider: Hendricks Milo Other Clinician: Referring Provider: RAO, Astrid Divine Treating Provider/Extender: Melburn Hake, Rylyn Ranganathan Weeks in Treatment: 0 Information Obtained from: Patient Chief Complaint Right breast cancerous ulcer Electronic Signature(s) Signed: 12/16/2018 10:20:15 AM By: Worthy Keeler PA-C Entered By: Worthy Keeler on 12/16/2018 10:20:14 [name] ([id number]) -------------------------------------------------------------------------------- HPI Details Patient Name: [name] Date of Service: 12/16/2018 9:45 AM Medical Record Number: [id number] Patient Account Number: [id number] Date of Birth/Sex: 1986/01/08 (32 y.o. F) Treating RN: Harold Barban Primary Care Provider: Hendricks Milo Other Clinician: Referring Provider: RAO, Astrid Divine Treating Provider/Extender: Melburn Hake, Morty Ortwein Weeks in Treatment: 0 History of Present Illness HPI Description: 12/16/2018 upon evaluation today patient presents for initial evaluation here in our clinic concerning issues that she has been having with a wound on her right breast. This is actually an initial biopsy site she is known she has had breast cancer for 2 months she has been undergoing chemotherapy and radiation. Subsequently the biopsy that they took initially never healed and this is developed as the cancer seems to be breaking down into a larger wound which is draining a lot and she notes that she does have a lot of discharge as well as odor at this point. She also is going to be seeing Dr. Caroleen Hamman on Wednesday which is just 2 days away for discussion of mastectomy and surgical management of her  right breast with regard to the cancer. This appointment was made by her oncologist. She is still actively undergoing chemotherapy although she has completed radiation based on what she tells me. Obviously the radiation is probably why the wound has gotten larger as it is again destroying the cancerous tissue which in turn is making a larger wound at this point. The patient states she is not having a terrible amount of pain which is good news she is typically very active she has 4 children and subsequently is very busy with them as well. She states that she has been able to keep an upbeat attitude as her mother is praying for her and she knows that prayers work. Fortunately overall she is a fairly healthy individual otherwise which is also excellent news. Electronic Signature(s) Signed: 12/16/2018 2:42:09 PM By: Worthy Keeler PA-C Entered By: Worthy Keeler on 12/16/2018 14:42:08 [name] ([id number]) -------------------------------------------------------------------------------- Physical Exam Details Patient Name: [name] Date of Service: 12/16/2018 9:45 AM Medical Record Number: [id number] Patient Account Number: [id number] Date of Birth/Sex: September 11, 1985 (32 y.o. F) Treating RN: Harold Barban Primary Care Provider: Hendricks Milo Other Clinician: Referring Provider: RAO, Astrid Divine Treating Provider/Extender: STONE III, Vivek Grealish Weeks in Treatment: 0 Constitutional sitting or standing blood pressure is within target range for patient.. pulse regular and within target range for patient.Marland Kitchen respirations regular, non-labored and within target range for patient.Marland Kitchen temperature within target range for patient.. Well- nourished and well-hydrated in no acute distress. Eyes conjunctiva clear no eyelid edema noted. pupils equal round and reactive to light and accommodation. Ears, Nose, Mouth, and Throat no gross abnormality of ear auricles or external auditory canals. normal  hearing noted during conversation. mucus membranes moist. Respiratory normal breathing without difficulty. clear to auscultation bilaterally. Cardiovascular regular rate and rhythm with normal S1, S2. no clubbing, cyanosis, significant edema, <3  sec cap refill. Gastrointestinal (GI) soft, non-tender, non-distended, +BS. no ventral hernia noted. Musculoskeletal normal gait and posture. no significant deformity or arthritic changes, no loss or range of motion, no clubbing. Psychiatric this patient is able to make decisions and demonstrates good insight into disease process. Alert and Oriented x 3. pleasant and cooperative. Notes Upon inspection today patient did have a significantly deep wound on the underside of her right breast which does show evidence of necrotic tissue as well as drainage at this point. There was not a significantly strong odor noted although the patient states she is definitely noted odor more frequently especially since the radiation and chemotherapy began. She definitely feels like that is working and her doctors likewise feel like things are doing well in that regard. However she has been recommended to a surgeon for consideration of mastectomy which hopefully will get rid of the cancerous tissue at that point. We discussed the possibility that she may require wound VAC following. Electronic Signature(s) Signed: 12/16/2018 2:42:59 PM By: Worthy Keeler PA-C Entered By: Worthy Keeler on 12/16/2018 14:42:59 [name] ([id number]) -------------------------------------------------------------------------------- Physician Orders Details Patient Name: [name] Date of Service: 12/16/2018 9:45 AM Medical Record Number: [id number] Patient Account Number: [id number] Date of Birth/Sex: 1985/04/29 (32 y.o. F) Treating RN: Harold Barban Primary Care Provider: Hendricks Milo Other Clinician: Referring Provider: RAO, Astrid Divine Treating  Provider/Extender: Melburn Hake, Quinn Quam Weeks in Treatment: 0 Verbal / Phone Orders: No Diagnosis Coding ICD-10 Coding Code Description S21.001A Unspecified open wound of right breast, initial encounter C50.911 Malignant neoplasm of unspecified site of right female breast Wound Cleansing Wound #1 Right Breast o Cleanse wound with mild soap and water Primary Wound Dressing Wound #1 Right Breast o Other: - CarboFlex Secondary Dressing Wound #1 Right Breast o ABD pad - To absorb drainage, Tape to secure Dressing Change Frequency Wound #1 Right Breast o Change dressing every day. Follow-up Appointments Wound #1 Right Breast o Return Appointment in 1 week. Electronic Signature(s) Signed: 12/16/2018 5:28:28 PM By: Worthy Keeler PA-C Signed: 12/18/2018 4:37:37 PM By: Harold Barban Entered By: Harold Barban on 12/16/2018 10:32:15 [place], [name] ([id number]) -------------------------------------------------------------------------------- Problem List Details Patient Name: [name] Date of Service: 12/16/2018 9:45 AM Medical Record Number: [id number] Patient Account Number: [id number] Date of Birth/Sex: 1985/10/22 (32 y.o. F) Treating RN: Harold Barban Primary Care Provider: Hendricks Milo Other Clinician: Referring Provider: RAO, Astrid Divine Treating Provider/Extender: Melburn Hake, Jazminn Pomales Weeks in Treatment: 0 Active Problems ICD-10 Evaluated Encounter Code Description Active Date Today Diagnosis S21.001A Unspecified open wound of right breast, initial encounter 12/16/2018 No Yes C50.911 Malignant neoplasm of unspecified site of right female breast 12/16/2018 No Yes Inactive Problems Resolved Problems Electronic Signature(s) Signed: 12/16/2018 10:19:47 AM By: Worthy Keeler PA-C Entered By: Worthy Keeler on 12/16/2018 10:19:46 [place], [name] ([id number]) -------------------------------------------------------------------------------- Progress  Note Details Patient Name: [name] Date of Service: 12/16/2018 9:45 AM Medical Record Number: [id number] Patient Account Number: [id number] Date of Birth/Sex: 1985-07-26 (32 y.o. F) Treating RN: Harold Barban Primary Care Provider: Hendricks Milo Other Clinician: Referring Provider: RAO, Astrid Divine Treating Provider/Extender: Melburn Hake, Rashay Barnette Weeks in Treatment: 0 Subjective Chief Complaint Information obtained from Patient Right breast cancerous ulcer History of Present Illness (HPI) 12/16/2018 upon evaluation today patient presents for initial evaluation here in our clinic concerning issues that she has been having with a wound on her right breast. This is actually an initial biopsy site she is known she has had breast cancer  for 2 months she has been undergoing chemotherapy and radiation. Subsequently the biopsy that they took initially never healed and this is developed as the cancer seems to be breaking down into a larger wound which is draining a lot and she notes that she does have a lot of discharge as well as odor at this point. She also is going to be seeing Dr. Caroleen Hamman on Wednesday which is just 2 days away for discussion of mastectomy and surgical management of her right breast with regard to the cancer. This appointment was made by her oncologist. She is still actively undergoing chemotherapy although she has completed radiation based on what she tells me. Obviously the radiation is probably why the wound has gotten larger as it is again destroying the cancerous tissue which in turn is making a larger wound at this point. The patient states she is not having a terrible amount of pain which is good news she is typically very active she has 4 children and subsequently is very busy with them as well. She states that she has been able to keep an upbeat attitude as her mother is praying for her and she knows that prayers work. Fortunately overall she is a fairly healthy  individual otherwise which is also excellent news. Patient History Information obtained from Patient. Allergies No Known Drug Allergies Family History Hypertension - Mother,Father, No family history of Cancer, Diabetes, Heart Disease, Hereditary Spherocytosis, Kidney Disease, Lung Disease, Seizures, Stroke, Thyroid Problems, Tuberculosis. Social History Former smoker, Marital Status - Single, Alcohol Use - Never, Drug Use - No History, Caffeine Use - Daily. Medical History Integumentary (Skin) Denies history of History of Burn, History of pressure wounds Oncologic Patient has history of Received Chemotherapy, Received Radiation Medical And Surgical History Notes Oncologic breast cancer - current Review of Systems (ROS) Constitutional Symptoms ([place]) [place], [name]. ([id number]) Denies complaints or symptoms of Fatigue, Fever, Chills, Marked Weight Change. Eyes Denies complaints or symptoms of Dry Eyes, Vision Changes, Glasses / Contacts. Ear/Nose/Mouth/Throat Denies complaints or symptoms of Difficult clearing ears, Sinusitis. Hematologic/Lymphatic Denies complaints or symptoms of Bleeding / Clotting Disorders, Human Immunodeficiency Virus. Respiratory Denies complaints or symptoms of Chronic or frequent coughs, Shortness of Breath. Cardiovascular Denies complaints or symptoms of Chest pain, LE edema. Gastrointestinal Denies complaints or symptoms of Frequent diarrhea, Nausea, Vomiting. Endocrine Denies complaints or symptoms of Hepatitis, Thyroid disease, Polydypsia (Excessive Thirst). Genitourinary Denies complaints or symptoms of Kidney failure/ Dialysis, Incontinence/dribbling. Immunological Denies complaints or symptoms of Hives, Itching. Integumentary (Skin) Complains or has symptoms of Wounds. Denies complaints or symptoms of Bleeding or bruising tendency, Breakdown, Swelling. Musculoskeletal Denies complaints or symptoms of Muscle Pain, Muscle  Weakness. Neurologic Denies complaints or symptoms of Numbness/parasthesias, Focal/Weakness. Psychiatric Denies complaints or symptoms of Anxiety, Claustrophobia. General Notes: patient has a port a cath Objective Constitutional sitting or standing blood pressure is within target range for patient.. pulse regular and within target range for patient.Marland Kitchen respirations regular, non-labored and within target range for patient.Marland Kitchen temperature within target range for patient.. Well- nourished and well-hydrated in no acute distress. Vitals Time Taken: 9:53 AM, Height: 64 in, Source: Measured, Weight: 111 lbs, Source: Measured, BMI: 19.1, Temperature: 98.8 F, Pulse: 69 bpm, Respiratory Rate: 16 breaths/min, Blood Pressure: 104/48 mmHg. Eyes conjunctiva clear no eyelid edema noted. pupils equal round and reactive to light and accommodation. Ears, Nose, Mouth, and Throat no gross abnormality of ear auricles or external auditory canals. normal hearing noted during conversation. mucus membranes moist. Respiratory  normal breathing without difficulty. clear to auscultation bilaterally. [place], [name]. ([id number]) Cardiovascular regular rate and rhythm with normal S1, S2. no clubbing, cyanosis, significant edema, Gastrointestinal (GI) soft, non-tender, non-distended, +BS. no ventral hernia noted. Musculoskeletal normal gait and posture. no significant deformity or arthritic changes, no loss or range of motion, no clubbing. Psychiatric this patient is able to make decisions and demonstrates good insight into disease process. Alert and Oriented x 3. pleasant and cooperative. General Notes: Upon inspection today patient did have a significantly deep wound on the underside of her right breast which does show evidence of necrotic tissue as well as drainage at this point. There was not a significantly strong odor noted although the patient states she is definitely noted odor more frequently especially  since the radiation and chemotherapy began. She definitely feels like that is working and her doctors likewise feel like things are doing well in that regard. However she has been recommended to a surgeon for consideration of mastectomy which hopefully will get rid of the cancerous tissue at that point. We discussed the possibility that she may require wound VAC following. Integumentary (Hair, Skin) Wound #1 status is Open. Original cause of wound was Gradually Appeared. The wound is located on the Right Breast. The wound measures 3.2cm length x 1.6cm width x 2cm depth; 4.021cm^2 area and 8.042cm^3 volume. There is Fat Layer (Subcutaneous Tissue) Exposed exposed. There is no tunneling or undermining noted. There is a large amount of serous drainage noted. Foul odor after cleansing was noted. The wound margin is flat and intact. There is medium (34-66%) pink granulation within the wound bed. There is a medium (34-66%) amount of necrotic tissue within the wound bed including Adherent Slough. Assessment Active Problems ICD-10 Unspecified open wound of right breast, initial encounter Malignant neoplasm of unspecified site of right female breast Plan Wound Cleansing: Wound #1 Right Breast: Cleanse wound with mild soap and water Primary Wound Dressing: Wound #1 Right Breast: Other: - CarboFlex Secondary Dressing: Wound #1 Right Breast: ABD pad - To absorb drainage, Tape to secure Dressing Change Frequency: Wound #1 Right Breast: Change dressing every day. [name], Judy NMarland Kitchen ([id number]) Follow-up Appointments: Wound #1 Right Breast: Return Appointment in 1 week. 1. I discussed with the patient that I do believe we can help her out as far as ordering some supplies to try and hopefully manage the drainage and odor. She is very appreciative of that today. 2. I am also going to suggest that she follow-up obviously with the surgeon on Wednesday to see what they have to say I explained that  they could be discussing a skin flap versus potentially doing a wound VAC following surgery again I am not sure which direction they will be going but these are definitely questions to discuss with the surgeon when she sees him Wednesday. 3. With regard to her chemotherapy and radiation she is done with radiation but still continuing with the chemotherapy at this time obviously I think she should continue with her oncologist what ever they recommend at this point. We will see patient back for reevaluation in 1 week here in the clinic. If anything worsens or changes patient will contact our office for additional recommendations. I am get a plan to see the patient back for reevaluation in 1 week's time again just to ensure that things are doing okay although honestly if she has surgery prior to then she will not even need that appointment. I think a surgical option is probably her  best option at this time from a healing perspective as opposed to just wound care alone. Electronic Signature(s) Signed: 12/16/2018 2:44:27 PM By: Worthy Keeler PA-C Entered By: Worthy Keeler on 12/16/2018 14:44:27 [place], [name] ([id number]) -------------------------------------------------------------------------------- ROS/PFSH Details Patient Name: [name] Date of Service: 12/16/2018 9:45 AM Medical Record Number: [id number] Patient Account Number: [id number] Date of Birth/Sex: Feb 09, 1986 (32 y.o. F) Treating RN: Montey Hora Primary Care Provider: Hendricks Milo Other Clinician: Referring Provider: RAO, Astrid Divine Treating Provider/Extender: STONE III, Jaeleigh Monaco Weeks in Treatment: 0 Information Obtained From Patient Constitutional Symptoms (General Health) Complaints and Symptoms: Negative for: Fatigue; Fever; Chills; Marked Weight Change Eyes Complaints and Symptoms: Negative for: Dry Eyes; Vision Changes; Glasses / Contacts Ear/Nose/Mouth/Throat Complaints and Symptoms: Negative for:  Difficult clearing ears; Sinusitis Hematologic/Lymphatic Complaints and Symptoms: Negative for: Bleeding / Clotting Disorders; Human Immunodeficiency Virus Respiratory Complaints and Symptoms: Negative for: Chronic or frequent coughs; Shortness of Breath Cardiovascular Complaints and Symptoms: Negative for: Chest pain; LE edema Gastrointestinal Complaints and Symptoms: Negative for: Frequent diarrhea; Nausea; Vomiting Endocrine Complaints and Symptoms: Negative for: Hepatitis; Thyroid disease; Polydypsia (Excessive Thirst) Genitourinary Complaints and Symptoms: Negative for: Kidney failure/ Dialysis; Incontinence/dribbling Immunological [place], [name]. ([id number]) Complaints and Symptoms: Negative for: Hives; Itching Integumentary (Skin) Complaints and Symptoms: Positive for: Wounds Negative for: Bleeding or bruising tendency; Breakdown; Swelling Medical History: Negative for: History of Burn; History of pressure wounds Musculoskeletal Complaints and Symptoms: Negative for: Muscle Pain; Muscle Weakness Neurologic Complaints and Symptoms: Negative for: Numbness/parasthesias; Focal/Weakness Psychiatric Complaints and Symptoms: Negative for: Anxiety; Claustrophobia Oncologic Medical History: Positive for: Received Chemotherapy; Received Radiation Past Medical History Notes: breast cancer - current Immunizations Pneumococcal Vaccine: Received Pneumococcal Vaccination: No Implantable Devices None Family and Social History Cancer: No; Diabetes: No; Heart Disease: No; Hereditary Spherocytosis: No; Hypertension: Yes - Mother,Father; Kidney Disease: No; Lung Disease: No; Seizures: No; Stroke: No; Thyroid Problems: No; Tuberculosis: No; Former smoker; Marital Status - Single; Alcohol Use: Never; Drug Use: No History; Caffeine Use: Daily; Financial Concerns: No; Food, Clothing or Shelter Needs: No; Support System Lacking: No; Transportation Concerns: No Notes patient  has a port a cath Electronic Signature(s) Signed: 12/16/2018 5:14:47 PM By: Montey Hora Signed: 12/16/2018 5:28:28 PM By: Worthy Keeler PA-C Entered By: Montey Hora on 12/16/2018 10:00:51 [place], [name] ([id number]) -------------------------------------------------------------------------------- SuperBill Details Patient Name: [name] Date of Service: 12/16/2018 Medical Record Number: [id number] Patient Account Number: [id number] Date of Birth/Sex: [birth year] (32 y.o. F) Treating RN: Harold Barban Primary Care Provider: Hendricks Milo Other Clinician: Referring Provider: RAO, Astrid Divine Treating Provider/Extender: Melburn Hake, Annely Sliva Weeks in Treatment: 0 Diagnosis Coding ICD-10 Codes Code Description [id number] Unspecified open wound of right breast, initial encounter C50.911 Malignant neoplasm of unspecified site of right female breast Facility Procedures CPT4 Code: [id number] Description: [place] VISIT-LEV 3 EST PT Modifier: Quantity: 1 Physician Procedures CPT4 Code: [id number] Description: 99214 - WC PHYS LEVEL 4 - EST PT ICD-10 Diagnosis Description S21.001A Unspecified open wound of right breast, initial encounter C50.911 Malignant neoplasm of unspecified site of right female br Modifier: [place]: 1 Electronic Signature(s) Signed: 12/16/2018 2:45:01 PM By: Worthy Keeler PA-C Entered By: Worthy Keeler on 12/16/2018 14:45:00

## 2018-12-18 NOTE — Patient Instructions (Signed)
We will speak with our plastic surgeon regarding surgery for you. If she does not think this surgery is feasible we will get you a referral to [facility] or [facility] for a second opinion.

## 2018-12-19 ENCOUNTER — Encounter: Payer: Self-pay | Admitting: Surgery

## 2018-12-19 ENCOUNTER — Telehealth: Payer: Self-pay

## 2018-12-19 NOTE — Telephone Encounter (Signed)
Patient notified of 12/24/2018 @ 2:[address] appointment [address].Dillingham . Address provided.

## 2018-12-19 NOTE — Progress Notes (Signed)
Outpatient Surgical Follow Up  12/19/2018  [name] is an 33 y.o. female.   Chief Complaint  Patient presents with  . Routine Post Op    HPI: [name] is a 33 y.o. 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 c/o of mild moderate constant pain rigth breast.  I did place a port and she is doing well from that she started chemotherapy already. Main concern is pain and open wound from the right breast.  XRT not indicated since this will worsen the drainage in the wound healing process.  I have discussed the case in detail with Dr. Janese Banks and she has asked me to consider a toilet/palliative mastectomy.  I personally review her PET/CT and there is obvious evidence of chest wall involvement.  She continues to have chronic drainage from the right necrotic wound from her cancer  Past Medical History:  Diagnosis Date  . Anemia   . Breast cancer [facility])     Past Surgical History:  Procedure Laterality Date  . BREAST BIOPSY Right 10/25/2018   Heart Clip, Pending path  . BREAST BIOPSY Right 10/25/2018   Lymph node biopsy, Hydromarker (butterfly), path pending  . CESAREAN SECTION    . CESAREAN SECTION N/A 04/24/2016   Procedure: REPEAT CESAREAN SECTION WITH BILATERAL TUBALIGATION;  Surgeon: Brayton Mars, MD;  Location: [facility] ORS;  Service: Obstetrics;  Laterality: N/A;  Baby Boy born @ 34 Apgars:8/9 Weight: 8lb 9oz  . PORTACATH PLACEMENT Left 11/05/2018   Procedure: INSERTION PORT-A-CATH;  Surgeon: Jules Husbands, MD;  Location: [facility] ORS;  Service: General;  Laterality: Left;    Family History  Problem Relation Age of Onset  . Hypertension Mother   . Stroke Father   . Cancer Maternal Grandmother   . Diabetes Maternal Grandfather   . Heart disease Maternal Grandfather   . Hypertension Maternal Grandfather     Social History:  reports that she has been smoking cigarettes. She has a 1.25 pack-year  smoking history. She has never used smokeless tobacco. She reports that she does not drink alcohol or use drugs.  Allergies: No Known Allergies  Medications reviewed.    ROS Full ROS performed and is otherwise negative other than what is stated in HPI   BP 113/73   Pulse 77   Temp 97.9 F (36.6 C)   Resp 14   Ht 5\' 4"  (1.626 m)   Wt 111 lb 6.4 oz (50.5 kg)   LMP 12/11/2018 (Exact Date)   SpO2 98%   BMI 19.12 kg/m   Physical Exam Vitals signs and nursing note reviewed. Exam conducted with a chaperone present.  Constitutional:      General: She is not in acute distress.    Appearance: Normal appearance. She is normal weight.  Eyes:     General:        Right eye: No discharge.        Left eye: No discharge.  Neck:     Musculoskeletal: Normal range of motion and neck supple. No neck rigidity.  Cardiovascular:     Rate and Rhythm: Normal rate and regular rhythm.  Pulmonary:     Effort: Pulmonary effort is normal. No respiratory distress.     Comments: BREAST: Fixed right breast mass attached to the chest wall.  None mobile.  There is evidence of a 3 x 3 cm wound at 12:00 involving the inframammary fold with some necrotic base.  No  evidence of active bleeding. Abdominal:     General: Abdomen is flat. There is no distension.     Tenderness: There is no abdominal tenderness. There is no guarding.  Musculoskeletal: Normal range of motion.  Skin:    General: Skin is warm and dry.     Capillary Refill: Capillary refill takes less than 2 seconds.  Neurological:     General: No focal deficit present.     Mental Status: She is alert and oriented to person, place, and time.  Psychiatric:        Mood and Affect: Mood normal.        Behavior: Behavior normal.        Thought Content: Thought content normal.        Judgment: Judgment normal.    Assessment/Plan: 33 year old female with metastatic triple negative breast cancer with chest wall involvement and a necrotic open  wound.  Consideration for total mastectomy is being considered.  My concern is  appropriate coverage of the large chest wall defect that I will create and the ability for a potential flap to take and heal in the setting of  chest wall invasion.  This is traditionally a very challenging issue.  I have discussed the case in detail with her and also with Dr. Janese Banks.  I do not think that there are good options.  She really wishes to have a mastectomy for toilet purposes.  I will send this patient to Dr. Marla Roe who is our plastic surgeon for consideration of potential Muscle  flap.  There is a chance that we may do a combined  mastectomy with muscle flap at the same time to allow coverage of that defect.  Patient understands that this is a complicated procedure with not necessarily good outcomes and this will be only used for palliation and for wound care purposes.  Alternative will be to send her to a tertiary center.  Not she wishes to stay locally if possible my first move will be to get plastic surgery involved if they think this is not appropriate I will make arrangements for a consultation at [facility].  Also discussed with her about another consultation at [facility] but she adamantly states that she wishes to go to [facility] if at second opinion is required  Greater than 50% of the 40 minutes  visit was spent in counseling/coordination of care   Caroleen Hamman, MD [place] Surgeon

## 2018-12-23 ENCOUNTER — Encounter: Payer: Self-pay | Admitting: Oncology

## 2018-12-23 ENCOUNTER — Telehealth: Payer: Self-pay

## 2018-12-23 ENCOUNTER — Ambulatory Visit: Payer: Medicaid Other | Admitting: Physician Assistant

## 2018-12-23 ENCOUNTER — Other Ambulatory Visit: Payer: Self-pay

## 2018-12-23 NOTE — Telephone Encounter (Signed)
Called and spoke with patient, was getting her pre charting done but patient would like to to hold off on chemo and feels surgery is the most important task at this time. Message sent to Dr. Janese Banks and Judeen Hammans for follow up and plan of action for patient

## 2018-12-23 NOTE — Telephone Encounter (Signed)

## 2018-12-23 NOTE — Progress Notes (Signed)
Patient would like to cancel her appointment since she is seeing the plastic surgeon.

## 2018-12-24 ENCOUNTER — Inpatient Hospital Stay: Payer: Medicaid Other | Admitting: Oncology

## 2018-12-24 ENCOUNTER — Encounter: Payer: Self-pay | Admitting: Plastic Surgery

## 2018-12-24 ENCOUNTER — Ambulatory Visit (INDEPENDENT_AMBULATORY_CARE_PROVIDER_SITE_OTHER): Payer: Medicaid Other | Admitting: Plastic Surgery

## 2018-12-24 ENCOUNTER — Inpatient Hospital Stay: Payer: Medicaid Other

## 2018-12-24 ENCOUNTER — Other Ambulatory Visit: Payer: Self-pay | Admitting: Oncology

## 2018-12-24 VITALS — BP 114/63 | HR 84 | Temp 97.8°F | Ht 64.0 in | Wt 109.8 lb

## 2018-12-24 DIAGNOSIS — Z171 Estrogen receptor negative status [ER-]: Secondary | ICD-10-CM | POA: Diagnosis not present

## 2018-12-24 DIAGNOSIS — C50511 Malignant neoplasm of lower-outer quadrant of right female breast: Secondary | ICD-10-CM | POA: Diagnosis not present

## 2018-12-24 MED ORDER — OXYCODONE HCL 10 MG PO TABS: 10.0000 mg | ORAL_TABLET | ORAL | 0 refills | Status: DC | PRN

## 2018-12-24 NOTE — Progress Notes (Signed)
Patient ID: [name], female    DOB: 08-31-1985, 33 y.o.   MRN: [id number]   Chief Complaint  Patient presents with  . Advice Only    for breast CA reconstruction with [place]  . Breast Cancer    The patient is a 33 year old female here for consultation for her right breast.  She was diagnosed with right invasive mammary carcinoma that is triple negative.  She complains of persistent pain of the right breast.  She has known metastatic disease to bone and chest wall.  She had a Port-A-Cath placed by Dr. Adora Fridge and has started her chemotherapy.  Previous surgery includes tubal ligation and breast biopsy.  She has CT and PET scan results that confirmed chest wall involvement.  She currently has wound of her right breast that is approximately 3 x 4 cm.  She is draining and complains of the pain.  She is 5 feet 4 inches tall and weighs 109 pounds.  She has 4 kids at home ranging from 17 to 2 years.   Review of Systems  Constitutional: Negative for appetite change.  HENT: Negative.   Eyes: Negative.   Respiratory: Positive for chest tightness.   Cardiovascular: Negative.   Gastrointestinal: Negative for abdominal pain.  Endocrine: Negative.   Genitourinary: Negative.   Skin: Positive for color change and wound.  Hematological: Negative.   Psychiatric/Behavioral: Negative.     Past Medical History:  Diagnosis Date  . Anemia   . Breast cancer [facility])     Past Surgical History:  Procedure Laterality Date  . BREAST BIOPSY Right 10/25/2018   Heart Clip, Pending path  . BREAST BIOPSY Right 10/25/2018   Lymph node biopsy, Hydromarker (butterfly), path pending  . CESAREAN SECTION    . CESAREAN SECTION N/A 04/24/2016   Procedure: REPEAT CESAREAN SECTION WITH BILATERAL TUBALIGATION;  Surgeon: Brayton Mars, MD;  Location: [facility] ORS;  Service: Obstetrics;  Laterality: N/A;  Baby Boy born @ 6 Apgars:8/9 Weight: 8lb 9oz  . PORTACATH PLACEMENT Left 11/05/2018   Procedure:  INSERTION PORT-A-CATH;  Surgeon: Jules Husbands, MD;  Location: [facility] ORS;  Service: General;  Laterality: Left;      Current Outpatient Medications:  .  dexamethasone (DECADRON) 4 MG tablet, Take 2 tablets by mouth once a day on the day after chemotherapy and then take 2 tablets two times a day for 2 days. Take with food., Disp: 30 tablet, Rfl: 1 .  gabapentin (NEURONTIN) 300 MG capsule, Take 1 capsule (300 mg total) by mouth 3 (three) times daily., Disp: 90 capsule, Rfl: 0 .  lidocaine-prilocaine (EMLA) cream, Apply 1 application topically as directed. Apply small amount over port site 1 hour before treatments, cover the cream with saran wrap to protect your clothing, Disp: 30 g, Rfl: 0 .  LORazepam (ATIVAN) 0.5 MG tablet, Take 1 tablet (0.5 mg total) by mouth every 6 (six) hours as needed (Nausea or vomiting)., Disp: 30 tablet, Rfl: 0 .  morphine (MS CONTIN) 15 MG 12 hr tablet, Take 1 tablet (15 mg total) by mouth every 12 (twelve) hours., Disp: 60 tablet, Rfl: 0 .  OLANZapine (ZYPREXA) 10 MG tablet, Take 1 tablet (10 mg total) by mouth at bedtime. Take a tablet day 2 through day 5 after each chemo treatment, Disp: 30 tablet, Rfl: 0 .  ondansetron (ZOFRAN) 8 MG tablet, Take 1 tablet (8 mg total) by mouth 2 (two) times daily as needed. Start on the third day after chemotherapy., Disp:  30 tablet, Rfl: 1 .  Oxycodone HCl 10 MG TABS, Take 1 tablet (10 mg total) by mouth every 4 (four) hours as needed., Disp: 120 tablet, Rfl: 0 .  prochlorperazine (COMPAZINE) 10 MG tablet, Take 1 tablet (10 mg total) by mouth every 6 (six) hours as needed (Nausea or vomiting)., Disp: 30 tablet, Rfl: 1   Objective:   Vitals:   12/24/18 1502  BP: 114/63  Pulse: 84  Temp: 97.8 F (36.6 C)  SpO2: 98%    Physical Exam Vitals signs and nursing note reviewed.  Constitutional:      Appearance: Normal appearance.  HENT:     Head: Normocephalic and atraumatic.  Eyes:     Extraocular Movements: Extraocular  movements intact.  Cardiovascular:     Rate and Rhythm: Normal rate.     Pulses: Normal pulses.  Pulmonary:     Effort: Pulmonary effort is normal. No respiratory distress.  Chest:    Abdominal:     General: Abdomen is flat. There is no distension.     Tenderness: There is no abdominal tenderness.  Skin:    General: Skin is warm.  Neurological:     General: No focal deficit present.     Mental Status: She is alert and oriented to person, place, and time.  Psychiatric:        Mood and Affect: Mood normal.        Behavior: Behavior normal.        Thought Content: Thought content normal.     Assessment & Plan:  Malignant neoplasm of lower-outer quadrant of right breast of female, estrogen receptor negative ([place])  We had a long discussion about her options.  Right now reconstruction is not the primary concern.  This could be a possibility in the far future.  Also reviewed all of her results and spoke with Dr. Adora Fridge about the following information.  I will plan to do the surgery with him.  My goal will be closure.  She may need a skin graft placed prior to a flat.  It depends on what we have after the toilet mastectomy.  I think we could probably get her closed.  Dr. Titus Mould agrees with the plan.  A latissimus muscle flap is an option if needed. Pictures were obtained of the patient and placed in the chart with the patient's or guardian's permission.   [place], DO

## 2018-12-25 ENCOUNTER — Inpatient Hospital Stay: Payer: Medicaid Other

## 2018-12-25 ENCOUNTER — Telehealth: Payer: Self-pay | Admitting: *Deleted

## 2018-12-25 ENCOUNTER — Other Ambulatory Visit: Payer: Self-pay

## 2018-12-25 NOTE — Progress Notes (Unsigned)
Patient pre screened for office appointment, no questions or concerns today. 

## 2018-12-25 NOTE — Telephone Encounter (Signed)
Called pt and spoke to her about how the appt went with plastic surgery and pt says that they are going ahead and scheduling surgery- the person who says they will call her back indicated that it would be scheduled in 1 week pt states. She says that she saw the dentist and she has abscess and she is to go back in 2 weeks to get it fixed she states. I told her that we had rescheduled her chemo and it is tom. At 8:15. I told her if she can come then dr Janese Banks and her will discuss the chemo while working around the srugery. She is agreeable to coming tom.

## 2018-12-26 ENCOUNTER — Encounter: Payer: Self-pay | Admitting: *Deleted

## 2018-12-26 ENCOUNTER — Inpatient Hospital Stay: Payer: Medicaid Other | Admitting: Oncology

## 2018-12-26 ENCOUNTER — Inpatient Hospital Stay: Payer: Medicaid Other

## 2018-12-26 ENCOUNTER — Telehealth: Payer: Self-pay | Admitting: Oncology

## 2018-12-26 NOTE — Telephone Encounter (Signed)
LM to try to R/S NS appt

## 2018-12-26 NOTE — Progress Notes (Signed)
Called and left patient a message to return my call.  I would like to follow up on her plans for mastectomy and chemotherapy.

## 2018-12-27 ENCOUNTER — Inpatient Hospital Stay: Payer: Medicaid Other

## 2019-01-01 ENCOUNTER — Telehealth: Payer: Self-pay | Admitting: *Deleted

## 2019-01-01 NOTE — Telephone Encounter (Signed)
Pt agreeable to the chemo appt on this Friday 10/30 at 8:15 arrival. And then she will get her injection on Monday. She thought that she did not get inj. Anymore but I let her know that everytime she gets this chemo she has to get injection. She is agreeable to plan

## 2019-01-01 NOTE — Progress Notes (Signed)
Attempted to connect with patient on several occasions but was unsuccessful.  Have left multiple messages with no response.   Faythe Casa, NP 01/01/2019 2:20 PM

## 2019-01-02 ENCOUNTER — Encounter: Payer: Self-pay | Admitting: Oncology

## 2019-01-02 ENCOUNTER — Other Ambulatory Visit: Payer: Self-pay

## 2019-01-02 NOTE — Progress Notes (Unsigned)
Patient stated that she had been constipated and taking Dulcolax for that but not helping much. Patient also stated that she has poor appetite. Patient stated that she is just not hungry. Patient stated that she had lost 4 lbs in the last week.

## 2019-01-03 ENCOUNTER — Inpatient Hospital Stay: Payer: Medicaid Other

## 2019-01-03 ENCOUNTER — Inpatient Hospital Stay: Payer: Medicaid Other | Admitting: Oncology

## 2019-01-03 ENCOUNTER — Telehealth: Payer: Self-pay | Admitting: Oncology

## 2019-01-03 ENCOUNTER — Telehealth: Payer: Self-pay

## 2019-01-03 NOTE — Telephone Encounter (Signed)
LM to try and R/S NS appointment.

## 2019-01-03 NOTE — Telephone Encounter (Signed)
Message left for the patient to call back to get her pre admission date/time, Covid Testing date and Surgery date.  Surgery Date: 01/23/19 Preadmission Testing Date: 01/17/19 phone call Covid Testing Date: 01/20/19 - patient advised to go to the [place] ([facility]) between 8-10:30 am.   Earl Lites Video sent via [facility] Surgical Video and Covid Education Video.  Patient has been made aware to call [phone], between 1-3:00pm the day before surgery, to find out what time to arrive.

## 2019-01-06 ENCOUNTER — Inpatient Hospital Stay: Payer: Medicaid Other

## 2019-01-07 NOTE — Telephone Encounter (Signed)
Spoke with patient and notified her of instructions and information.

## 2019-01-07 NOTE — Addendum Note (Signed)
Addended by: Caroleen Hamman F on: 01/07/2019 06:37 PM   Modules accepted: Orders, SmartSet

## 2019-01-08 ENCOUNTER — Other Ambulatory Visit: Payer: Medicaid Other

## 2019-01-10 ENCOUNTER — Ambulatory Visit: Payer: Medicaid Other | Attending: Radiation Oncology | Admitting: Radiation Oncology

## 2019-01-13 ENCOUNTER — Telehealth: Payer: Self-pay | Admitting: *Deleted

## 2019-01-13 ENCOUNTER — Other Ambulatory Visit: Payer: Self-pay | Admitting: Oncology

## 2019-01-13 MED ORDER — LIDOCAINE-PRILOCAINE 2.5-2.5 % EX CREA: 1.0000 "application " | TOPICAL_CREAM | CUTANEOUS | 0 refills | Status: AC

## 2019-01-13 MED ORDER — OXYCODONE HCL 10 MG PO TABS: 10.0000 mg | ORAL_TABLET | ORAL | 0 refills | Status: DC | PRN

## 2019-01-13 NOTE — Telephone Encounter (Signed)
Will try again later

## 2019-01-14 ENCOUNTER — Encounter: Payer: Self-pay | Admitting: Surgical

## 2019-01-14 ENCOUNTER — Ambulatory Visit (INDEPENDENT_AMBULATORY_CARE_PROVIDER_SITE_OTHER): Payer: Medicaid Other | Admitting: Plastic Surgery

## 2019-01-14 ENCOUNTER — Other Ambulatory Visit: Payer: Self-pay

## 2019-01-14 VITALS — BP 119/79 | HR 84 | Temp 97.7°F | Ht 65.0 in | Wt 106.8 lb

## 2019-01-14 DIAGNOSIS — Z171 Estrogen receptor negative status [ER-]: Secondary | ICD-10-CM

## 2019-01-14 DIAGNOSIS — C50511 Malignant neoplasm of lower-outer quadrant of right female breast: Secondary | ICD-10-CM

## 2019-01-14 MED ORDER — CEPHALEXIN 500 MG PO CAPS: 500.0000 mg | ORAL_CAPSULE | Freq: Four times a day (QID) | ORAL | 0 refills | Status: AC

## 2019-01-14 MED ORDER — ONDANSETRON HCL 4 MG PO TABS: 4.0000 mg | ORAL_TABLET | Freq: Three times a day (TID) | ORAL | 0 refills | Status: DC | PRN

## 2019-01-14 NOTE — Progress Notes (Signed)
ICD-10-CM   1. Malignant neoplasm of lower-outer quadrant of right breast of female, estrogen receptor negative ([place])  C50.511    Z17.1       Patient ID: [name], female    DOB: 12-20-1985, 33 y.o.   MRN: [id number]   History of Present Illness: [name] is a 33 y.o.  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  She presents for preoperative evaluation for upcoming procedure, mastectomy of the right breast with closure and possible split thickness graft., scheduled for 01/23/2019 with Dr. Dahlia Byes and Dr. Marla Roe.  She has an open wound of her inframammary fold of the right breast that has serous drainage, and necrotic tissue. (photo in chart) States lower portion of breast is very painful.  She had a Port-A-Cath placed by Dr. Dahlia Byes and has started chemotherapy. Previous surgeries include cesarean sections, breast biopsy, and tubal ligation.    States she quit smoking around her first Chemo Treatment (~Aug 2020), prior to this she was a 0.25 pack/day smoker for 5 years.  Patient understood that this surgery was for the mastectomy and closure and that breast reconstruction would be done at a later date if she was interested. She asked if breast reconstruction was required as she would like to focus on her current surgery and cancer treatments and did not feel reconstruction was something she would be interested in. I assured her that reconstruction was her choice (mentioning other options), not a requirement, and something she did not have to make a decision on today.  The patient has not had problems with anesthesia.   Past Medical History: Allergies: No Known Allergies  Current Medications:  Current Outpatient Medications:  .  dexamethasone (DECADRON) 4 MG tablet, Take 2 tablets by mouth once a day on the day after chemotherapy and then take 2 tablets two times a day for  2 days. Take with food., Disp: 30 tablet, Rfl: 1 .  gabapentin (NEURONTIN) 300 MG capsule, Take 1 capsule (300 mg total) by mouth 3 (three) times daily., Disp: 90 capsule, Rfl: 0 .  lidocaine-prilocaine (EMLA) cream, Apply 1 application topically as directed. Apply small amount over port site 1 hour before treatments, cover the cream with saran wrap to protect your clothing, Disp: 30 g, Rfl: 0 .  LORazepam (ATIVAN) 0.5 MG tablet, Take 1 tablet (0.5 mg total) by mouth every 6 (six) hours as needed (Nausea or vomiting)., Disp: 30 tablet, Rfl: 0 .  OLANZapine (ZYPREXA) 10 MG tablet, Take 1 tablet (10 mg total) by mouth at bedtime. Take a tablet day 2 through day 5 after each chemo treatment, Disp: 30 tablet, Rfl: 0 .  ondansetron (ZOFRAN) 8 MG tablet, Take 1 tablet (8 mg total) by mouth 2 (two) times daily as needed. Start on the third day after chemotherapy., Disp: 30 tablet, Rfl: 1 .  Oxycodone HCl 10 MG TABS, Take 1 tablet (10 mg total) by mouth every 4 (four) hours as needed for up to 15 days., Disp: 90 tablet, Rfl: 0 .  prochlorperazine (COMPAZINE) 10 MG tablet, Take 1 tablet (10 mg total) by mouth every 6 (six) hours as needed (Nausea or vomiting)., Disp: 30 tablet, Rfl: 1  Past Medical Problems: Past Medical History:  Diagnosis Date  . Anemia   . Breast cancer [facility])     Past Surgical History: Past Surgical History:  Procedure Laterality Date  .  BREAST BIOPSY Right 10/25/2018   Heart Clip, Pending path  . BREAST BIOPSY Right 10/25/2018   Lymph node biopsy, Hydromarker (butterfly), path pending  . CESAREAN SECTION    . CESAREAN SECTION N/A 04/24/2016   Procedure: REPEAT CESAREAN SECTION WITH BILATERAL TUBALIGATION;  Surgeon: Brayton Mars, MD;  Location: [facility] ORS;  Service: Obstetrics;  Laterality: N/A;  Baby Boy born @ 69 Apgars:8/9 Weight: 8lb 9oz  . PORTACATH PLACEMENT Left 11/05/2018   Procedure: INSERTION PORT-A-CATH;  Surgeon: Jules Husbands, MD;  Location: [facility] ORS;   Service: General;  Laterality: Left;    Social History: Social History   Socioeconomic History  . Marital status: Single    Spouse name: Not on file  . Number of children: Not on file  . Years of education: Not on file  . Highest education level: Not on file  Occupational History  . Not on file  Social Needs  . Financial resource strain: Not on file  . Food insecurity    Worry: Not on file    Inability: Not on file  . Transportation needs    Medical: Not on file    Non-medical: Not on file  Tobacco Use  . Smoking status: Current Every Day Smoker    Packs/day: 0.25    Years: 5.00    Pack years: 1.25    Types: Cigarettes  . Smokeless tobacco: Never Used  Substance and Sexual Activity  . Alcohol use: No  . Drug use: No  . Sexual activity: Yes  Lifestyle  . Physical activity    Days per week: Not on file    Minutes per session: Not on file  . Stress: Not on file  Relationships  . Social Herbalist on phone: Not on file    Gets together: Not on file    Attends religious service: Not on file    Active member of club or organization: Not on file    Attends meetings of clubs or organizations: Not on file    Relationship status: Not on file  . Intimate partner violence    Fear of current or ex partner: Not on file    Emotionally abused: Not on file    Physically abused: Not on file    Forced sexual activity: Not on file  Other Topics Concern  . Not on file  Social History Narrative  . Not on file   Family History: Family History  Problem Relation Age of Onset  . Hypertension Mother   . Stroke Father   . Cancer Maternal Grandmother   . Diabetes Maternal Grandfather   . Heart disease Maternal Grandfather   . Hypertension Maternal Grandfather     Review of Systems: Review of Systems  Constitutional: Negative for chills and fever.  HENT: Negative for congestion.   Respiratory: Negative for cough and shortness of breath.   Cardiovascular: Negative  for chest pain and palpitations.  Gastrointestinal: Positive for constipation (using dulcolax) and nausea (after chemo treatments). Negative for diarrhea and vomiting.  Genitourinary: Negative for dysuria.  Musculoskeletal: Negative.   Skin:       Painful wound to breast, tender to the touch, draining  Neurological: Negative for dizziness and headaches.    Physical Exam: Vital Signs BP 119/79 (BP Location: Left Arm, Patient Position: Sitting, Cuff Size: Normal)   Pulse 84   Temp 97.7 F (36.5 C) (Temporal)   Ht 5\' 5"  (1.651 m)   Wt 106 lb 12.8 oz (48.4 kg)  LMP 12/14/2018 (Approximate)   SpO2 99%   BMI 17.77 kg/m  Physical Exam Vitals signs and nursing note reviewed.  Constitutional:      Appearance: Normal appearance. She is underweight.     Comments: Lost 3 lbs since 12/24/18 visit  HENT:     Head: Normocephalic and atraumatic.  Eyes:     Extraocular Movements: Extraocular movements intact.  Neck:     Musculoskeletal: Normal range of motion.  Cardiovascular:     Rate and Rhythm: Normal rate and regular rhythm.     Pulses: Normal pulses.     Heart sounds: Normal heart sounds. No murmur. No friction rub. No gallop.   Pulmonary:     Effort: Pulmonary effort is normal.     Breath sounds: Normal breath sounds.  Chest:       Comments: Open wound, clear drainage, foul odor, necrotic tissue present Abdominal:     General: Bowel sounds are normal.     Palpations: Abdomen is soft.     Tenderness: There is no abdominal tenderness. There is no guarding.  Musculoskeletal: Normal range of motion.        General: No swelling.  Skin:    General: Skin is warm and dry.     Findings: No rash.  Neurological:     General: No focal deficit present.     Mental Status: She is alert and oriented to person, place, and time.  Psychiatric:        Mood and Affect: Mood normal.        Behavior: Behavior normal.        Thought Content: Thought content normal.    Assessment/Plan:   Ms. Grice is scheduled for mastectomy of the right breast with closure and possible split thickness graft with Dr. Dahlia Byes and Dr. Marla Roe.  Risks, benefits, and alternatives of procedure discussed, questions answered and consent obtained.    Caprini Risk score: 3-4; recommend mechanical prophylaxis day of surgery    Prior to surgery, keep wound clean, place Vaseline on edges of wound and cover with gauze.   Pain is currently managed by Oncology. Patient taking Oxycodone. Current Rx will take patient thru 01/28/2019 (Post Op Day 5). No addition pain medication provided.  Patient currently taking Zofran. Providing additional Rx in case she runs out prior to surgery.  Rx for generic Keflex and Zofran sent to pharmacy.    Pictures were obtained of the patient and placed in the chart with the patient's or guardian's permission.  Electronically signed by: Threasa Heads, PA-C 01/14/2019 3:42 PM

## 2019-01-14 NOTE — H&P (View-Only) (Signed)
ICD-10-CM   1. Malignant neoplasm of lower-outer quadrant of right breast of female, estrogen receptor negative ([place])  C50.511    Z17.1       Patient ID: [name], female    DOB: Nov 01, 1985, 33 y.o.   MRN: [id number]   History of Present Illness: [name] is a 33 y.o.  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  She presents for preoperative evaluation for upcoming procedure, mastectomy of the right breast with closure and possible split thickness graft., scheduled for 01/23/2019 with Dr. Dahlia Byes and Dr. Marla Roe.  She has an open wound of her inframammary fold of the right breast that has serous drainage, and necrotic tissue. (photo in chart) States lower portion of breast is very painful.  She had a Port-A-Cath placed by Dr. Dahlia Byes and has started chemotherapy. Previous surgeries include cesarean sections, breast biopsy, and tubal ligation.    States she quit smoking around her first Chemo Treatment (~Aug 2020), prior to this she was a 0.25 pack/day smoker for 5 years.  Patient understood that this surgery was for the mastectomy and closure and that breast reconstruction would be done at a later date if she was interested. She asked if breast reconstruction was required as she would like to focus on her current surgery and cancer treatments and did not feel reconstruction was something she would be interested in. I assured her that reconstruction was her choice (mentioning other options), not a requirement, and something she did not have to make a decision on today.  The patient has not had problems with anesthesia.   Past Medical History: Allergies: No Known Allergies  Current Medications:  Current Outpatient Medications:  .  dexamethasone (DECADRON) 4 MG tablet, Take 2 tablets by mouth once a day on the day after chemotherapy and then take 2 tablets two times a day for  2 days. Take with food., Disp: 30 tablet, Rfl: 1 .  gabapentin (NEURONTIN) 300 MG capsule, Take 1 capsule (300 mg total) by mouth 3 (three) times daily., Disp: 90 capsule, Rfl: 0 .  lidocaine-prilocaine (EMLA) cream, Apply 1 application topically as directed. Apply small amount over port site 1 hour before treatments, cover the cream with saran wrap to protect your clothing, Disp: 30 g, Rfl: 0 .  LORazepam (ATIVAN) 0.5 MG tablet, Take 1 tablet (0.5 mg total) by mouth every 6 (six) hours as needed (Nausea or vomiting)., Disp: 30 tablet, Rfl: 0 .  OLANZapine (ZYPREXA) 10 MG tablet, Take 1 tablet (10 mg total) by mouth at bedtime. Take a tablet day 2 through day 5 after each chemo treatment, Disp: 30 tablet, Rfl: 0 .  ondansetron (ZOFRAN) 8 MG tablet, Take 1 tablet (8 mg total) by mouth 2 (two) times daily as needed. Start on the third day after chemotherapy., Disp: 30 tablet, Rfl: 1 .  Oxycodone HCl 10 MG TABS, Take 1 tablet (10 mg total) by mouth every 4 (four) hours as needed for up to 15 days., Disp: 90 tablet, Rfl: 0 .  prochlorperazine (COMPAZINE) 10 MG tablet, Take 1 tablet (10 mg total) by mouth every 6 (six) hours as needed (Nausea or vomiting)., Disp: 30 tablet, Rfl: 1  Past Medical Problems: Past Medical History:  Diagnosis Date  . Anemia   . Breast cancer [facility])     Past Surgical History: Past Surgical History:  Procedure Laterality Date  .  BREAST BIOPSY Right 10/25/2018   Heart Clip, Pending path  . BREAST BIOPSY Right 10/25/2018   Lymph node biopsy, Hydromarker (butterfly), path pending  . CESAREAN SECTION    . CESAREAN SECTION N/A 04/24/2016   Procedure: REPEAT CESAREAN SECTION WITH BILATERAL TUBALIGATION;  Surgeon: Brayton Mars, MD;  Location: [facility] ORS;  Service: Obstetrics;  Laterality: N/A;  Baby Boy born @ 16 Apgars:8/9 Weight: 8lb 9oz  . PORTACATH PLACEMENT Left 11/05/2018   Procedure: INSERTION PORT-A-CATH;  Surgeon: Jules Husbands, MD;  Location: [facility] ORS;   Service: General;  Laterality: Left;    Social History: Social History   Socioeconomic History  . Marital status: Single    Spouse name: Not on file  . Number of children: Not on file  . Years of education: Not on file  . Highest education level: Not on file  Occupational History  . Not on file  Social Needs  . Financial resource strain: Not on file  . Food insecurity    Worry: Not on file    Inability: Not on file  . Transportation needs    Medical: Not on file    Non-medical: Not on file  Tobacco Use  . Smoking status: Current Every Day Smoker    Packs/day: 0.25    Years: 5.00    Pack years: 1.25    Types: Cigarettes  . Smokeless tobacco: Never Used  Substance and Sexual Activity  . Alcohol use: No  . Drug use: No  . Sexual activity: Yes  Lifestyle  . Physical activity    Days per week: Not on file    Minutes per session: Not on file  . Stress: Not on file  Relationships  . Social Herbalist on phone: Not on file    Gets together: Not on file    Attends religious service: Not on file    Active member of club or organization: Not on file    Attends meetings of clubs or organizations: Not on file    Relationship status: Not on file  . Intimate partner violence    Fear of current or ex partner: Not on file    Emotionally abused: Not on file    Physically abused: Not on file    Forced sexual activity: Not on file  Other Topics Concern  . Not on file  Social History Narrative  . Not on file   Family History: Family History  Problem Relation Age of Onset  . Hypertension Mother   . Stroke Father   . Cancer Maternal Grandmother   . Diabetes Maternal Grandfather   . Heart disease Maternal Grandfather   . Hypertension Maternal Grandfather     Review of Systems: Review of Systems  Constitutional: Negative for chills and fever.  HENT: Negative for congestion.   Respiratory: Negative for cough and shortness of breath.   Cardiovascular: Negative  for chest pain and palpitations.  Gastrointestinal: Positive for constipation (using dulcolax) and nausea (after chemo treatments). Negative for diarrhea and vomiting.  Genitourinary: Negative for dysuria.  Musculoskeletal: Negative.   Skin:       Painful wound to breast, tender to the touch, draining  Neurological: Negative for dizziness and headaches.    Physical Exam: Vital Signs BP 119/79 (BP Location: Left Arm, Patient Position: Sitting, Cuff Size: Normal)   Pulse 84   Temp 97.7 F (36.5 C) (Temporal)   Ht 5\' 5"  (1.651 m)   Wt 106 lb 12.8 oz (48.4 kg)  LMP 12/14/2018 (Approximate)   SpO2 99%   BMI 17.77 kg/m  Physical Exam Vitals signs and nursing note reviewed.  Constitutional:      Appearance: Normal appearance. She is underweight.     Comments: Lost 3 lbs since 12/24/18 visit  HENT:     Head: Normocephalic and atraumatic.  Eyes:     Extraocular Movements: Extraocular movements intact.  Neck:     Musculoskeletal: Normal range of motion.  Cardiovascular:     Rate and Rhythm: Normal rate and regular rhythm.     Pulses: Normal pulses.     Heart sounds: Normal heart sounds. No murmur. No friction rub. No gallop.   Pulmonary:     Effort: Pulmonary effort is normal.     Breath sounds: Normal breath sounds.  Chest:       Comments: Open wound, clear drainage, foul odor, necrotic tissue present Abdominal:     General: Bowel sounds are normal.     Palpations: Abdomen is soft.     Tenderness: There is no abdominal tenderness. There is no guarding.  Musculoskeletal: Normal range of motion.        General: No swelling.  Skin:    General: Skin is warm and dry.     Findings: No rash.  Neurological:     General: No focal deficit present.     Mental Status: She is alert and oriented to person, place, and time.  Psychiatric:        Mood and Affect: Mood normal.        Behavior: Behavior normal.        Thought Content: Thought content normal.    Assessment/Plan:   Ms. Alberto is scheduled for mastectomy of the right breast with closure and possible split thickness graft with Dr. Dahlia Byes and Dr. Marla Roe.  Risks, benefits, and alternatives of procedure discussed, questions answered and consent obtained.    Caprini Risk score: 3-4; recommend mechanical prophylaxis day of surgery    Prior to surgery, keep wound clean, place Vaseline on edges of wound and cover with gauze.   Pain is currently managed by Oncology. Patient taking Oxycodone. Current Rx will take patient thru 01/28/2019 (Post Op Day 5). No addition pain medication provided.  Patient currently taking Zofran. Providing additional Rx in case she runs out prior to surgery.  Rx for generic Keflex and Zofran sent to pharmacy.    Pictures were obtained of the patient and placed in the chart with the patient's or guardian's permission.  Electronically signed by: Threasa Heads, PA-C 01/14/2019 3:42 PM

## 2019-01-17 ENCOUNTER — Inpatient Hospital Stay: Admission: RE | Admit: 2019-01-17 | Payer: Medicaid Other | Source: Ambulatory Visit

## 2019-01-17 ENCOUNTER — Other Ambulatory Visit: Payer: Medicaid Other

## 2019-01-18 ENCOUNTER — Emergency Department
Admission: EM | Admit: 2019-01-18 | Discharge: 2019-01-18 | Disposition: A | Discharge status: Home / Self Care | Payer: Medicaid Other | Attending: Emergency Medicine | Admitting: Emergency Medicine

## 2019-01-18 ENCOUNTER — Emergency Department: Payer: Medicaid Other

## 2019-01-18 ENCOUNTER — Other Ambulatory Visit: Payer: Self-pay

## 2019-01-18 ENCOUNTER — Encounter: Payer: Self-pay | Admitting: Emergency Medicine

## 2019-01-18 DIAGNOSIS — Z79899 Other long term (current) drug therapy: Secondary | ICD-10-CM | POA: Insufficient documentation

## 2019-01-18 DIAGNOSIS — K5900 Constipation, unspecified: Secondary | ICD-10-CM

## 2019-01-18 DIAGNOSIS — M545 Low back pain, unspecified: Secondary | ICD-10-CM

## 2019-01-18 DIAGNOSIS — F17210 Nicotine dependence, cigarettes, uncomplicated: Secondary | ICD-10-CM | POA: Diagnosis not present

## 2019-01-18 LAB — URINALYSIS, COMPLETE (UACMP) WITH MICROSCOPIC
Bacteria, UA: NONE SEEN
Bilirubin Urine: NEGATIVE
Glucose, UA: NEGATIVE mg/dL
Ketones, ur: NEGATIVE mg/dL
Nitrite: NEGATIVE
Protein, ur: NEGATIVE mg/dL
Specific Gravity, Urine: 1.019 (ref 1.005–1.030)
pH: 6 (ref 5.0–8.0)

## 2019-01-18 LAB — HEPATIC FUNCTION PANEL
ALT: 30 U/L (ref 0–44)
AST: 40 U/L (ref 15–41)
Albumin: 3.5 g/dL (ref 3.5–5.0)
Alkaline Phosphatase: 92 U/L (ref 38–126)
Bilirubin, Direct: <0.1 mg/dL (ref 0.0–0.2)
Total Bilirubin: 0.3 mg/dL (ref 0.3–1.2)
Total Protein: 7.7 g/dL (ref 6.5–8.1)

## 2019-01-18 LAB — CBC
HCT: 30.9 % — ABNORMAL LOW (ref 36.0–46.0)
Hemoglobin: 10.2 g/dL — ABNORMAL LOW (ref 12.0–15.0)
MCH: 25.5 pg — ABNORMAL LOW (ref 26.0–34.0)
MCHC: 33 g/dL (ref 30.0–36.0)
MCV: 77.3 fL — ABNORMAL LOW (ref 80.0–100.0)
Platelets: 435 10*3/uL — ABNORMAL HIGH (ref 150–400)
RBC: 4 MIL/uL (ref 3.87–5.11)
WBC: 13.9 10*3/uL — ABNORMAL HIGH (ref 4.0–10.5)
nRBC: 0 % (ref 0.0–0.2)

## 2019-01-18 LAB — BASIC METABOLIC PANEL
Anion gap: 11 (ref 5–15)
BUN: 8 mg/dL (ref 6–20)
CO2: 25 mmol/L (ref 22–32)
Calcium: 9.2 mg/dL (ref 8.9–10.3)
Chloride: 101 mmol/L (ref 98–111)
Creatinine, Ser: 0.66 mg/dL (ref 0.44–1.00)
GFR calc Af Amer: >60 mL/min (ref >60)
GFR calc non Af Amer: >60 mL/min (ref >60)
Glucose, Bld: 119 mg/dL — ABNORMAL HIGH (ref 70–99)
Potassium: 3.8 mmol/L (ref 3.5–5.1)
Sodium: 137 mmol/L (ref 135–145)

## 2019-01-18 LAB — POCT PREGNANCY, URINE: Preg Test, Ur: NEGATIVE

## 2019-01-18 MED ORDER — IOHEXOL 9 MG/ML PO SOLN
500.0000 mL | ORAL | Status: AC
  Administered 2019-01-18 (×2): 500 mL via ORAL

## 2019-01-18 MED ORDER — POLYETHYLENE GLYCOL 3350 17 GM/SCOOP PO POWD: 1.0000 | Freq: Once | ORAL | 0 refills | Status: AC

## 2019-01-18 MED ORDER — IOHEXOL 300 MG/ML  SOLN
75.0000 mL | Freq: Once | INTRAMUSCULAR | Status: AC | PRN
  Administered 2019-01-18: 75 mL via INTRAVENOUS

## 2019-01-18 NOTE — Discharge Instructions (Addendum)
Use the MiraLAX 1 scoop in the morning and 1 scoop in the evening tomorrow and then 1 scoop a day until you have good bowel movements.  Return for increasing pain fever vomiting or any other complaints.  Use Tylenol for the back pain.  If you need something more you can use your morphine but remember the morphine will make you constipated to.  Follow-up with your regular doctors as well.

## 2019-01-18 NOTE — ED Notes (Signed)
Patient transported to CT 

## 2019-01-18 NOTE — ED Notes (Signed)
Patient resting quietly in room with no apparent acute distress at this time.  Pt is drinking oral contrast solution.  Will continue to monitor.

## 2019-01-18 NOTE — ED Triage Notes (Addendum)
Patient presents to the ED with lower back pain x 2 days that is worse when she eats/drinks.  Patient states she has been constipated but used an enema and had a bowel movement this am and it did not relieve her back pain.  Patient is currently being treated for breast cancer.  Patient states that for the past two days she has had difficulty knowing when she needs to urinate and has been going to the bathroom occasionally "just in case".  Patient states this is not normal for her.

## 2019-01-18 NOTE — ED Provider Notes (Signed)
[facility] Emergency Department Provider Note   ____________________________________________   First MD Initiated Contact with Patient 01/18/19 1746     (approximate)  I have reviewed the triage vital signs and the nursing notes.   HISTORY  Chief Complaint Back Pain    HPI [name] is a 33 y.o. female who is being treated for breast cancer.  She is complaining of pain in the left lower back.  Is worse when she eats.  Been constipated for some time gave herself an enema yesterday this did not help the pain however.  She did have a good bowel movement though.  She is post to have a mastectomy in a couple days.  She says she has been having trouble telling when she has to urinate.  She has been going without difficulty and has no numbness in her perineum at all.         Past Medical History:  Diagnosis Date  . Anemia   . Breast cancer [facility])     Patient Active Problem List   Diagnosis Date Noted  . Goals of care, counseling/discussion 11/08/2018  . Breast cancer ([place]) 11/07/2018  . Iron deficiency anemia 11/05/2018  . Anemia 04/28/2016  . Status post tubal ligation 04/28/2016  . Status post repeat low transverse cesarean section 04/24/2016  . Back pain 01/17/2016    Past Surgical History:  Procedure Laterality Date  . BREAST BIOPSY Right 10/25/2018   Heart Clip, Pending path  . BREAST BIOPSY Right 10/25/2018   Lymph node biopsy, Hydromarker (butterfly), path pending  . CESAREAN SECTION    . CESAREAN SECTION N/A 04/24/2016   Procedure: REPEAT CESAREAN SECTION WITH BILATERAL TUBALIGATION;  Surgeon: Brayton Mars, MD;  Location: [facility] ORS;  Service: Obstetrics;  Laterality: N/A;  Baby Boy born @ 55 Apgars:8/9 Weight: 8lb 9oz  . PORTACATH PLACEMENT Left 11/05/2018   Procedure: INSERTION PORT-A-CATH;  Surgeon: Jules Husbands, MD;  Location: [facility] ORS;  Service: General;  Laterality: Left;    Prior to Admission medications    Medication Sig Start Date End Date Taking? Authorizing Provider  dexamethasone (DECADRON) 4 MG tablet Take 2 tablets by mouth once a day on the day after chemotherapy and then take 2 tablets two times a day for 2 days. Take with food. 11/07/18   Sindy Guadeloupe, MD  gabapentin (NEURONTIN) 300 MG capsule Take 1 capsule (300 mg total) by mouth 3 (three) times daily. 11/04/18   Pabon, Cross, MD  lidocaine-prilocaine (EMLA) cream Apply 1 application topically as directed. Apply small amount over port site 1 hour before treatments, cover the cream with saran wrap to protect your clothing 01/13/19   Sindy Guadeloupe, MD  LORazepam (ATIVAN) 0.5 MG tablet Take 1 tablet (0.5 mg total) by mouth every 6 (six) hours as needed (Nausea or vomiting). 11/07/18   Sindy Guadeloupe, MD  OLANZapine (ZYPREXA) 10 MG tablet Take 1 tablet (10 mg total) by mouth at bedtime. Take a tablet day 2 through day 5 after each chemo treatment 12/03/18   Sindy Guadeloupe, MD  ondansetron (ZOFRAN) 4 MG tablet Take 1 tablet (4 mg total) by mouth every 8 (eight) hours as needed for nausea or vomiting. 01/14/19   Young, Johanna C, PA-C  ondansetron (ZOFRAN) 8 MG tablet Take 1 tablet (8 mg total) by mouth 2 (two) times daily as needed. Start on the third day after chemotherapy. 11/07/18   Sindy Guadeloupe, MD  Oxycodone HCl 10  MG TABS Take 1 tablet (10 mg total) by mouth every 4 (four) hours as needed for up to 15 days. 01/13/19 01/28/19  Sindy Guadeloupe, MD  polyethylene glycol powder (MIRALAX) 17 GM/SCOOP powder Take 255 g by mouth once for 1 dose. 01/18/19 01/18/19  Nena Polio, MD  prochlorperazine (COMPAZINE) 10 MG tablet Take 1 tablet (10 mg total) by mouth every 6 (six) hours as needed (Nausea or vomiting). 11/07/18   Sindy Guadeloupe, MD    Allergies Patient has no known allergies.  Family History  Problem Relation Age of Onset  . Hypertension Mother   . Stroke Father   . Cancer Maternal Grandmother   . Diabetes Maternal Grandfather   . Heart  disease Maternal Grandfather   . Hypertension Maternal Grandfather     Social History Social History   Tobacco Use  . Smoking status: Current Every Day Smoker    Packs/day: 0.25    Years: 5.00    Pack years: 1.25    Types: Cigarettes  . Smokeless tobacco: Never Used  Substance Use Topics  . Alcohol use: No  . Drug use: No    Review of Systems  Constitutional: No fever/chills Eyes: No visual changes. ENT: No sore throat. Cardiovascular: Denies chest pain. Respiratory: Denies shortness of breath. Gastrointestinal: No abdominal pain.  No nausea, no vomiting.  No diarrhea.  No constipation. Genitourinary: Negative for dysuria. Musculoskeletal: Negative for back pain.  Except for in the left lower quadrant of her back which is not tender with palpation or percussion. Skin: Negative for rash. Neurological: Negative for headaches, focal weakness  ____________________________________________   PHYSICAL EXAM:  VITAL SIGNS: ED Triage Vitals  Enc Vitals Group     BP 01/18/19 1514 130/79     Pulse Rate 01/18/19 1514 (!) 105     Resp 01/18/19 1514 18     Temp 01/18/19 1514 99.3 F (37.4 C)     Temp Source 01/18/19 1514 Oral     SpO2 01/18/19 1514 96 %     Weight 01/18/19 1516 106 lb (48.1 kg)     Height 01/18/19 1516 5\' 5"  (1.651 m)     Head Circumference --      Peak Flow --      Pain Score 01/18/19 1516 8     Pain Loc --      Pain Edu? --      Excl. in [place]? --     Constitutional: Alert and oriented. Well appearing and in no acute distress. Eyes: Conjunctivae are normal.  Head: Atraumatic. Nose: No congestion/rhinnorhea. Mouth/Throat: Mucous membranes are moist.  Oropharynx non-erythematous. Neck: No stridor.   Cardiovascular: Normal rate, regular rhythm. Grossly normal heart sounds.  Good peripheral circulation. Respiratory: Normal respiratory effort.  No retractions. Lungs CTAB. Gastrointestinal: Soft and nontender. No distention. No abdominal bruits. No CVA  tenderness. Musculoskeletal: No lower extremity tenderness nor edema.   Neurologic:  Normal speech and language. No gross focal neurologic deficits are appreciated.  Skin:  Skin is warm, dry and intact. No rash noted.   ____________________________________________   LABS (all labs ordered are listed, but only abnormal results are displayed)  Labs Reviewed  URINALYSIS, COMPLETE (UACMP) WITH MICROSCOPIC - Abnormal; Notable for the following components:      Result Value   Color, Urine YELLOW (*)    APPearance HAZY (*)    Hgb urine dipstick MODERATE (*)    Leukocytes,Ua TRACE (*)    All other components within normal limits  BASIC METABOLIC PANEL - Abnormal; Notable for the following components:   Glucose, Bld 119 (*)    All other components within normal limits  CBC - Abnormal; Notable for the following components:   WBC 13.9 (*)    Hemoglobin 10.2 (*)    HCT 30.9 (*)    MCV 77.3 (*)    MCH 25.5 (*)    Platelets 435 (*)    All other components within normal limits  HEPATIC FUNCTION PANEL  POC URINE PREG, ED  POCT PREGNANCY, URINE   ____________________________________________  EKG   ____________________________________________  RADIOLOGY  ED MD interpretation: CT scan shows a lot of gas and stool.  It is indenting her bladder to a considerable degree.  Official radiology report(s): Ct Abdomen Pelvis W Contrast  Result Date: 01/18/2019 CLINICAL DATA:  Breast cancer.  Left lower back pain. EXAM: CT ABDOMEN AND PELVIS WITH CONTRAST TECHNIQUE: Multidetector CT imaging of the abdomen and pelvis was performed using the standard protocol following bolus administration of intravenous contrast. CONTRAST:  35mL OMNIPAQUE IOHEXOL 300 MG/ML  SOLN COMPARISON:  None. FINDINGS: Lower chest: Small right pleural effusion. Lung bases clear. Heart is normal size. Hepatobiliary: Numerous low-density lesions throughout the liver compatible with metastases. Index lesion in the posterior  right hepatic lobe measures 2.1 cm. Gallbladder unremarkable. Pancreas: No focal abnormality or ductal dilatation. Spleen: No focal abnormality.  Normal size. Adrenals/Urinary Tract: No adrenal abnormality. No focal renal abnormality. No stones or hydronephrosis. Urinary bladder is unremarkable. Stomach/Bowel: Large stool burden in the cecum and ascending colon with mild gaseous distention of the right colon and transverse colon. No evidence of bowel obstruction. Stomach and small bowel grossly unremarkable. Vascular/Lymphatic: No evidence of aneurysm or adenopathy. Reproductive: Uterus and adnexa unremarkable.  No mass. Other: No free fluid or free air. Musculoskeletal: Sclerotic lesions throughout the bony pelvis as well as lumbar spine and lower thoracic spine compatible with sclerotic metastases. IMPRESSION: Numerous ill-defined low-density lesions throughout the liver compatible with metastases. Numerous rounded sclerotic lesions throughout the visualized thoracolumbar spine and pelvis compatible with metastatic disease. Large stool burden and mild gaseous distention of the right colon. Electronically Signed   By: Rolm Baptise M.D.   On: 01/18/2019 19:58    ____________________________________________   PROCEDURES  Procedure(s) performed (including Critical Care):  Procedures   ____________________________________________   INITIAL IMPRESSION / ASSESSMENT AND PLAN / ED COURSE  Gust with patient.  She does not want to try another enema.  We will try some MiraLAX.  She will return if she has any further problems.  She does not have any trouble urinating she just cannot always tell when she has to go.  This may be because her bladder is reduced in volume by about half by her colon.              ____________________________________________   FINAL CLINICAL IMPRESSION(S) / ED DIAGNOSES  Final diagnoses:  Acute left-sided low back pain without sciatica  Constipation, unspecified  constipation type     ED Discharge Orders         Ordered    polyethylene glycol powder (MIRALAX) 17 GM/SCOOP powder   Once     01/18/19 2123           Note:  This document was prepared using Dragon voice recognition software and may include unintentional dictation errors.    Nena Polio, MD 01/18/19 [phone]

## 2019-01-20 ENCOUNTER — Encounter
Admission: RE | Admit: 2019-01-20 | Discharge: 2019-01-20 | Disposition: A | Discharge status: Home / Self Care | Payer: Medicaid Other | Source: Ambulatory Visit | Attending: Surgery | Admitting: Surgery

## 2019-01-20 ENCOUNTER — Other Ambulatory Visit: Payer: Self-pay

## 2019-01-20 ENCOUNTER — Other Ambulatory Visit
Admission: RE | Admit: 2019-01-20 | Discharge: 2019-01-20 | Disposition: A | Discharge status: Home / Self Care | Payer: Medicaid Other | Source: Ambulatory Visit | Attending: Surgery | Admitting: Surgery

## 2019-01-20 DIAGNOSIS — Z01812 Encounter for preprocedural laboratory examination: Secondary | ICD-10-CM | POA: Diagnosis not present

## 2019-01-20 DIAGNOSIS — Z20828 Contact with and (suspected) exposure to other viral communicable diseases: Secondary | ICD-10-CM | POA: Diagnosis not present

## 2019-01-20 LAB — CBC
HCT: 33.7 % — ABNORMAL LOW (ref 36.0–46.0)
Hemoglobin: 11 g/dL — ABNORMAL LOW (ref 12.0–15.0)
MCH: 25.2 pg — ABNORMAL LOW (ref 26.0–34.0)
MCHC: 32.6 g/dL (ref 30.0–36.0)
MCV: 77.3 fL — ABNORMAL LOW (ref 80.0–100.0)
Platelets: 409 10*3/uL — ABNORMAL HIGH (ref 150–400)
RBC: 4.36 MIL/uL (ref 3.87–5.11)
WBC: 16.6 10*3/uL — ABNORMAL HIGH (ref 4.0–10.5)
nRBC: 0 % (ref 0.0–0.2)

## 2019-01-20 LAB — SARS CORONAVIRUS 2 (TAT 6-24 HRS): SARS Coronavirus 2: NEGATIVE

## 2019-01-20 NOTE — Patient Instructions (Addendum)
Your procedure is scheduled on: Thursday, November 19 Report to Day Surgery on the 2nd floor of the [facility]. To find out your arrival time, please call [phone] between 1PM - 3PM on: Wednesday, November 18  REMEMBER: Instructions that are not followed completely may result in serious medical risk, up to and including death; or upon the discretion of your surgeon and anesthesiologist your surgery may need to be rescheduled.  Do not eat food after midnight the night before surgery.  No gum chewing, lozengers or hard candies.  You may however, drink CLEAR liquids up to 2 hours before you are scheduled to arrive for your surgery. Do not drink anything within 2 hours of the start of your surgery.  Clear liquids include: - water  - apple juice without pulp - gatorade - black coffee or tea (Do NOT add milk or creamers to the coffee or tea) Do NOT drink anything that is not on this list.  No Alcohol for 24 hours before or after surgery.  No Smoking including e-cigarettes for 24 hours prior to surgery.  No chewable tobacco products for at least 6 hours prior to surgery.  No nicotine patches on the day of surgery.  On the morning of surgery brush your teeth with toothpaste and water, you may rinse your mouth with mouthwash if you wish. Do not swallow any toothpaste or mouthwash.  Notify your doctor if there is any change in your medical condition (cold, fever, infection).  Do not wear jewelry, make-up, hairpins, clips or nail polish.  Do not wear lotions, powders, or perfumes.   Do not shave 48 hours prior to surgery.   Contacts and dentures may not be worn into surgery.  Do not bring valuables to the hospital, including drivers license, insurance or credit cards.  [facility] is not responsible for any belongings or valuables.   TAKE THESE MEDICATIONS THE MORNING OF SURGERY:  1.  Gabapentin 2.  Oxycodone (as needed for pain)  Shower using antibacterial Soap the day of  surgery.  Now!  Stop Anti-inflammatories (NSAIDS) such as Advil, Aleve, Ibuprofen, Motrin, Naproxen, Naprosyn and Aspirin based products such as Excedrin, Goodys Powder, BC Powder. (May take Tylenol or Acetaminophen if needed.)  Now!  Stop ANY OVER THE COUNTER supplements until after surgery.  Wear comfortable clothing (specific to your surgery type) to the hospital.  If you are being discharged the day of surgery, you will not be allowed to drive home. You will need a responsible adult to drive you home and stay with you that night.   If you are taking public transportation, you will need to have a responsible adult with you. Please confirm with your physician that it is acceptable to use public transportation.   Please call [phone] if you have any questions about these instructions.

## 2019-01-21 ENCOUNTER — Telehealth: Payer: Self-pay

## 2019-01-21 NOTE — Telephone Encounter (Signed)
Covid negative- mail box full unable to leave message.

## 2019-01-23 ENCOUNTER — Other Ambulatory Visit: Payer: Self-pay

## 2019-01-23 ENCOUNTER — Ambulatory Visit: Payer: Medicaid Other | Admitting: Anesthesiology

## 2019-01-23 ENCOUNTER — Observation Stay
Admission: RE | Admit: 2019-01-23 | Discharge: 2019-01-24 | Disposition: A | Discharge status: Home / Self Care | Payer: Medicaid Other | Attending: Surgery | Admitting: Surgery

## 2019-01-23 ENCOUNTER — Encounter: Payer: Self-pay | Admitting: *Deleted

## 2019-01-23 ENCOUNTER — Encounter
Admission: RE | Disposition: A | Discharge status: Home / Self Care | Payer: Self-pay | Source: Home / Self Care | Attending: Surgery

## 2019-01-23 DIAGNOSIS — C50919 Malignant neoplasm of unspecified site of unspecified female breast: Secondary | ICD-10-CM

## 2019-01-23 DIAGNOSIS — C50811 Malignant neoplasm of overlapping sites of right female breast: Secondary | ICD-10-CM

## 2019-01-23 DIAGNOSIS — C7951 Secondary malignant neoplasm of bone: Secondary | ICD-10-CM | POA: Diagnosis not present

## 2019-01-23 DIAGNOSIS — Z171 Estrogen receptor negative status [ER-]: Secondary | ICD-10-CM | POA: Insufficient documentation

## 2019-01-23 DIAGNOSIS — Z79899 Other long term (current) drug therapy: Secondary | ICD-10-CM | POA: Diagnosis not present

## 2019-01-23 DIAGNOSIS — C773 Secondary and unspecified malignant neoplasm of axilla and upper limb lymph nodes: Secondary | ICD-10-CM | POA: Insufficient documentation

## 2019-01-23 DIAGNOSIS — C50911 Malignant neoplasm of unspecified site of right female breast: Secondary | ICD-10-CM | POA: Diagnosis not present

## 2019-01-23 HISTORY — PX: PRIMARY CLOSURE: SHX6224

## 2019-01-23 HISTORY — PX: SIMPLE MASTECTOMY WITH AXILLARY SENTINEL NODE BIOPSY: SHX6098

## 2019-01-23 LAB — CREATININE, SERUM
Creatinine, Ser: 0.52 mg/dL (ref 0.44–1.00)
GFR calc Af Amer: >60 mL/min (ref >60)
GFR calc non Af Amer: >60 mL/min (ref >60)

## 2019-01-23 LAB — HIV ANTIBODY (ROUTINE TESTING W REFLEX): HIV Screen 4th Generation wRfx: NONREACTIVE

## 2019-01-23 LAB — CBC
HCT: 27.6 % — ABNORMAL LOW (ref 36.0–46.0)
Hemoglobin: 9 g/dL — ABNORMAL LOW (ref 12.0–15.0)
MCH: 25.7 pg — ABNORMAL LOW (ref 26.0–34.0)
MCHC: 32.6 g/dL (ref 30.0–36.0)
MCV: 78.9 fL — ABNORMAL LOW (ref 80.0–100.0)
Platelets: 358 10*3/uL (ref 150–400)
RBC: 3.5 MIL/uL — ABNORMAL LOW (ref 3.87–5.11)
RDW: 23.9 % — ABNORMAL HIGH (ref 11.5–15.5)
WBC: 11.7 10*3/uL — ABNORMAL HIGH (ref 4.0–10.5)
nRBC: 0 % (ref 0.0–0.2)

## 2019-01-23 LAB — POCT PREGNANCY, URINE: Preg Test, Ur: NEGATIVE

## 2019-01-23 SURGERY — SIMPLE MASTECTOMY
Anesthesia: General | Site: Breast | Laterality: Right

## 2019-01-23 MED ORDER — DIPHENHYDRAMINE HCL 50 MG/ML IJ SOLN: 12.5000 mg | Freq: Four times a day (QID) | INTRAMUSCULAR | Status: DC | PRN

## 2019-01-23 MED ORDER — PROPOFOL 10 MG/ML IV BOLUS
INTRAVENOUS | Status: DC | PRN
  Administered 2019-01-23: 90 mg via INTRAVENOUS

## 2019-01-23 MED ORDER — SUCCINYLCHOLINE CHLORIDE 20 MG/ML IJ SOLN
INTRAMUSCULAR | Status: DC | PRN
  Administered 2019-01-23: 100 mg via INTRAVENOUS

## 2019-01-23 MED ORDER — OXYCODONE HCL 5 MG PO TABS: 5.0000 mg | ORAL_TABLET | Freq: Once | ORAL | Status: DC | PRN

## 2019-01-23 MED ORDER — CEFAZOLIN SODIUM-DEXTROSE 2-4 GM/100ML-% IV SOLN
2.0000 g | INTRAVENOUS | Status: AC
  Administered 2019-01-23: 2 g via INTRAVENOUS

## 2019-01-23 MED ORDER — ACETAMINOPHEN 500 MG PO TABS
1000.0000 mg | ORAL_TABLET | Freq: Four times a day (QID) | ORAL | Status: DC
  Administered 2019-01-23 – 2019-01-24 (×4): 1000 mg via ORAL
  Filled 2019-01-23 (×5): qty 2

## 2019-01-23 MED ORDER — LIDOCAINE-EPINEPHRINE 1 %-1:100000 IJ SOLN
INTRAMUSCULAR | Status: AC
  Filled 2019-01-23: qty 1

## 2019-01-23 MED ORDER — DEXAMETHASONE SODIUM PHOSPHATE 10 MG/ML IJ SOLN
INTRAMUSCULAR | Status: DC | PRN
  Administered 2019-01-23: 5 mg via INTRAVENOUS

## 2019-01-23 MED ORDER — CEFAZOLIN SODIUM-DEXTROSE 2-4 GM/100ML-% IV SOLN: 2.0000 g | INTRAVENOUS | Status: DC

## 2019-01-23 MED ORDER — BUPIVACAINE LIPOSOME 1.3 % IJ SUSP
INTRAMUSCULAR | Status: AC
  Filled 2019-01-23: qty 20

## 2019-01-23 MED ORDER — KETOROLAC TROMETHAMINE 30 MG/ML IJ SOLN
INTRAMUSCULAR | Status: DC | PRN
  Administered 2019-01-23: 30 mg via INTRAVENOUS

## 2019-01-23 MED ORDER — ONDANSETRON 4 MG PO TBDP: 4.0000 mg | ORAL_TABLET | Freq: Four times a day (QID) | ORAL | Status: DC | PRN

## 2019-01-23 MED ORDER — FENTANYL CITRATE (PF) 250 MCG/5ML IJ SOLN
INTRAMUSCULAR | Status: AC
  Filled 2019-01-23: qty 5

## 2019-01-23 MED ORDER — KETOROLAC TROMETHAMINE 30 MG/ML IJ SOLN
30.0000 mg | Freq: Four times a day (QID) | INTRAMUSCULAR | Status: DC
  Administered 2019-01-23 – 2019-01-24 (×4): 30 mg via INTRAVENOUS
  Filled 2019-01-23 (×7): qty 1

## 2019-01-23 MED ORDER — OXYCODONE HCL 5 MG PO TABS
5.0000 mg | ORAL_TABLET | ORAL | Status: DC | PRN
  Administered 2019-01-24: 5 mg via ORAL
  Filled 2019-01-23: qty 1
  Filled 2019-01-23: qty 2

## 2019-01-23 MED ORDER — OXYCODONE HCL 5 MG/5ML PO SOLN: 5.0000 mg | Freq: Once | ORAL | Status: DC | PRN

## 2019-01-23 MED ORDER — BUPIVACAINE LIPOSOME 1.3 % IJ SUSP
INTRAMUSCULAR | Status: DC | PRN
  Administered 2019-01-23: 20 mL

## 2019-01-23 MED ORDER — FAMOTIDINE 20 MG PO TABS
ORAL_TABLET | ORAL | Status: AC
  Administered 2019-01-23: 20 mg via ORAL
  Filled 2019-01-23: qty 1

## 2019-01-23 MED ORDER — SUGAMMADEX SODIUM 200 MG/2ML IV SOLN
INTRAVENOUS | Status: DC | PRN
  Administered 2019-01-23: 100 mg via INTRAVENOUS

## 2019-01-23 MED ORDER — SODIUM CHLORIDE 0.9 % IV SOLN
INTRAVENOUS | Status: DC
  Administered 2019-01-23 – 2019-01-24 (×2): via INTRAVENOUS

## 2019-01-23 MED ORDER — BISACODYL 5 MG PO TBEC
5.0000 mg | DELAYED_RELEASE_TABLET | Freq: Every day | ORAL | Status: DC | PRN
  Filled 2019-01-23: qty 1

## 2019-01-23 MED ORDER — ONDANSETRON HCL 4 MG/2ML IJ SOLN: 4.0000 mg | Freq: Four times a day (QID) | INTRAMUSCULAR | Status: DC | PRN

## 2019-01-23 MED ORDER — ONDANSETRON HCL 4 MG/2ML IJ SOLN
INTRAMUSCULAR | Status: DC | PRN
  Administered 2019-01-23: 4 mg via INTRAVENOUS

## 2019-01-23 MED ORDER — FENTANYL CITRATE (PF) 100 MCG/2ML IJ SOLN
25.0000 ug | INTRAMUSCULAR | Status: DC | PRN
  Administered 2019-01-23 (×4): 25 ug via INTRAVENOUS

## 2019-01-23 MED ORDER — BACITRACIN ZINC 500 UNIT/GM EX OINT
TOPICAL_OINTMENT | CUTANEOUS | Status: AC
  Filled 2019-01-23: qty 28.35

## 2019-01-23 MED ORDER — ROCURONIUM BROMIDE 100 MG/10ML IV SOLN: INTRAVENOUS | Status: DC | PRN

## 2019-01-23 MED ORDER — ONDANSETRON HCL 4 MG/2ML IJ SOLN
INTRAMUSCULAR | Status: AC
  Filled 2019-01-23: qty 2

## 2019-01-23 MED ORDER — SEVOFLURANE IN SOLN
RESPIRATORY_TRACT | Status: AC
  Filled 2019-01-23: qty 250

## 2019-01-23 MED ORDER — MIDAZOLAM HCL 2 MG/2ML IJ SOLN
INTRAMUSCULAR | Status: DC | PRN
  Administered 2019-01-23: 2 mg via INTRAVENOUS

## 2019-01-23 MED ORDER — MAGNESIUM CITRATE PO SOLN
1.0000 | Freq: Once | ORAL | Status: DC | PRN
  Filled 2019-01-23: qty 296

## 2019-01-23 MED ORDER — MORPHINE SULFATE (PF) 4 MG/ML IV SOLN: 2.0000 mg | INTRAVENOUS | Status: DC | PRN

## 2019-01-23 MED ORDER — CHLORHEXIDINE GLUCONATE CLOTH 2 % EX PADS
6.0000 | MEDICATED_PAD | Freq: Once | CUTANEOUS | Status: AC
  Administered 2019-01-23: 6 via TOPICAL

## 2019-01-23 MED ORDER — EPINEPHRINE PF 1 MG/ML IJ SOLN
INTRAMUSCULAR | Status: AC
  Filled 2019-01-23: qty 1

## 2019-01-23 MED ORDER — PROPOFOL 10 MG/ML IV BOLUS
INTRAVENOUS | Status: AC
  Filled 2019-01-23: qty 40

## 2019-01-23 MED ORDER — ROCURONIUM BROMIDE 50 MG/5ML IV SOLN
INTRAVENOUS | Status: AC
  Filled 2019-01-23: qty 1

## 2019-01-23 MED ORDER — CHLORHEXIDINE GLUCONATE CLOTH 2 % EX PADS: 6.0000 | MEDICATED_PAD | Freq: Once | CUTANEOUS | Status: DC

## 2019-01-23 MED ORDER — BUPIVACAINE-EPINEPHRINE 0.25% -1:200000 IJ SOLN
INTRAMUSCULAR | Status: DC | PRN
  Administered 2019-01-23: 30 mL

## 2019-01-23 MED ORDER — LACTATED RINGERS IV SOLN
INTRAVENOUS | Status: DC
  Administered 2019-01-23 (×3): via INTRAVENOUS

## 2019-01-23 MED ORDER — FENTANYL CITRATE (PF) 100 MCG/2ML IJ SOLN
INTRAMUSCULAR | Status: DC | PRN
  Administered 2019-01-23: 25 ug via INTRAVENOUS
  Administered 2019-01-23: 50 ug via INTRAVENOUS
  Administered 2019-01-23: 25 ug via INTRAVENOUS
  Administered 2019-01-23: 50 ug via INTRAVENOUS

## 2019-01-23 MED ORDER — MIDAZOLAM HCL 2 MG/2ML IJ SOLN
INTRAMUSCULAR | Status: AC
  Filled 2019-01-23: qty 2

## 2019-01-23 MED ORDER — ROCURONIUM BROMIDE 100 MG/10ML IV SOLN
INTRAVENOUS | Status: DC | PRN
  Administered 2019-01-23: 50 mg via INTRAVENOUS

## 2019-01-23 MED ORDER — FENTANYL CITRATE (PF) 100 MCG/2ML IJ SOLN
INTRAMUSCULAR | Status: AC
  Administered 2019-01-23: 10:00:00 25 ug via INTRAVENOUS
  Filled 2019-01-23: qty 2

## 2019-01-23 MED ORDER — CEFAZOLIN SODIUM-DEXTROSE 2-4 GM/100ML-% IV SOLN
INTRAVENOUS | Status: AC
  Filled 2019-01-23: qty 100

## 2019-01-23 MED ORDER — ENOXAPARIN SODIUM 40 MG/0.4ML ~~LOC~~ SOLN
40.0000 mg | SUBCUTANEOUS | Status: DC
  Filled 2019-01-23 (×2): qty 0.4

## 2019-01-23 MED ORDER — BUPIVACAINE HCL (PF) 0.25 % IJ SOLN
INTRAMUSCULAR | Status: AC
  Filled 2019-01-23: qty 30

## 2019-01-23 MED ORDER — SUGAMMADEX SODIUM 200 MG/2ML IV SOLN
INTRAVENOUS | Status: AC
  Filled 2019-01-23: qty 2

## 2019-01-23 MED ORDER — FAMOTIDINE 20 MG PO TABS
20.0000 mg | ORAL_TABLET | Freq: Once | ORAL | Status: AC
  Administered 2019-01-23: 07:00:00 20 mg via ORAL

## 2019-01-23 MED ORDER — DIPHENHYDRAMINE HCL 12.5 MG/5ML PO ELIX
12.5000 mg | ORAL_SOLUTION | Freq: Four times a day (QID) | ORAL | Status: DC | PRN
  Filled 2019-01-23: qty 5

## 2019-01-23 SURGICAL SUPPLY — 93 items
APPLIER CLIP 9.375 SM OPEN (CLIP) ×4
BINDER BREAST LRG (GAUZE/BANDAGES/DRESSINGS) ×4 IMPLANT
BINDER BREAST MEDIUM (GAUZE/BANDAGES/DRESSINGS) IMPLANT
BINDER BREAST XLRG (GAUZE/BANDAGES/DRESSINGS) IMPLANT
BINDER BREAST XXLRG (GAUZE/BANDAGES/DRESSINGS) IMPLANT
BIOPATCH WHT 1IN DISK W/4.0 H (GAUZE/BANDAGES/DRESSINGS) ×4 IMPLANT
BLADE CLIPPER SURG (BLADE) IMPLANT
BLADE SURG 15 STRL LF DISP TIS (BLADE) ×4 IMPLANT
BLADE SURG 15 STRL SS (BLADE) ×4
BNDG CONFORM 2 STRL LF (GAUZE/BANDAGES/DRESSINGS) IMPLANT
BNDG ELASTIC 2X5.8 VLCR STR LF (GAUZE/BANDAGES/DRESSINGS) IMPLANT
BULB RESERV EVAC DRAIN JP 100C (MISCELLANEOUS) ×4 IMPLANT
CANISTER SUCT 1200ML W/VALVE (MISCELLANEOUS) ×8 IMPLANT
CHLORAPREP W/TINT 26 (MISCELLANEOUS) IMPLANT
CLIP APPLIE 9.375 SM OPEN (CLIP) ×2 IMPLANT
CNTNR SPEC 2.5X3XGRAD LEK (MISCELLANEOUS)
CONT SPEC 4OZ STER OR WHT (MISCELLANEOUS)
CONTAINER SPEC 2.5X3XGRAD LEK (MISCELLANEOUS) IMPLANT
CORD BIP STRL DISP 12FT (MISCELLANEOUS) IMPLANT
COVER BACK TABLE REUSABLE LG (DRAPES) IMPLANT
COVER LIGHT HANDLE STERIS (MISCELLANEOUS) ×4 IMPLANT
COVER MAYO STAND REUSABLE (DRAPES) ×4 IMPLANT
COVER WAND RF STERILE (DRAPES) ×8 IMPLANT
DECANTER SPIKE VIAL GLASS SM (MISCELLANEOUS) IMPLANT
DERMABOND ADVANCED (GAUZE/BANDAGES/DRESSINGS) ×2
DERMABOND ADVANCED .7 DNX12 (GAUZE/BANDAGES/DRESSINGS) ×2 IMPLANT
DRAIN CHANNEL JP 15F RND 16 (MISCELLANEOUS) IMPLANT
DRAIN CHANNEL JP 19F (MISCELLANEOUS) ×4 IMPLANT
DRAPE IMP U-DRAPE 54X76 (DRAPES) IMPLANT
DRAPE INCISE IOBAN 66X45 STRL (DRAPES) IMPLANT
DRAPE LAPAROSCOPIC ABDOMINAL (DRAPES) IMPLANT
DRAPE LAPAROTOMY 100X77 ABD (DRAPES) IMPLANT
DRAPE LAPAROTOMY 77X122 PED (DRAPES) IMPLANT
DRAPE LAPAROTOMY TRNSV 106X77 (MISCELLANEOUS) ×4 IMPLANT
DRAPE SHEET LG 3/4 BI-LAMINATE (DRAPES) ×4 IMPLANT
DRAPE SURG 17X23 STRL (DRAPES) IMPLANT
DRSG EMULSION OIL 3X3 NADH (GAUZE/BANDAGES/DRESSINGS) IMPLANT
DRSG TEGADERM 4X10 (GAUZE/BANDAGES/DRESSINGS) IMPLANT
DRSG TELFA 3X8 NADH (GAUZE/BANDAGES/DRESSINGS) IMPLANT
ELECT CAUTERY BLADE 6.4 (BLADE) ×4 IMPLANT
ELECT REM PT RETURN 9FT ADLT (ELECTROSURGICAL) ×4
ELECTRODE REM PT RTRN 9FT ADLT (ELECTROSURGICAL) ×2 IMPLANT
FORCEPS JEWEL BIP 4-3/4 STR (INSTRUMENTS) IMPLANT
GAUZE SPONGE 4X4 12PLY STRL (GAUZE/BANDAGES/DRESSINGS) IMPLANT
GAUZE XEROFORM 1X8 LF (GAUZE/BANDAGES/DRESSINGS) ×4 IMPLANT
GAUZE XEROFORM 5X9 LF (GAUZE/BANDAGES/DRESSINGS) IMPLANT
GLOVE BIO SURGEON STRL SZ 6.5 (GLOVE) ×9 IMPLANT
GLOVE BIO SURGEON STRL SZ7 (GLOVE) ×16 IMPLANT
GLOVE BIO SURGEONS STRL SZ 6.5 (GLOVE) ×3
GOWN STRL REUS W/ TWL LRG LVL3 (GOWN DISPOSABLE) ×8 IMPLANT
GOWN STRL REUS W/TWL LRG LVL3 (GOWN DISPOSABLE) ×8
KIT TURNOVER KIT A (KITS) ×8 IMPLANT
LABEL OR SOLS (LABEL) ×4 IMPLANT
NDL SAFETY ECLIPSE 18X1.5 (NEEDLE) IMPLANT
NEEDLE HYPO 18GX1.5 SHARP (NEEDLE)
NEEDLE HYPO 22GX1.5 SAFETY (NEEDLE) ×4 IMPLANT
NEEDLE HYPO 25X1 1.5 SAFETY (NEEDLE) IMPLANT
NEEDLE HYPO 27GX1-1/4 (NEEDLE) IMPLANT
NS IRRIG 1000ML POUR BTL (IV SOLUTION) IMPLANT
PACK BASIN MINOR ARMC (MISCELLANEOUS) ×8 IMPLANT
PAD ABD DERMACEA PRESS 5X9 (GAUZE/BANDAGES/DRESSINGS) ×8 IMPLANT
PIN SAFETY STRL (MISCELLANEOUS) ×4 IMPLANT
SLEVE PROBE SENORX GAMMA FIND (MISCELLANEOUS) IMPLANT
SOL PREP PVP 2OZ (MISCELLANEOUS) ×4
SOLUTION PREP PVP 2OZ (MISCELLANEOUS) ×2 IMPLANT
SPONGE LAP 18X18 RF (DISPOSABLE) ×12 IMPLANT
SUCTION FRAZIER HANDLE 10FR (MISCELLANEOUS)
SUCTION TUBE FRAZIER 10FR DISP (MISCELLANEOUS) IMPLANT
SUT ETH BLK MONO 3 0 FS 1 12/B (SUTURE) IMPLANT
SUT MNCRL 3-0 UNDYED SH (SUTURE) ×4 IMPLANT
SUT MNCRL 4-0 (SUTURE) ×6
SUT MNCRL 4-0 27XMFL (SUTURE) ×6
SUT MNCRL 5-0 UNDYED TF 13 (SUTURE) ×4 IMPLANT
SUT MNCRL AB 3-0 PS2 18 (SUTURE) IMPLANT
SUT MNCRL AB 4-0 PS2 18 (SUTURE) IMPLANT
SUT MNCRL+ 5-0 UNDYED PC-3 (SUTURE) IMPLANT
SUT MON AB 2-0 CT1 36 (SUTURE) ×4 IMPLANT
SUT MON AB 5-0 P3 18 (SUTURE) ×4 IMPLANT
SUT MONOCRYL 3-0 UNDYED (SUTURE) ×4
SUT MONOCRYL 5-0 (SUTURE) ×4
SUT SILK 2 0SH CR/8 30 (SUTURE) ×4 IMPLANT
SUT SILK 3 0 PS 1 (SUTURE) IMPLANT
SUT SILK 3 0 SH 30 (SUTURE) ×4 IMPLANT
SUT VIC AB 2-0 CT1 (SUTURE) IMPLANT
SUT VIC AB 2-0 SH 27 (SUTURE) ×4
SUT VIC AB 2-0 SH 27XBRD (SUTURE) ×4 IMPLANT
SUTURE MNCRL 4-0 27XMF (SUTURE) ×6 IMPLANT
SYR 10ML LL (SYRINGE) IMPLANT
SYR 3ML LL SCALE MARK (SYRINGE) IMPLANT
SYR BULB IRRIG 60ML STRL (SYRINGE) ×4 IMPLANT
TOWEL OR 17X26 4PK STRL BLUE (TOWEL DISPOSABLE) ×4 IMPLANT
TRAY PREP VAG/GEN (MISCELLANEOUS) ×4 IMPLANT
WATER STERILE IRR 1000ML POUR (IV SOLUTION) ×4 IMPLANT

## 2019-01-23 NOTE — Op Note (Addendum)
DATE OF OPERATION: 01/23/2019  LOCATION:  [facility] Main Operating Room  PREOPERATIVE DIAGNOSIS: Right breast cancer  POSTOPERATIVE DIAGNOSIS: Same  PROCEDURE: Right Breast Mastectomy Flap complex closure 20 cm  SURGEON: Claire Sanger Dillingham, DO  EBL: 100 cc  CONDITION: Stable  COMPLICATIONS: None  INDICATION: The patient, [name], is a 33 y.o. female born on Nov 10, 1985, is here for treatment of right breast cancer.   PROCEDURE DETAILS:  The patient was seen prior to surgery and marked.  The IV antibiotics were given. The patient was taken to the operating room and given a general anesthetic. A standard time out was performed and all information was confirmed by those in the room. SCDs were placed.     I assisted general surgery during the mastectomy portion of the case.  The resulting defect was 10 x 20 cm.  The breast pocket was irrigated with saline.  The superior breast flap was released over the pectoralis major muscle with undermining to the border of the clavicle.  The medial skin area was released from the chest wall to the edge of the sternum.  Laterally the undermining was taken to the mid axillary line and axilla.  Then using the electrocautery the bottom / inferior flap was released over the rectus muscle for 4 cm.  Hemostasis was achieved with electrocautery.  The pocket was irrigated with saline.  A drain was placed and secured to the chest wall with the 3-0 Silk.  This undermining and release provided movement and advancement of the tissue edges superiorly and inferiorly to join and cover the defect.  The deep layers were closed with the 2-0 and 3-0 Monocryl.  The 4-0 Monocryl was placed and then 5-0 Monocryl with vertical mattress sutures.   A xeroform was applied and an ABD.  A breast binder was placed. The patient was allowed to wake up and taken to recovery room in stable condition at the end of the case. The family was notified at the end of the case.

## 2019-01-23 NOTE — Anesthesia Postprocedure Evaluation (Signed)
Anesthesia Post Note  Patient: [name]  Procedure(s) Performed: SIMPLE MASTECTOMY (Right Breast) CLOSURE OF RIGHT BREAST (Right )  Patient location during evaluation: PACU Anesthesia Type: General Level of consciousness: awake and alert Pain management: pain level controlled Vital Signs Assessment: post-procedure vital signs reviewed and stable Respiratory status: spontaneous breathing, nonlabored ventilation, respiratory function stable and patient connected to nasal cannula oxygen Cardiovascular status: blood pressure returned to baseline and stable Postop Assessment: no apparent nausea or vomiting Anesthetic complications: no     Last Vitals:  Vitals:   01/23/19 1144 01/23/19 1222  BP: (!) 149/93 (!) 128/91  Pulse: 91 89  Resp:    Temp: 36.9 C 37.1 C  SpO2: 99% 99%    Last Pain:  Vitals:   01/23/19 1222  TempSrc: Oral  PainSc:                  Precious Haws Azie Mcconahy

## 2019-01-23 NOTE — Interval H&P Note (Signed)
History and Physical Interval Note:  01/23/2019 6:53 AM  [name]  has presented today for surgery, with the diagnosis of right breast cancer.  The various methods of treatment have been discussed with the patient and family. After consideration of risks, benefits and other options for treatment, the patient has consented to  Procedure(s) with comments: SIMPLE MASTECTOMY (Right) - combined surgery with Dr Baltazar Apo CLOSURE OF RIGHT BREAST (Right) SKIN GRAFT SPLIT THICKNESS (Right) as a surgical intervention.  The patient's history has been reviewed, patient examined, no change in status, stable for surgery.  I have reviewed the patient's chart and labs.  Questions were answered to the patient's satisfaction.     [name] Marytza Grandpre

## 2019-01-23 NOTE — Anesthesia Preprocedure Evaluation (Signed)
Anesthesia Evaluation  Patient identified by MRN, date of birth, ID band Patient awake    Reviewed: Allergy & Precautions, H&P , NPO status , Patient's Chart, lab work & pertinent test results, reviewed documented beta blocker date and time   History of Anesthesia Complications Negative for: history of anesthetic complications  Airway Mallampati: II  TM Distance: >3 FB Neck ROM: full    Dental  (+) Chipped   Pulmonary neg shortness of breath, Current Smoker and Patient abstained from smoking.,           Cardiovascular Exercise Tolerance: Good      Neuro/Psych negative neurological ROS  negative psych ROS   GI/Hepatic negative GI ROS, Neg liver ROS,   Endo/Other  negative endocrine ROS  Renal/GU negative Renal ROS  negative genitourinary   Musculoskeletal   Abdominal   Peds  Hematology  (+) Blood dyscrasia, anemia ,   Anesthesia Other Findings Past Medical History: No date: Anemia No date: Breast cancer [facility])  Past Surgical History: 10/25/2018: BREAST BIOPSY; Right     Comment:  Heart Clip, Pending path 10/25/2018: BREAST BIOPSY; Right     Comment:  Lymph node biopsy, Hydromarker (butterfly), path pending No date: CESAREAN SECTION 04/24/2016: CESAREAN SECTION; N/A     Comment:  Procedure: REPEAT CESAREAN [place];  Surgeon: Brayton Mars, MD;                Location: [facility] ORS;  Service: Obstetrics;  Laterality:               N/A;  Baby Boy born @ 25 Apgars:8/9 Weight: 8lb 9oz 11/05/2018: PORTACATH PLACEMENT; Left     Comment:  Procedure: INSERTION PORT-A-CATH;  Surgeon: Jules Husbands, MD;  Location: [facility] ORS;  Service: General;                Laterality: Left;     Reproductive/Obstetrics negative OB ROS                             Anesthesia Physical  Anesthesia Plan  ASA: II  Anesthesia Plan: General ETT    Post-op Pain Management:    Induction: Intravenous  PONV Risk Score and Plan: Ondansetron, Dexamethasone, Midazolam and Treatment may vary due to age or medical condition  Airway Management Planned: Oral ETT  Additional Equipment:   Intra-op Plan:   Post-operative Plan: Extubation in OR  Informed Consent: I have reviewed the patients History and Physical, chart, labs and discussed the procedure including the risks, benefits and alternatives for the proposed anesthesia with the patient or authorized representative who has indicated his/her understanding and acceptance.     Dental Advisory Given  Plan Discussed with: CRNA  Anesthesia Plan Comments: (Patient consented for risks of anesthesia including but not limited to:  - adverse reactions to medications - damage to teeth, lips or other oral mucosa - sore throat or hoarseness - Damage to heart, brain, lungs or loss of life  Patient voiced understanding.)        Anesthesia Quick Evaluation

## 2019-01-23 NOTE — Transfer of Care (Signed)
Immediate Anesthesia Transfer of Care Note  Patient: [name]  Procedure(s) Performed: SIMPLE MASTECTOMY (Right Breast) CLOSURE OF RIGHT BREAST (Right ) SKIN GRAFT SPLIT THICKNESS (Right )  Patient Location: PACU  Anesthesia Type:General  Level of Consciousness: drowsy  Airway & Oxygen Therapy: Patient Spontanous Breathing and Patient connected to face mask oxygen  Post-op Assessment: Report given to RN and Post -op Vital signs reviewed and stable  Post vital signs: Reviewed and stable  Last Vitals:  Vitals Value Taken Time  BP 152/95 01/23/19 0931  Temp 37.1 C 01/23/19 0931  Pulse 109 01/23/19 0934  Resp 16 01/23/19 0933  SpO2 100 % 01/23/19 0934  Vitals shown include unvalidated device data.  Last Pain:  Vitals:   01/23/19 0636  TempSrc: Temporal  PainSc: 2          Complications: No apparent anesthesia complications

## 2019-01-23 NOTE — H&P (Signed)
Chief Complaint  Patient presents with  . 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 c/o of mild moderate constant pain rigth breast.  I did place a port and she is doing well from that she started chemotherapy already. Main concern is pain and open wound from the right breast.  XRT not indicated since this will worsen the drainage in the wound healing process.  I have discussed the case in detail with Dr. Janese Banks and she has asked me to consider a toilet/palliative mastectomy.  I personally review her PET/CT and there is obvious evidence of chest wall involvement.  She continues to have chronic drainage from the right necrotic wound from her cancer      Past Medical History:  Diagnosis Date  . Anemia   . Breast cancer [facility])          Past Surgical History:  Procedure Laterality Date  . BREAST BIOPSY Right 10/25/2018   Heart Clip, Pending path  . BREAST BIOPSY Right 10/25/2018   Lymph node biopsy, Hydromarker (butterfly), path pending  . CESAREAN SECTION    . CESAREAN SECTION N/A 04/24/2016   Procedure: REPEAT CESAREAN SECTION WITH BILATERAL TUBALIGATION;  Surgeon: Brayton Mars, MD;  Location: [facility] ORS;  Service: Obstetrics;  Laterality: N/A;  Baby Boy born @ 40 Apgars:8/9 Weight: 8lb 9oz  . PORTACATH PLACEMENT Left 11/05/2018   Procedure: INSERTION PORT-A-CATH;  Surgeon: Jules Husbands, MD;  Location: [facility] ORS;  Service: General;  Laterality: Left;         Family History  Problem Relation Age of Onset  . Hypertension Mother   . Stroke Father   . Cancer Maternal Grandmother   . Diabetes Maternal Grandfather   . Heart disease Maternal Grandfather   . Hypertension Maternal Grandfather     Social History:  reports that she has been smoking cigarettes. She has a 1.25 pack-year smoking history. She has never used smokeless  tobacco. She reports that she does not drink alcohol or use drugs.  Allergies: No Known Allergies  Medications reviewed.    ROS Full ROS performed and is otherwise negative other than what is stated in HPI  Physical Exam Vitals signs and nursing note reviewed. Exam conducted with a chaperone present.  Constitutional:      General: She is not in acute distress.    Appearance: Normal appearance. She is normal weight.  Eyes:     General:        Right eye: No discharge.        Left eye: No discharge.  Neck:     Musculoskeletal: Normal range of motion and neck supple. No neck rigidity.  Cardiovascular:     Rate and Rhythm: Normal rate and regular rhythm.  Pulmonary:     Effort: Pulmonary effort is normal. No respiratory distress.     Comments: BREAST: Fixed right breast mass attached to the chest wall.  None mobile.  There is evidence of a 3 x 3 cm wound at 12:00 involving the inframammary fold with some necrotic base.  No evidence of active bleeding. Abdominal:     General: Abdomen is flat. There is no distension.     Tenderness: There is no abdominal tenderness. There is no guarding.  Musculoskeletal: Normal range of motion.  Skin:    General: Skin is warm and dry.     Capillary Refill: Capillary refill takes less  than 2 seconds.  Neurological:     General: No focal deficit present.     Mental Status: She is alert and oriented to person, place, and time.  Psychiatric:        Mood and Affect: Mood normal.        Behavior: Behavior normal.        Thought Content: Thought content normal.        Judgment: Judgment normal.    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  Consideration for total mastectomy is being considered.  My concern is  appropriate coverage of the large chest wall defect that I will create and the ability for a potential flap to take and heal in the setting  of  chest wall invasion.  This is traditionally a very challenging issue.  I have discussed the case in detail with her and also with Dr. Janese Banks.  I do not think that there are good options.  She really wishes to have a mastectomy for toilet purposes There is a chance that we may do a combined  mastectomy with muscle flap at the same time to allow coverage of that defect.  Patient understands that this is a complicated procedure with not necessarily good outcomes and this will be only used for palliation and for wound care purposes.   Caroleen Hamman, MD [facility] General Surgeon

## 2019-01-23 NOTE — Discharge Instructions (Signed)
INSTRUCTIONS FOR AFTER BREAST SURGERY   You are getting ready to undergo breast surgery.  You will likely have some questions about what to expect following your operation.  The following information will help you and your family understand what to expect when you are discharged from the hospital.  Following these guidelines will help ensure a smooth recovery and reduce risks of complications.   Postoperative instructions include information on: diet, wound care, medications and physical activity.  AFTER SURGERY Expect to go home after the procedure.  In some cases, you may need to spend one night in the hospital for observation.  DIET Breast surgery does not require a specific diet.  However, I have to mention that the healthier you eat the better your body can start healing. It is important to increasing your protein intake.  This means limiting the foods with sugar and carbohydrates.  Focus on vegetables and some meat.  If you have any liposuction during your procedure be sure to drink water.  If your urine is bright yellow, then it is concentrated, and you need to drink more water.  As a general rule after surgery, you should have 8 ounces of water every hour while awake.  If you find you are persistently nauseated or unable to take in liquids let us know.  NO TOBACCO USE or EXPOSURE.  This will slow your healing process and increase the risk of a wound.  WOUND CARE If you don't have a drain:  You can shower the day after surgery. Use fragrance free soap.  Dial, [place] and [place] are usually mild on the skin. If you have a drain: You can shower five days after surgery.  Clean with baby wipes until the drain is removed.    If you have steri-strips / tape directly attached to your skin leave them in place. It is OK to get these wet.  No baths, pools or hot tubs for two weeks. We close your incision to leave the smallest and best-looking scar. No ointment or creams on your incisions until given the go  ahead.  Especially not Neosporin (Too many skin reactions with this one).  A few weeks after surgery you can use Mederma and start massaging the scar. We ask you to wear your binder or sports bra for the first 6 weeks around the clock, including while sleeping. This provides added comfort and helps reduce the fluid accumulation at the surgery site.  ACTIVITY No heavy lifting until cleared by the doctor.  This usually means no more than a half-gallon of milk.  It is OK to walk and climb stairs. In fact, moving your legs is very important to decrease your risk of a blood clot.  It will also help keep you from getting deconditioned.  Every 1 to 2 hours get up and walk for 5 minutes. This will help with a quicker recovery back to normal.  Let pain be your guide so you don't do too much.  NO, you cannot do the spring cleaning and don't plan on taking care of anyone else.  This is your time for TLC.  You will be more comfortable if you sleep and rest with your head elevated either with a few pillows under you or in a recliner.  No stomach sleeping for a three months.  WORK Everyone returns to work at different times. As a rough guide, most people take at least 1 - 2 weeks off prior to returning to work. If you need documentation  for your job, bring the forms to your postoperative follow up visit.  DRIVING Arrange for someone to bring you home from the hospital.  You may be able to drive a few days after surgery but not while taking any narcotics or valium.  BOWEL MOVEMENTS Constipation can occur after anesthesia and while taking pain medication.  It is important to stay ahead for your comfort.  We recommend taking Milk of Magnesia (2 tablespoons; twice a day) while taking the pain pills.  SEROMA This is fluid your body tried to put in the surgical site.  This is normal but if it creates tight skinny skin let us know.  It usually decreases in a few weeks.  WHEN TO CALL Call your surgeon's office if any  of the following occur:  Fever 101 degrees F or greater  Excessive bleeding or fluid from the incision site.  Pain that increases over time without aid from the medications  Redness, warmth, or pus draining from incision sites  Persistent nausea or inability to take in liquids  Severe misshapen area that underwent the operation.  Here are some resources:  1. Plastic surgery website: https://www.plasticsurgery.org/for-medical-professionals/education-and-resources/publications/breast-reconstruction-magazine 2. Breast Reconstruction Awareness Campaign:  HotelLives.co.nz 3. Plastic surgery Implant information:  https://www.plasticsurgery.org/patient-safety/breast-implant-safety  [facility] Plastic Surgery Specialist  What is the benefit of having a drain?  During surgery your tissue layers are separated.  This raw surface stimulates your body to fill the space with serous fluid.  This is normal but you don't want that fluid to collect and prevent healing.  A fluid collection can also become infected.  The Jackson-Pratt (JP) drain is used to eliminate this collection of fluid and allow the tissue to heal together.    Jackson-Pratt (JP) bulb    How to care for your drainage and suction unit at home Your drainage catheter will be connected to a collection device. The vacuum caused when the device is compressed allows drainage to collect in the device.     Wash your hands with soap and water before and after touching the system.  Empty the JP drain every 12 hours once you get home from your procedure.  Record the fluid amount on the record sheet included.  Start with stripping the drain tube to push the clots or excess fluid to the bulb.  Do this by pinching the tube with one hand near your skin.  Then with the other hand squeeze the tubing and work it toward the bulb.  This should be done several times a day.  This may collapse the tube which will correct on its own.    Use a  safety pin to attach your collection device to your clothing so there is no tension on the insertion site.    If you have drainage at the skin insertion site, you can apply a gauze dressing and secure it with tape.  If the drain falls out, apply a gauze dressing over the drain insertion site and secure with tape.   To empty the collection device:    Release the stopper on the top of the collection unit (bulb).   Pour contents into a measuring container such as a plastic medicine cup.   Record the day and amount of drainage on the attached sheet.  This should be done at least twice a day.    To compress the Jackson-Pratt Bulb:   Release the stopper at the top of the bulb.  Squeeze the bulb tightly in your fist, squeezing air out  of the bulb.   Replace the stopper while the bulb is compressed.   Be careful not to spill the contents when squeezing the bulb.  The drainage will start bright red and turn to pink and then yellow with time.  IMPORTANT: If the bulb is not squeezed before adding the stopper it will not draw out the fluid.  Care for the JP drain site and your skin daily:   You may shower three days after surgery.  Secure the drain to a ribbon or cloth around your waist while showering so it does not pull out while showering.  Be sure your hands are cleaned with soap and water.  Use a clean wet cotton swab to clean the skin around the drain site.   Use another cotton swab to place Vaseline or antibiotic ointment on the skin around the drain.     Contact your physician if any of the following occur:   The fluid in the bulb becomes cloudy.  Your temperature is greater than 101.4.   The incision opens.  If you have drainage at the skin insertion site, you can apply a gauze dressing and secure it with tape.  If the drain falls out, apply a gauze dressing over the drain insertion site and secure with tape.   You will usually have more drainage when you are active  than while you rest or are asleep. If the drainage increases significantly or is bloody call the physician                             Bring this record with you to each office visit Date  Drainage Volume  Date   Drainage volume

## 2019-01-23 NOTE — Anesthesia Procedure Notes (Signed)
Procedure Name: Intubation Date/Time: 01/23/2019 7:43 AM Performed by: Gentry Fitz, CRNA Pre-anesthesia Checklist: Patient identified, Emergency Drugs available, Suction available and Patient being monitored Patient Re-evaluated:Patient Re-evaluated prior to induction Oxygen Delivery Method: Circle system utilized Preoxygenation: Pre-oxygenation with 100% oxygen Induction Type: Rapid sequence, IV induction and Cricoid Pressure applied Ventilation: Mask ventilation without difficulty Laryngoscope Size: Mac and 4 Grade View: Grade I Tube type: Oral Tube size: 7.0 mm Number of attempts: 1 Airway Equipment and Method: Stylet Placement Confirmation: ETT inserted through vocal cords under direct vision and positive ETCO2 Secured at: 21 cm Tube secured with: Tape Dental Injury: Teeth and Oropharynx as per pre-operative assessment

## 2019-01-23 NOTE — Anesthesia Post-op Follow-up Note (Signed)
Anesthesia QCDR form completed.        

## 2019-01-23 NOTE — Op Note (Signed)
  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 Marla Roe Please note that plastic surgery was required due to the extend of the disease, for their expertise and creation of flap and closure.  EBL :20cc  Findings: Ulcerating Right metastatic breast cancer , fixed to the chest wall with bulky axillary adenopathy. Part of Pectoralis muscle needed to be resected since tumor was invading chest wall.   Procedure Details  The patient was seen again in the Holding Room. The benefits, complications, treatment options, and expected outcomes were discussed with the patient. The risks of bleeding, infection, recurrence of symptoms, failure to resolve symptoms, hematoma, seroma, open wound, cosmetic deformity, and the need for further surgery were discussed.  The patient was taken to Operating Room, identified as [name] and the procedure verified.  A Time Out was held and the above information confirmed.  Prior to the induction of general anesthesia, antibiotic prophylaxis was administered. VTE prophylaxis was in place. Appropriate anesthesia was then administered and tolerated well. The chest was prepped with Chloraprep and draped in the sterile fashion. The patient was positioned in the supine position.   Surgical incision was created incorporating the nipple and the ulcerative mass.  The inferior and superior flaps were developed.  Please note that this breast cancer was fixed to the chest wall.  Were able to develop superior and inferior flaps as well as lateral ones.  We had to excise the pectoralis muscle and shave the tumor of the rib cage because it was  infiltrating.  We were able to excise the tumor.  Our mastectomy margins were sternum medially inframammary fold inferiorly latissimus dorsi laterally.  Please note that she did have bulky axillary adenopathy and I was able to remove the majority of the bulky adenopathy within the axilla.  We identified the axillary vein and we were able to safely dissect the fix adenopathy from the right axillary vein. Given the extent of disease I did not send any pathology for margins because I expect that she does have deep margins as well as positive margins within the axilla.  This was purely for palliation therefore I did not send any unnecessary test. We obtain adequate hemostasis with electrocautery.  Is also note that his this case was only made possible because Dr. Marla Roe from plastic surgery was present.  She then was able to do significant advancement flaps and we were able to close the defect primarily.  Please see her operative report for further details. Also place a 19 Blake drains within the chest wall and secured. The skin was closed with 5-0 Monocryl in a subcuticular fashion and reinforced with multiple 4-0 Monocryl's in a mattress fashion.  Xeroform gauze placed on top of the incision along with ABD pads and a binder. Liposomal Marcaine was used to perform a pectoralis muscle block as well as multiple intercostal muscles from the third to the 6 intercostal space laterally.  Patient was taken to the recovery room in stable condition.   Caroleen Hamman, MD, FACS

## 2019-01-24 ENCOUNTER — Telehealth: Payer: Self-pay | Admitting: *Deleted

## 2019-01-24 ENCOUNTER — Encounter: Payer: Self-pay | Admitting: Surgery

## 2019-01-24 DIAGNOSIS — C50911 Malignant neoplasm of unspecified site of right female breast: Secondary | ICD-10-CM | POA: Diagnosis not present

## 2019-01-24 MED ORDER — OXYCODONE HCL 10 MG PO TABS: 10.0000 mg | ORAL_TABLET | ORAL | 0 refills | Status: DC | PRN

## 2019-01-24 NOTE — Discharge Summary (Signed)
[facility] SURGICAL DISCHARGE SUMMARY  Patient ID: [name] MRN: [id number] DOB/AGE: [birth year] 34 y.o.  Admit date: 01/23/2019 Discharge date: 01/24/2019  Discharge Diagnoses Patient Active Problem List   Diagnosis Date Noted  . Breast cancer [facility]) 11/07/2018    Consultants Plastic Surgery - Dr Marla Roe  Procedures 01/23/2019   Dr Dahlia Byes:  Right palliative Radical mastectomy Right pectoralis block using liposomal Marcaine and multiple 3-6 intercostal nerve blocks  Dr Marla Roe Mastectomy advancement Breast Flap to chest wall defect 10 x 15 cm    HPI: [name] is a 33 y.o. female with right breast invasive mamary carcinoma, triple negative, with bone metastasis with drainage and necrotic right breast wound who presents to [facility] for radical mastectomy and breast flap with Dr Dahlia Byes (General) and Dr Marla Roe (Plastic).   Hospital Course: Informed consent was obtained and documented, and patient underwent uneventful right palliative radical mastectomy, nerve block, and breast flapp creation (Dr Dahlia Byes & Dr Marla Roe, 01/23/2019).  Post-operatively, patient did well and had no major issues. Pain was well controlled. Drain output remained serosanguinous. Advancement of patient's diet and ambulation were well-tolerated. The remainder of patient's hospital course was essentially unremarkable, and discharge planning was initiated accordingly with patient safely able to be discharged home with appropriate discharge instructions, pain control (see below), and outpatient  follow-up after all of her questions were answered to her expressed satisfaction.   Discharge Condition: Good   PLEASE NOTE: Patient typically receives 10 mg Oxycodone HCL through the cancer center however she is out of these and can not get a refill while in the hospital. I will provide her with a short prescription (25 pills) and she is going to call about obtaining a refill. This  way she has adequate pain control post-operatively and her long term pain management can continue to be managed through the cancer center.    Physical Examination:  Constitutional: thin appearing female, NAD Pulmonary: Normal effort, no respiratory distress Skin: Large incision to right chest wall is CDI with dermabond and xeroform gauze. Blake drain in right lateral chest wall/axilla with serosanguinous output.    Allergies as of 01/24/2019   No Known Allergies     Medication List    TAKE these medications   dexamethasone 4 MG tablet Commonly known as: DECADRON Take 2 tablets by mouth once a day on the day after chemotherapy and then take 2 tablets two times a day for 2 days. Take with food.   gabapentin 300 MG capsule Commonly known as: Neurontin Take 1 capsule (300 mg total) by mouth 3 (three) times daily.   lidocaine-prilocaine cream Commonly known as: EMLA Apply 1 application topically as directed. Apply small amount over port site 1 hour before treatments, cover the cream with saran wrap to protect your clothing   LORazepam 0.5 MG tablet Commonly known as: Ativan Take 1 tablet (0.5 mg total) by mouth every 6 (six) hours as needed (Nausea or vomiting).   OLANZapine 10 MG tablet Commonly known as: ZYPREXA Take 1 tablet (10 mg total) by mouth at bedtime. Take a tablet day 2 through day 5 after each chemo treatment   ondansetron 8 MG tablet Commonly known as: Zofran Take 1 tablet (8 mg total) by mouth 2 (two) times daily as needed. Start on the third day after chemotherapy.   ondansetron 4 MG tablet Commonly known as: Zofran Take 1 tablet (4 mg total) by mouth every 8 (eight) hours as needed for nausea or vomiting.  Oxycodone HCl 10 MG Tabs Take 1 tablet (10 mg total) by mouth every 4 (four) hours as needed for up to 15 days.   prochlorperazine 10 MG tablet Commonly known as: COMPAZINE Take 1 tablet (10 mg total) by mouth every 6 (six) hours as needed (Nausea or  vomiting).        Follow-up Information    Dillingham, Loel Lofty, DO In 1 week.   Specialty: Plastic Surgery Contact information: [address] [address] [phone]        Jules Husbands, MD In 2 weeks.   Specialty: General Surgery Contact information: [address] [place] [address] [phone]            Time spent on discharge management including discussion of hospital course, clinical condition, outpatient instructions, prescriptions, and follow up with the patient and members of the medical team: >30 minutes  -- Edison Simon , PA-C [facility]  01/24/2019, 11:16 AM [id number] M-F: 7am - 4pm

## 2019-01-24 NOTE — Telephone Encounter (Signed)
I called and spoke with patient and she said she will wait until Monday to get her refill

## 2019-01-24 NOTE — Plan of Care (Signed)
Patient cleared for discharge.     Education complete. AVS printed. Discharge instructions given. All questions answered for patient clarification.  Prescriptions given, pharmacy verified. Per Edison Simon, PA, it is ok to fill oxycodone prescription. IV removed. Supplies given for emptying and recording JP drainage. Additional gauze given to cover incision. Discharged to home via POV.

## 2019-01-24 NOTE — Telephone Encounter (Signed)
I called the patient also and asked her to come in Monday to see md and then she will talk with her about pain med. Refill and see when she will start chemo back again. She states that she has been severely constipated for 2 months because of the chemo and that is why she has not come here. I told her that we have been calling and her phone goes to voicemail and her voice mail is full and will not allow new messages. I also asked her what she is taking for constipation and she said miralax and the hosp. md d/c with dulcolax and she has had very little bowel movements and her back hurts some much that she does not eat and drink much. I told her that I will pass this message to Janese Banks and see if there is anything else pt can take to help with constipation. Hassan Rowan later called the pharmacy and they pt can get pain med but medicaid will not pay for it and pt will wait til Monday and take tylenol for pain per Hassan Rowan. I gave pt appt 2:45 on Monday afternoon 11/23. She was agreeable to coming

## 2019-01-24 NOTE — Telephone Encounter (Signed)
I spoke with pharmacy and was told that it cannot be filled until 11/23, but if we call them and tell them it is alright to fill it early, they will refill it, but she will have to pay out of pocket for it because she is on MCD, unless the directions are changed.

## 2019-01-24 NOTE — Telephone Encounter (Signed)
Patient called and reports that she is out of pain medicine at home (she should have enough from previous prescription written 01/13/19 # 90 unitl 11/23) She was given prescription by surgeon, but pharmacy refusing to refill it and said that Dr Janese Banks would have to refill it. I asked her if she was taking more than prescribed and she said no, but then said she was taking it for back pain and constipation. Please advise

## 2019-01-27 ENCOUNTER — Encounter: Payer: Self-pay | Admitting: *Deleted

## 2019-01-27 ENCOUNTER — Other Ambulatory Visit: Payer: Self-pay

## 2019-01-27 ENCOUNTER — Inpatient Hospital Stay
Admission: EM | Admit: 2019-01-27 | Discharge: 2019-01-29 | DRG: 543 | Disposition: A | Discharge status: Home / Self Care | Payer: Medicaid Other | Attending: Internal Medicine | Admitting: Internal Medicine

## 2019-01-27 ENCOUNTER — Emergency Department: Payer: Medicaid Other

## 2019-01-27 ENCOUNTER — Inpatient Hospital Stay: Payer: Medicaid Other | Attending: Oncology | Admitting: Oncology

## 2019-01-27 DIAGNOSIS — E876 Hypokalemia: Secondary | ICD-10-CM | POA: Diagnosis present

## 2019-01-27 DIAGNOSIS — Z9011 Acquired absence of right breast and nipple: Secondary | ICD-10-CM

## 2019-01-27 DIAGNOSIS — C50919 Malignant neoplasm of unspecified site of unspecified female breast: Secondary | ICD-10-CM

## 2019-01-27 DIAGNOSIS — K5903 Drug induced constipation: Secondary | ICD-10-CM | POA: Diagnosis present

## 2019-01-27 DIAGNOSIS — C779 Secondary and unspecified malignant neoplasm of lymph node, unspecified: Secondary | ICD-10-CM | POA: Diagnosis present

## 2019-01-27 DIAGNOSIS — T451X5A Adverse effect of antineoplastic and immunosuppressive drugs, initial encounter: Secondary | ICD-10-CM | POA: Diagnosis present

## 2019-01-27 DIAGNOSIS — C50911 Malignant neoplasm of unspecified site of right female breast: Secondary | ICD-10-CM | POA: Diagnosis present

## 2019-01-27 DIAGNOSIS — Z95828 Presence of other vascular implants and grafts: Secondary | ICD-10-CM

## 2019-01-27 DIAGNOSIS — C7951 Secondary malignant neoplasm of bone: Principal | ICD-10-CM | POA: Diagnosis present

## 2019-01-27 DIAGNOSIS — Z20828 Contact with and (suspected) exposure to other viral communicable diseases: Secondary | ICD-10-CM | POA: Diagnosis present

## 2019-01-27 DIAGNOSIS — Z9221 Personal history of antineoplastic chemotherapy: Secondary | ICD-10-CM

## 2019-01-27 DIAGNOSIS — D6481 Anemia due to antineoplastic chemotherapy: Secondary | ICD-10-CM | POA: Diagnosis present

## 2019-01-27 DIAGNOSIS — F17210 Nicotine dependence, cigarettes, uncomplicated: Secondary | ICD-10-CM | POA: Diagnosis present

## 2019-01-27 DIAGNOSIS — R0600 Dyspnea, unspecified: Secondary | ICD-10-CM

## 2019-01-27 DIAGNOSIS — M549 Dorsalgia, unspecified: Secondary | ICD-10-CM | POA: Diagnosis present

## 2019-01-27 DIAGNOSIS — Z98891 History of uterine scar from previous surgery: Secondary | ICD-10-CM

## 2019-01-27 DIAGNOSIS — C787 Secondary malignant neoplasm of liver and intrahepatic bile duct: Secondary | ICD-10-CM | POA: Diagnosis present

## 2019-01-27 DIAGNOSIS — Z171 Estrogen receptor negative status [ER-]: Secondary | ICD-10-CM

## 2019-01-27 DIAGNOSIS — K5900 Constipation, unspecified: Secondary | ICD-10-CM

## 2019-01-27 DIAGNOSIS — Z9851 Tubal ligation status: Secondary | ICD-10-CM

## 2019-01-27 DIAGNOSIS — J910 Malignant pleural effusion: Secondary | ICD-10-CM | POA: Diagnosis present

## 2019-01-27 DIAGNOSIS — T402X5A Adverse effect of other opioids, initial encounter: Secondary | ICD-10-CM | POA: Diagnosis present

## 2019-01-27 DIAGNOSIS — G893 Neoplasm related pain (acute) (chronic): Secondary | ICD-10-CM | POA: Diagnosis present

## 2019-01-27 LAB — CBC WITH DIFFERENTIAL/PLATELET
Abs Immature Granulocytes: 0.85 10*3/uL — ABNORMAL HIGH (ref 0.00–0.07)
Basophils Absolute: 0.1 10*3/uL (ref 0.0–0.1)
Basophils Relative: 0 %
Eosinophils Absolute: 0.2 10*3/uL (ref 0.0–0.5)
Eosinophils Relative: 2 %
HCT: 24 % — ABNORMAL LOW (ref 36.0–46.0)
Hemoglobin: 7.8 g/dL — ABNORMAL LOW (ref 12.0–15.0)
Immature Granulocytes: 6 %
Lymphocytes Relative: 12 %
Lymphs Abs: 1.7 10*3/uL (ref 0.7–4.0)
MCH: 25.6 pg — ABNORMAL LOW (ref 26.0–34.0)
MCHC: 32.5 g/dL (ref 30.0–36.0)
MCV: 78.7 fL — ABNORMAL LOW (ref 80.0–100.0)
Monocytes Absolute: 1 10*3/uL (ref 0.1–1.0)
Monocytes Relative: 7 %
Neutro Abs: 10.4 10*3/uL — ABNORMAL HIGH (ref 1.7–7.7)
Neutrophils Relative %: 73 %
Platelets: 462 10*3/uL — ABNORMAL HIGH (ref 150–400)
RBC: 3.05 MIL/uL — ABNORMAL LOW (ref 3.87–5.11)
RDW: 23.7 % — ABNORMAL HIGH (ref 11.5–15.5)
WBC: 14.3 10*3/uL — ABNORMAL HIGH (ref 4.0–10.5)
nRBC: 0.8 % — ABNORMAL HIGH (ref 0.0–0.2)

## 2019-01-27 LAB — COMPREHENSIVE METABOLIC PANEL
ALT: 33 U/L (ref 0–44)
AST: 46 U/L — ABNORMAL HIGH (ref 15–41)
Albumin: 2.8 g/dL — ABNORMAL LOW (ref 3.5–5.0)
Alkaline Phosphatase: 161 U/L — ABNORMAL HIGH (ref 38–126)
Anion gap: 14 (ref 5–15)
BUN: 8 mg/dL (ref 6–20)
CO2: 26 mmol/L (ref 22–32)
Calcium: 8.9 mg/dL (ref 8.9–10.3)
Chloride: 98 mmol/L (ref 98–111)
Creatinine, Ser: 0.64 mg/dL (ref 0.44–1.00)
GFR calc Af Amer: >60 mL/min (ref >60)
GFR calc non Af Amer: >60 mL/min (ref >60)
Glucose, Bld: 107 mg/dL — ABNORMAL HIGH (ref 70–99)
Potassium: 3.4 mmol/L — ABNORMAL LOW (ref 3.5–5.1)
Sodium: 138 mmol/L (ref 135–145)
Total Bilirubin: 0.3 mg/dL (ref 0.3–1.2)
Total Protein: 6.6 g/dL (ref 6.5–8.1)

## 2019-01-27 MED ORDER — ONDANSETRON 4 MG PO TBDP
4.0000 mg | ORAL_TABLET | Freq: Once | ORAL | Status: AC
  Administered 2019-01-27: 22:00:00 4 mg via ORAL
  Filled 2019-01-27: qty 1

## 2019-01-27 MED ORDER — MORPHINE SULFATE (PF) 4 MG/ML IV SOLN
4.0000 mg | Freq: Once | INTRAVENOUS | Status: AC
  Administered 2019-01-27: 23:00:00 4 mg via INTRAVENOUS
  Filled 2019-01-27: qty 1

## 2019-01-27 NOTE — Progress Notes (Unsigned)
Patient pre screened for office appointment, no questions or concerns today. Patient reminded of upcoming appointment time and date. 

## 2019-01-27 NOTE — ED Triage Notes (Signed)
Pt to ED reporting worsening upper right back and rib pain today. Pt has been having a full balloon from her mastectomy drain everyday since procedure on the 19th and is concerned because she has had very little drainage today. Wound was draining appropriately in the morning but throughout the afternoon has had no drainage. Wound site is not red or irritated but pt has been able to manage pain with 1000mg  of tylenol and today that has not been helping the pain. Pt appears very uncomfortable in triage. No SOB noted.

## 2019-01-27 NOTE — ED Notes (Signed)
Patient says she usually empties her drain multiple times a day, has some drainage in JP drain currently but says the amount in there is from all day today. Patient clearly having pain

## 2019-01-27 NOTE — ED Provider Notes (Signed)
[facility] Emergency Department Provider Note  ____________________________________________   First MD Initiated Contact with Patient 01/27/19 2141     (approximate)  I have reviewed the triage vital signs and the nursing notes.   HISTORY  Chief Complaint Back Pain and possible clogged drain    HPI [name] is a 33 y.o. female  With h/o metastatic breast ca here with chest and upper back pain. Pt recently underwent mastectomy with Dr. Dahlia Byes this week, has been recovering well until the last day. Over the last 24 hours, she has developed what she describes as a constant, aching, intermittently sharp upper back and chest pain, primarily along the upper thoracic midline back but also involving the bilateral upper posterior chest wall. She has had associated cough and SOB. No fever. No hemoptysis. No abdominal pain, nausea, vomiting, or diarrhea. SHe feels like the JP drain output is decreased.        Past Medical History:  Diagnosis Date   Anemia    Breast cancer [facility])     Patient Active Problem List   Diagnosis Date Noted   Intractable back pain 01/28/2019   Goals of care, counseling/discussion 11/08/2018   Breast cancer ([place]) 11/07/2018   Iron deficiency anemia 11/05/2018   Anemia 04/28/2016   Status post tubal ligation 04/28/2016   Status post repeat low transverse cesarean section 04/24/2016   Back pain 01/17/2016    Past Surgical History:  Procedure Laterality Date   BREAST BIOPSY Right 10/25/2018   Heart Clip, Pending path   BREAST BIOPSY Right 10/25/2018   Lymph node biopsy, Hydromarker (butterfly), path pending   CESAREAN SECTION     CESAREAN SECTION N/A 04/24/2016   Procedure: REPEAT CESAREAN SECTION WITH BILATERAL TUBALIGATION;  Surgeon: Brayton Mars, MD;  Location: [facility] ORS;  Service: Obstetrics;  Laterality: N/A;  Baby Boy born @ 77 Apgars:8/9 Weight: 8lb 9oz   PORTACATH PLACEMENT Left 11/05/2018     Procedure: INSERTION PORT-A-CATH;  Surgeon: Jules Husbands, MD;  Location: [facility] ORS;  Service: General;  Laterality: Left;   PRIMARY CLOSURE Right 01/23/2019   Procedure: CLOSURE OF RIGHT BREAST;  Surgeon: Wallace Going, DO;  Location: [facility] ORS;  Service: Plastics;  Laterality: Right;   SIMPLE MASTECTOMY WITH AXILLARY SENTINEL NODE BIOPSY Right 01/23/2019   Procedure: SIMPLE MASTECTOMY;  Surgeon: Jules Husbands, MD;  Location: [facility] ORS;  Service: General;  Laterality: Right;  combined surgery with Dr Baltazar Apo    Prior to Admission medications   Medication Sig Start Date End Date Taking? Authorizing Provider  dexamethasone (DECADRON) 4 MG tablet Take 2 tablets by mouth once a day on the day after chemotherapy and then take 2 tablets two times a day for 2 days. Take with food. 11/07/18  Yes Sindy Guadeloupe, MD  gabapentin (NEURONTIN) 300 MG capsule Take 1 capsule (300 mg total) by mouth 3 (three) times daily. 11/04/18  Yes Pabon, [place], MD  lidocaine-prilocaine (EMLA) cream Apply 1 application topically as directed. Apply small amount over port site 1 hour before treatments, cover the cream with saran wrap to protect your clothing 01/13/19  Yes Sindy Guadeloupe, MD  LORazepam (ATIVAN) 0.5 MG tablet Take 1 tablet (0.5 mg total) by mouth every 6 (six) hours as needed (Nausea or vomiting). 11/07/18  Yes Sindy Guadeloupe, MD  OLANZapine (ZYPREXA) 10 MG tablet Take 1 tablet (10 mg total) by mouth at bedtime. Take a tablet day 2 through day 5 after each  chemo treatment 12/03/18  Yes Sindy Guadeloupe, MD  ondansetron (ZOFRAN) 8 MG tablet Take 1 tablet (8 mg total) by mouth 2 (two) times daily as needed. Start on the third day after chemotherapy. 11/07/18  Yes Sindy Guadeloupe, MD  Oxycodone HCl 10 MG TABS Take 1 tablet (10 mg total) by mouth every 4 (four) hours as needed for up to 15 days. 01/24/19 02/08/19 Yes Tylene Fantasia, PA-C  prochlorperazine (COMPAZINE) 10 MG tablet Take 1 tablet (10 mg total) by  mouth every 6 (six) hours as needed (Nausea or vomiting). 11/07/18  Yes Sindy Guadeloupe, MD    Allergies Patient has no known allergies.  Family History  Problem Relation Age of Onset   Hypertension Mother    Stroke Father    Cancer Maternal Grandmother    Diabetes Maternal Grandfather    Heart disease Maternal Grandfather    Hypertension Maternal Grandfather     Social History Social History   Tobacco Use   Smoking status: Current Every Day Smoker    Packs/day: 0.25    Years: 5.00    Pack years: 1.25    Types: Cigarettes   Smokeless tobacco: Never Used  Substance Use Topics   Alcohol use: No   Drug use: No    Review of Systems  Review of Systems  Constitutional: Negative for fatigue and fever.  HENT: Negative for congestion and sore throat.   Eyes: Negative for visual disturbance.  Respiratory: Positive for chest tightness and shortness of breath. Negative for cough.   Cardiovascular: Positive for chest pain.  Gastrointestinal: Negative for abdominal pain, diarrhea, nausea and vomiting.  Genitourinary: Negative for flank pain.  Musculoskeletal: Negative for back pain and neck pain.  Skin: Negative for rash and wound.  Neurological: Negative for weakness.  All other systems reviewed and are negative.    ____________________________________________  PHYSICAL EXAM:      VITAL SIGNS: ED Triage Vitals  Enc Vitals Group     BP 01/27/19 2121 (!) 152/94     Pulse Rate 01/27/19 2121 92     Resp 01/27/19 2121 16     Temp 01/27/19 2121 98.4 F (36.9 C)     Temp Source 01/27/19 2121 Oral     SpO2 01/27/19 2121 99 %     Weight 01/27/19 2117 120 lb 4.8 oz (54.6 kg)     Height 01/27/19 2117 5\' 5"  (1.651 m)     Head Circumference --      Peak Flow --      Pain Score 01/27/19 2117 9     Pain Loc --      Pain Edu? --      Excl. in [place]? --      Physical Exam Vitals signs and nursing note reviewed.  Constitutional:      General: She is not in acute  distress.    Appearance: She is well-developed.  HENT:     Head: Normocephalic and atraumatic.     Mouth/Throat:     Mouth: Mucous membranes are dry.  Eyes:     Conjunctiva/sclera: Conjunctivae normal.  Neck:     Musculoskeletal: Neck supple.  Cardiovascular:     Rate and Rhythm: Normal rate and regular rhythm.     Heart sounds: Normal heart sounds. No murmur. No friction rub.  Pulmonary:     Effort: Pulmonary effort is normal. Tachypnea present. No respiratory distress.     Breath sounds: Decreased air movement present. Examination of the left-middle  field reveals decreased breath sounds. Examination of the right-lower field reveals decreased breath sounds. Examination of the left-lower field reveals decreased breath sounds. Decreased breath sounds and rales present. No wheezing.     Comments: Right anterior chest wall surgical site c/d/i. Minimal erythema noted to lateral aspect of wound. Inferior JP drain with serosanguinous drainage. Abdominal:     General: There is no distension.     Palpations: Abdomen is soft.     Tenderness: There is no abdominal tenderness.  Skin:    General: Skin is warm.     Capillary Refill: Capillary refill takes less than 2 seconds.  Neurological:     Mental Status: She is alert and oriented to person, place, and time.     Motor: No abnormal muscle tone.       ____________________________________________   LABS (all labs ordered are listed, but only abnormal results are displayed)  Labs Reviewed  CBC WITH DIFFERENTIAL/PLATELET - Abnormal; Notable for the following components:      Result Value   WBC 14.3 (*)    RBC 3.05 (*)    Hemoglobin 7.8 (*)    HCT 24.0 (*)    MCV 78.7 (*)    MCH 25.6 (*)    RDW 23.7 (*)    Platelets 462 (*)    nRBC 0.8 (*)    Neutro Abs 10.4 (*)    Abs Immature Granulocytes 0.85 (*)    All other components within normal limits  COMPREHENSIVE METABOLIC PANEL - Abnormal; Notable for the following components:    Potassium 3.4 (*)    Glucose, Bld 107 (*)    Albumin 2.8 (*)    AST 46 (*)    Alkaline Phosphatase 161 (*)    All other components within normal limits  SARS CORONAVIRUS 2 (TAT 6-24 HRS)  BASIC METABOLIC PANEL  CBC  MAGNESIUM    ____________________________________________   ________________________________________  RADIOLOGY All imaging, including plain films, CT scans, and ultrasounds, independently reviewed by me, and interpretations confirmed via formal radiology reads.  ED MD interpretation:   CXR: Bilateral plerual effusions, R>L, atelectasis vs infiltrate, perihilar opacities  Official radiology report(s): Dg Chest 2 View  Result Date: 01/27/2019 CLINICAL DATA:  Right-sided chest pain status post mastectomy EXAM: CHEST - 2 VIEW COMPARISON:  11/05/2018 FINDINGS: Left-sided central venous catheter with tip over the proximal right atrium. Drainage catheter over the right inframammary region. Small right greater than left pleural effusion. Mild airspace disease at the right base. Bilateral hilar fullness, possible nodes. Perihilar interstitial opacity. No pneumothorax. IMPRESSION: 1. Small bilateral pleural effusions, right greater than left with atelectasis or mild infiltrate at the right base. 2. Increased perihilar interstitial opacity which may reflect atypical infection, less likely edema, or possible interstitial spread of disease. 3. Bilateral hilar fullness, cannot exclude hilar nodes. Electronically Signed   By: Donavan Foil M.D.   On: 01/27/2019 22:15   Ct Angio Chest Pe W And/or Wo Contrast  Result Date: 01/28/2019 CLINICAL DATA:  Right upper back and rib pain, increasing drainage from mastectomy site, procedure performed 01/23/2019 EXAM: CT ANGIOGRAPHY CHEST WITH CONTRAST TECHNIQUE: Multidetector CT imaging of the chest was performed using the standard protocol during bolus administration of intravenous contrast. Multiplanar CT image reconstructions and MIPs were  obtained to evaluate the vascular anatomy. CONTRAST:  10mL OMNIPAQUE IOHEXOL 350 MG/ML SOLN COMPARISON:  PET CT November 04, 2018 FINDINGS: Cardiovascular: Satisfactory opacification the pulmonary arteries to the segmental level. No pulmonary artery filling defects are identified.  Central pulmonary arteries are normal caliber. No elevation of the RV/LV ratio (0.7). Left subclavian approach Port-A-Cath tip terminates at the cavoatrial junction. Normal cardiac size. Trace pericardial fluid. Thoracic aorta is normal caliber. Normal 3 vessel branching of the aortic arch. No luminal irregularity is seen. Mediastinum/Nodes: There is increasing bulky mediastinal, hilar and axillary adenopathy, much of which was previously FDG avid on comparison PET-CT. Reference lymph nodes include a right axillary lymph node measuring 18 mm, previously 14 mm (4/34). A 13 mm left axillary lymph node not clearly evident on comparison study (4/29). A 15 mm right hilar lymph node, measuring approximately 12 mm previously (4/44). And a 12 mm AP window lymph node, not clearly present on comparison (4/37). No acute abnormality of the trachea or esophagus. Lungs/Pleura: There is a moderate right pleural effusion and trace left effusion. No abnormal pleural thickening or enhancement adjacent areas of passive atelectasis are noted. Some interlobular septal thickening is present as well. There are several scattered subpleural nodules, largest measuring up to 6 mm in size (6/60). Upper Abdomen: Numerous hypoattenuating nodules throughout the liver, not clearly evident on comparison PET-CT. Musculoskeletal: There are postsurgical changes from recent right mastectomy and right axillary nodal dissection with surgical clips. There is soft tissue gas and stranding adjacent the right pectoralis musculature with a a surgical soft tissue drain coursing from the right chest wall and terminating just to the left of midline. Axillary fluid in stranding is noted  as well (4/49). There is some abnormal hypoattenuation in the right pectoralis musculature (4/56) nonspecific given recent postoperative state but an intramuscular hematoma, abscess or breast mass is not excluded. Review of the MIP images confirms the above findings. IMPRESSION: 1. No evidence of pulmonary embolism. 2. Postsurgical changes from recent right mastectomy and right axillary nodal dissection with soft tissue gas and stranding adjacent to the right pectoralis musculature with a surgical soft tissue drain coursing from the right chest wall and terminating just to the left of midline. There is some abnormal hypoattenuation in the medial right pectoralis musculature, nonspecific given recent postoperative state and proximity to the primary breast lesion, but an intramuscular hematoma, abscess or breast mass is not excluded. 3. Numerous hypoattenuating nodules throughout the liver, not clearly evident on comparison PET-CT, concerning for metastatic disease. 4. Increased bulky mediastinal, hilar and axillary adenopathy, much of which was previously FDG avid on comparison PET-CT, concerning for metastatic disease. 5. Few rounded subpleural nodule suspicious for further metastatic disease are present as well. 6. The moderate right and trace left pleural effusions. Malignant effusion is highly likely though no pleural thickening or enhancement is seen. These results were called by telephone at the time of interpretation on 01/28/2019 at 12:36 am to provider Dr Alfred Levins , who verbally acknowledged these results. Electronically Signed   By: Lovena Le M.D.   On: 01/28/2019 00:37    ____________________________________________  PROCEDURES   Procedure(s) performed (including Critical Care):  Procedures  ____________________________________________  INITIAL IMPRESSION / MDM / [place] / ED COURSE  As part of my medical decision making, I reviewed the following data within the [place] notes reviewed and incorporated, Old chart reviewed, Notes from prior ED visits, and [place] [employer]rolled Sub[employer] [place] was evaluated in Emergency Department on 01/28/2019 for the symptoms described in the history of present illness. She was evaluated in the context of the global COVID-19 pandemic, which necessitated consideration that the patient  might be at risk for infection with the SARS-CoV-2 virus that causes COVID-19. Institutional protocols and algorithms that pertain to the evaluation of patients at risk for COVID-19 are in a state of rapid change based on information released by regulatory bodies including the CDC and federal and state organizations. These policies and algorithms were followed during the patient's care in the ED.  Some ED evaluations and interventions may be delayed as a result of limited staffing during the pandemic.*     Medical Decision Making:  33 yo F with h/o malignant breast cancer here with back pain after recent radical mastectomy. On exam, the surgical wound appears intact with no apparent infection. Pt's lungs, however, are markedly diminished with concern for PNA, atelectasis, malignant effusion, also must consider PE. Will check labs, CT, and reassess. Signed out to Dr. Alfred Levins.   ____________________________________________  FINAL CLINICAL IMPRESSION(S) / ED DIAGNOSES  Final diagnoses:  Metastatic breast cancer ([place])  Cancer associated pain  Malignant pleural effusion     MEDICATIONS GIVEN DURING THIS VISIT:  Medications  Oxycodone HCl TABS 10 mg (has no administration in time range)  LORazepam (ATIVAN) tablet 0.5 mg (has no administration in time range)  OLANZapine (ZYPREXA) tablet 10 mg (has no administration in time range)  prochlorperazine (COMPAZINE) tablet 10 mg (has no administration in time range)  dexamethasone (DECADRON) tablet 4 mg (has no administration in time range)  ondansetron  (ZOFRAN) tablet 8 mg (has no administration in time range)  gabapentin (NEURONTIN) capsule 300 mg (has no administration in time range)  lidocaine-prilocaine (EMLA) cream 1 application (has no administration in time range)  enoxaparin (LOVENOX) injection 40 mg (has no administration in time range)  0.9 %  sodium chloride infusion (has no administration in time range)  acetaminophen (TYLENOL) tablet 650 mg (has no administration in time range)    Or  acetaminophen (TYLENOL) suppository 650 mg (has no administration in time range)  traZODone (DESYREL) tablet 25 mg (has no administration in time range)  magnesium hydroxide (MILK OF MAGNESIA) suspension 30 mL (has no administration in time range)  ondansetron (ZOFRAN) tablet 4 mg (has no administration in time range)    Or  ondansetron (ZOFRAN) injection 4 mg (has no administration in time range)  ketorolac (TORADOL) 30 MG/ML injection 30 mg (has no administration in time range)  morphine 2 MG/ML injection 2 mg (has no administration in time range)  potassium chloride (KLOR-CON) packet 40 mEq (has no administration in time range)  ondansetron (ZOFRAN-ODT) disintegrating tablet 4 mg (4 mg Oral Given 01/27/19 2156)  morphine 4 MG/ML injection 4 mg (4 mg Intravenous Given 01/27/19 2302)  iohexol (OMNIPAQUE) 350 MG/ML injection 75 mL (75 mLs Intravenous Contrast Given 01/28/19 0004)  morphine 4 MG/ML injection 4 mg (4 mg Intravenous Given 01/28/19 0130)     ED Discharge Orders    None       Note:  This document was prepared using Dragon voice recognition software and may include unintentional dictation errors.   Duffy Bruce, MD 01/28/19 [phone]

## 2019-01-28 ENCOUNTER — Inpatient Hospital Stay: Payer: Medicaid Other | Admitting: Oncology

## 2019-01-28 ENCOUNTER — Inpatient Hospital Stay: Payer: Medicaid Other

## 2019-01-28 ENCOUNTER — Telehealth: Payer: Self-pay

## 2019-01-28 ENCOUNTER — Other Ambulatory Visit: Payer: Self-pay | Admitting: *Deleted

## 2019-01-28 ENCOUNTER — Ambulatory Visit: Payer: Medicaid Other | Admitting: Oncology

## 2019-01-28 DIAGNOSIS — T402X5A Adverse effect of other opioids, initial encounter: Secondary | ICD-10-CM | POA: Diagnosis present

## 2019-01-28 DIAGNOSIS — C7951 Secondary malignant neoplasm of bone: Principal | ICD-10-CM

## 2019-01-28 DIAGNOSIS — D6481 Anemia due to antineoplastic chemotherapy: Secondary | ICD-10-CM | POA: Diagnosis present

## 2019-01-28 DIAGNOSIS — J910 Malignant pleural effusion: Secondary | ICD-10-CM | POA: Diagnosis present

## 2019-01-28 DIAGNOSIS — Z171 Estrogen receptor negative status [ER-]: Secondary | ICD-10-CM | POA: Diagnosis not present

## 2019-01-28 DIAGNOSIS — M546 Pain in thoracic spine: Secondary | ICD-10-CM | POA: Diagnosis not present

## 2019-01-28 DIAGNOSIS — R0600 Dyspnea, unspecified: Secondary | ICD-10-CM | POA: Diagnosis not present

## 2019-01-28 DIAGNOSIS — F17210 Nicotine dependence, cigarettes, uncomplicated: Secondary | ICD-10-CM

## 2019-01-28 DIAGNOSIS — G893 Neoplasm related pain (acute) (chronic): Secondary | ICD-10-CM | POA: Insufficient documentation

## 2019-01-28 DIAGNOSIS — Z95828 Presence of other vascular implants and grafts: Secondary | ICD-10-CM | POA: Diagnosis not present

## 2019-01-28 DIAGNOSIS — Z9221 Personal history of antineoplastic chemotherapy: Secondary | ICD-10-CM | POA: Diagnosis not present

## 2019-01-28 DIAGNOSIS — D509 Iron deficiency anemia, unspecified: Secondary | ICD-10-CM

## 2019-01-28 DIAGNOSIS — T451X5A Adverse effect of antineoplastic and immunosuppressive drugs, initial encounter: Secondary | ICD-10-CM | POA: Diagnosis present

## 2019-01-28 DIAGNOSIS — Z170 Estrogen receptor positive status [ER+]: Secondary | ICD-10-CM | POA: Diagnosis not present

## 2019-01-28 DIAGNOSIS — Z9013 Acquired absence of bilateral breasts and nipples: Secondary | ICD-10-CM

## 2019-01-28 DIAGNOSIS — C787 Secondary malignant neoplasm of liver and intrahepatic bile duct: Secondary | ICD-10-CM

## 2019-01-28 DIAGNOSIS — E876 Hypokalemia: Secondary | ICD-10-CM

## 2019-01-28 DIAGNOSIS — C50919 Malignant neoplasm of unspecified site of unspecified female breast: Secondary | ICD-10-CM | POA: Diagnosis not present

## 2019-01-28 DIAGNOSIS — K5900 Constipation, unspecified: Secondary | ICD-10-CM

## 2019-01-28 DIAGNOSIS — C779 Secondary and unspecified malignant neoplasm of lymph node, unspecified: Secondary | ICD-10-CM

## 2019-01-28 DIAGNOSIS — Z20828 Contact with and (suspected) exposure to other viral communicable diseases: Secondary | ICD-10-CM | POA: Diagnosis present

## 2019-01-28 DIAGNOSIS — Z98891 History of uterine scar from previous surgery: Secondary | ICD-10-CM | POA: Diagnosis not present

## 2019-01-28 DIAGNOSIS — M549 Dorsalgia, unspecified: Secondary | ICD-10-CM | POA: Diagnosis not present

## 2019-01-28 DIAGNOSIS — Z9011 Acquired absence of right breast and nipple: Secondary | ICD-10-CM | POA: Diagnosis not present

## 2019-01-28 DIAGNOSIS — J90 Pleural effusion, not elsewhere classified: Secondary | ICD-10-CM

## 2019-01-28 DIAGNOSIS — K5903 Drug induced constipation: Secondary | ICD-10-CM | POA: Diagnosis present

## 2019-01-28 DIAGNOSIS — Z9851 Tubal ligation status: Secondary | ICD-10-CM | POA: Diagnosis not present

## 2019-01-28 DIAGNOSIS — Z515 Encounter for palliative care: Secondary | ICD-10-CM | POA: Insufficient documentation

## 2019-01-28 DIAGNOSIS — D72829 Elevated white blood cell count, unspecified: Secondary | ICD-10-CM

## 2019-01-28 DIAGNOSIS — C50911 Malignant neoplasm of unspecified site of right female breast: Secondary | ICD-10-CM | POA: Diagnosis present

## 2019-01-28 LAB — CBC
HCT: 24.5 % — ABNORMAL LOW (ref 36.0–46.0)
Hemoglobin: 8.2 g/dL — ABNORMAL LOW (ref 12.0–15.0)
MCH: 25.2 pg — ABNORMAL LOW (ref 26.0–34.0)
MCHC: 33.5 g/dL (ref 30.0–36.0)
MCV: 75.4 fL — ABNORMAL LOW (ref 80.0–100.0)
Platelets: 465 10*3/uL — ABNORMAL HIGH (ref 150–400)
RBC: 3.25 MIL/uL — ABNORMAL LOW (ref 3.87–5.11)
RDW: 23.7 % — ABNORMAL HIGH (ref 11.5–15.5)
WBC: 14.9 10*3/uL — ABNORMAL HIGH (ref 4.0–10.5)
nRBC: 1 % — ABNORMAL HIGH (ref 0.0–0.2)

## 2019-01-28 LAB — FERRITIN: Ferritin: 1230 ng/mL — ABNORMAL HIGH (ref 11–307)

## 2019-01-28 LAB — BASIC METABOLIC PANEL
Anion gap: 13 (ref 5–15)
BUN: 6 mg/dL (ref 6–20)
CO2: 25 mmol/L (ref 22–32)
Calcium: 8.7 mg/dL — ABNORMAL LOW (ref 8.9–10.3)
Chloride: 97 mmol/L — ABNORMAL LOW (ref 98–111)
Creatinine, Ser: 0.53 mg/dL (ref 0.44–1.00)
GFR calc Af Amer: >60 mL/min (ref >60)
GFR calc non Af Amer: >60 mL/min (ref >60)
Glucose, Bld: 102 mg/dL — ABNORMAL HIGH (ref 70–99)
Potassium: 4 mmol/L (ref 3.5–5.1)
Sodium: 135 mmol/L (ref 135–145)

## 2019-01-28 LAB — IRON AND TIBC
Iron: 26 ug/dL — ABNORMAL LOW (ref 28–170)
Saturation Ratios: 20 % (ref 10.4–31.8)
TIBC: 130 ug/dL — ABNORMAL LOW (ref 250–450)
UIBC: 104 ug/dL

## 2019-01-28 LAB — FOLATE: Folate: 5.7 ng/mL — ABNORMAL LOW (ref >5.9)

## 2019-01-28 LAB — MAGNESIUM: Magnesium: 2.1 mg/dL (ref 1.7–2.4)

## 2019-01-28 LAB — SARS CORONAVIRUS 2 (TAT 6-24 HRS): SARS Coronavirus 2: NEGATIVE

## 2019-01-28 LAB — LACTATE DEHYDROGENASE: LDH: 534 U/L — ABNORMAL HIGH (ref 98–192)

## 2019-01-28 LAB — VITAMIN B12: Vitamin B-12: 2928 pg/mL — ABNORMAL HIGH (ref 180–914)

## 2019-01-28 MED ORDER — ONDANSETRON HCL 4 MG/2ML IJ SOLN: 4.0000 mg | Freq: Four times a day (QID) | INTRAMUSCULAR | Status: DC | PRN

## 2019-01-28 MED ORDER — POLYETHYLENE GLYCOL 3350 17 G PO PACK
17.0000 g | PACK | Freq: Every day | ORAL | Status: DC
  Administered 2019-01-28 – 2019-01-29 (×2): 17 g via ORAL
  Filled 2019-01-28 (×2): qty 1

## 2019-01-28 MED ORDER — KETOROLAC TROMETHAMINE 30 MG/ML IJ SOLN
30.0000 mg | Freq: Four times a day (QID) | INTRAMUSCULAR | Status: DC | PRN
  Administered 2019-01-28: 16:00:00 30 mg via INTRAVENOUS
  Filled 2019-01-28: qty 1

## 2019-01-28 MED ORDER — ACETAMINOPHEN 650 MG RE SUPP: 650.0000 mg | Freq: Four times a day (QID) | RECTAL | Status: DC | PRN

## 2019-01-28 MED ORDER — LORAZEPAM 0.5 MG PO TABS: 0.5000 mg | ORAL_TABLET | Freq: Four times a day (QID) | ORAL | Status: DC | PRN

## 2019-01-28 MED ORDER — ONDANSETRON HCL 4 MG PO TABS
4.0000 mg | ORAL_TABLET | Freq: Four times a day (QID) | ORAL | Status: DC | PRN
  Filled 2019-01-28: qty 1

## 2019-01-28 MED ORDER — MAGNESIUM HYDROXIDE 400 MG/5ML PO SUSP: 30.0000 mL | Freq: Every day | ORAL | Status: DC | PRN

## 2019-01-28 MED ORDER — ONDANSETRON 4 MG PO TBDP
4.0000 mg | ORAL_TABLET | Freq: Four times a day (QID) | ORAL | Status: DC | PRN
  Filled 2019-01-28: qty 1

## 2019-01-28 MED ORDER — IOHEXOL 350 MG/ML SOLN
75.0000 mL | Freq: Once | INTRAVENOUS | Status: AC | PRN
  Administered 2019-01-28: 75 mL via INTRAVENOUS

## 2019-01-28 MED ORDER — NALOXEGOL OXALATE 25 MG PO TABS: 25.0000 mg | ORAL_TABLET | Freq: Every day | ORAL | 2 refills | Status: AC

## 2019-01-28 MED ORDER — SODIUM CHLORIDE 0.9 % IV SOLN
INTRAVENOUS | Status: DC
  Administered 2019-01-28: 04:00:00 via INTRAVENOUS

## 2019-01-28 MED ORDER — POTASSIUM CHLORIDE 20 MEQ PO PACK
40.0000 meq | PACK | Freq: Once | ORAL | Status: DC
  Filled 2019-01-28 (×2): qty 2

## 2019-01-28 MED ORDER — PROCHLORPERAZINE MALEATE 10 MG PO TABS
10.0000 mg | ORAL_TABLET | Freq: Four times a day (QID) | ORAL | Status: DC | PRN
  Filled 2019-01-28: qty 1

## 2019-01-28 MED ORDER — DEXAMETHASONE 4 MG PO TABS
4.0000 mg | ORAL_TABLET | Freq: Every day | ORAL | Status: DC
  Administered 2019-01-28: 4 mg via ORAL
  Filled 2019-01-28 (×2): qty 1

## 2019-01-28 MED ORDER — OXYCODONE HCL 5 MG PO TABS
10.0000 mg | ORAL_TABLET | ORAL | Status: DC | PRN
  Administered 2019-01-28 – 2019-01-29 (×5): 10 mg via ORAL
  Filled 2019-01-28 (×8): qty 2

## 2019-01-28 MED ORDER — TRAZODONE HCL 50 MG PO TABS
25.0000 mg | ORAL_TABLET | Freq: Every evening | ORAL | Status: DC | PRN
  Filled 2019-01-28: qty 0.5

## 2019-01-28 MED ORDER — DOCUSATE SODIUM 100 MG PO CAPS
100.0000 mg | ORAL_CAPSULE | Freq: Two times a day (BID) | ORAL | Status: DC
  Administered 2019-01-28 – 2019-01-29 (×2): 100 mg via ORAL
  Filled 2019-01-28 (×2): qty 1

## 2019-01-28 MED ORDER — POTASSIUM CHLORIDE 20 MEQ PO PACK
40.0000 meq | PACK | Freq: Once | ORAL | Status: AC
  Administered 2019-01-28: 04:00:00 40 meq via ORAL
  Filled 2019-01-28: qty 2

## 2019-01-28 MED ORDER — ONDANSETRON HCL 4 MG PO TABS
8.0000 mg | ORAL_TABLET | Freq: Three times a day (TID) | ORAL | Status: DC | PRN
  Filled 2019-01-28: qty 2

## 2019-01-28 MED ORDER — ACETAMINOPHEN 325 MG PO TABS
650.0000 mg | ORAL_TABLET | Freq: Four times a day (QID) | ORAL | Status: DC | PRN
  Administered 2019-01-28 – 2019-01-29 (×3): 650 mg via ORAL
  Filled 2019-01-28 (×3): qty 2

## 2019-01-28 MED ORDER — MORPHINE SULFATE (PF) 2 MG/ML IV SOLN
2.0000 mg | INTRAVENOUS | Status: DC | PRN
  Administered 2019-01-28 (×2): 2 mg via INTRAVENOUS
  Filled 2019-01-28 (×2): qty 1

## 2019-01-28 MED ORDER — LIDOCAINE-PRILOCAINE 2.5-2.5 % EX CREA
1.0000 "application " | TOPICAL_CREAM | CUTANEOUS | Status: DC
  Filled 2019-01-28: qty 5

## 2019-01-28 MED ORDER — MORPHINE SULFATE (PF) 4 MG/ML IV SOLN
4.0000 mg | Freq: Once | INTRAVENOUS | Status: AC
  Administered 2019-01-28: 02:00:00 4 mg via INTRAVENOUS
  Filled 2019-01-28: qty 1

## 2019-01-28 MED ORDER — GABAPENTIN 300 MG PO CAPS
300.0000 mg | ORAL_CAPSULE | Freq: Three times a day (TID) | ORAL | Status: DC
  Administered 2019-01-28 – 2019-01-29 (×4): 300 mg via ORAL
  Filled 2019-01-28 (×5): qty 1

## 2019-01-28 MED ORDER — ENOXAPARIN SODIUM 40 MG/0.4ML ~~LOC~~ SOLN: 40.0000 mg | SUBCUTANEOUS | Status: DC

## 2019-01-28 MED ORDER — OLANZAPINE 5 MG PO TABS
10.0000 mg | ORAL_TABLET | Freq: Every day | ORAL | Status: DC
  Administered 2019-01-28: 10 mg via ORAL
  Filled 2019-01-28: qty 2
  Filled 2019-01-28: qty 1

## 2019-01-28 NOTE — ED Provider Notes (Addendum)
----------------------------------------- 1:24 AM on 01/28/2019 -----------------------------------------   Blood pressure (!) 141/87, pulse 81, temperature 98.4 F (36.9 C), temperature source Oral, resp. rate 16, height 5\' 5"  (1.651 m), weight 54.6 kg, last menstrual period 11/27/2018, SpO2 99 %.  Assuming care from Dr. Ellender Hose of [name] is a 33 y.o. female with a chief complaint of Back Pain and possible clogged drain .    Please refer to H&P by previous MD for further details.  The current plan of care is to f/u CTA to rule out PE or PNA.    CTA consistent with worsening metastatic disease and new pleural effusion. Pain not well controlled. Will admit to Hospitalist. Surgical site is well healing with no erythema, warmth, discharge, pain or tenderness.     I have personally reviewed the images performed during this visit and I agree with the Radiologist's read.   Interpretation by Radiologist:  Dg Chest 2 View  Result Date: 01/27/2019 CLINICAL DATA:  Right-sided chest pain status post mastectomy EXAM: CHEST - 2 VIEW COMPARISON:  11/05/2018 FINDINGS: Left-sided central venous catheter with tip over the proximal right atrium. Drainage catheter over the right inframammary region. Small right greater than left pleural effusion. Mild airspace disease at the right base. Bilateral hilar fullness, possible nodes. Perihilar interstitial opacity. No pneumothorax. IMPRESSION: 1. Small bilateral pleural effusions, right greater than left with atelectasis or mild infiltrate at the right base. 2. Increased perihilar interstitial opacity which may reflect atypical infection, less likely edema, or possible interstitial spread of disease. 3. Bilateral hilar fullness, cannot exclude hilar nodes. Electronically Signed   By: Donavan Foil M.D.   On: 01/27/2019 22:15   Ct Angio Chest Pe W And/or Wo Contrast  Result Date: 01/28/2019 CLINICAL DATA:  Right upper back and rib pain, increasing  drainage from mastectomy site, procedure performed 01/23/2019 EXAM: CT ANGIOGRAPHY CHEST WITH CONTRAST TECHNIQUE: Multidetector CT imaging of the chest was performed using the standard protocol during bolus administration of intravenous contrast. Multiplanar CT image reconstructions and MIPs were obtained to evaluate the vascular anatomy. CONTRAST:  59mL OMNIPAQUE IOHEXOL 350 MG/ML SOLN COMPARISON:  PET CT November 04, 2018 FINDINGS: Cardiovascular: Satisfactory opacification the pulmonary arteries to the segmental level. No pulmonary artery filling defects are identified. Central pulmonary arteries are normal caliber. No elevation of the RV/LV ratio (0.7). Left subclavian approach Port-A-Cath tip terminates at the cavoatrial junction. Normal cardiac size. Trace pericardial fluid. Thoracic aorta is normal caliber. Normal 3 vessel branching of the aortic arch. No luminal irregularity is seen. Mediastinum/Nodes: There is increasing bulky mediastinal, hilar and axillary adenopathy, much of which was previously FDG avid on comparison PET-CT. Reference lymph nodes include a right axillary lymph node measuring 18 mm, previously 14 mm (4/34). A 13 mm left axillary lymph node not clearly evident on comparison study (4/29). A 15 mm right hilar lymph node, measuring approximately 12 mm previously (4/44). And a 12 mm AP window lymph node, not clearly present on comparison (4/37). No acute abnormality of the trachea or esophagus. Lungs/Pleura: There is a moderate right pleural effusion and trace left effusion. No abnormal pleural thickening or enhancement adjacent areas of passive atelectasis are noted. Some interlobular septal thickening is present as well. There are several scattered subpleural nodules, largest measuring up to 6 mm in size (6/60). Upper Abdomen: Numerous hypoattenuating nodules throughout the liver, not clearly evident on comparison PET-CT. Musculoskeletal: There are postsurgical changes from recent right  mastectomy and right axillary nodal dissection with surgical clips.  There is soft tissue gas and stranding adjacent the right pectoralis musculature with a a surgical soft tissue drain coursing from the right chest wall and terminating just to the left of midline. Axillary fluid in stranding is noted as well (4/49). There is some abnormal hypoattenuation in the right pectoralis musculature (4/56) nonspecific given recent postoperative state but an intramuscular hematoma, abscess or breast mass is not excluded. Review of the MIP images confirms the above findings. IMPRESSION: 1. No evidence of pulmonary embolism. 2. Postsurgical changes from recent right mastectomy and right axillary nodal dissection with soft tissue gas and stranding adjacent to the right pectoralis musculature with a surgical soft tissue drain coursing from the right chest wall and terminating just to the left of midline. There is some abnormal hypoattenuation in the medial right pectoralis musculature, nonspecific given recent postoperative state and proximity to the primary breast lesion, but an intramuscular hematoma, abscess or breast mass is not excluded. 3. Numerous hypoattenuating nodules throughout the liver, not clearly evident on comparison PET-CT, concerning for metastatic disease. 4. Increased bulky mediastinal, hilar and axillary adenopathy, much of which was previously FDG avid on comparison PET-CT, concerning for metastatic disease. 5. Few rounded subpleural nodule suspicious for further metastatic disease are present as well. 6. The moderate right and trace left pleural effusions. Malignant effusion is highly likely though no pleural thickening or enhancement is seen. These results were called by telephone at the time of interpretation on 01/28/2019 at 12:36 am to provider Dr Alfred Levins , who verbally acknowledged these results. Electronically Signed   By: Lovena Le M.D.   On: 01/28/2019 00:37       Rudene Re,  MD 01/28/19 Kathleene Hazel, [place], MD 01/28/19 [phone]

## 2019-01-28 NOTE — Consult Note (Addendum)
[facility] SURGICAL CONSULTATION NOTE (initial) - [id number]   HISTORY OF PRESENT ILLNESS (HPI):  33 y.o. female presented to [facility] ED overnight for evaluation of back pain. Patient underwent right mastectomy with Dr Dahlia Byes on 11/19. She did well post-op and went home. She presented to the ED yesterday secondary to upper back pain. She reported that over the last 24 hours she developed a constant sharp aching upper back pain along the midline thoracic spine. She denied any pain from her incision site and has had no issues with drainage or erythema from her incision. JP drainage has remained serosanguinous and slowed. No fever, chills, abdominal pain, nausea, or emesis. She was admitted to medicine for pain management.   Surgery is consulted by hospitalist physician Dr. Eugenie Norrie, MD in this context for evaluation and management of post-surgical pain and drain management following mastectomy.   PAST MEDICAL HISTORY (PMH):  Past Medical History:  Diagnosis Date  . Anemia   . Breast cancer ([place])      PAST SURGICAL HISTORY ([place]):  Past Surgical History:  Procedure Laterality Date  . BREAST BIOPSY Right 10/25/2018   Heart Clip, Pending path  . BREAST BIOPSY Right 10/25/2018   Lymph node biopsy, Hydromarker (butterfly), path pending  . CESAREAN SECTION    . CESAREAN SECTION N/A 04/24/2016   Procedure: REPEAT CESAREAN SECTION WITH BILATERAL TUBALIGATION;  Surgeon: Brayton Mars, MD;  Location: [facility] ORS;  Service: Obstetrics;  Laterality: N/A;  Baby Boy born @ 10 Apgars:8/9 Weight: 8lb 9oz  . PORTACATH PLACEMENT Left 11/05/2018   Procedure: INSERTION PORT-A-CATH;  Surgeon: Jules Husbands, MD;  Location: [facility] ORS;  Service: General;  Laterality: Left;  . PRIMARY CLOSURE Right 01/23/2019   Procedure: CLOSURE OF RIGHT BREAST;  Surgeon: Wallace Going, DO;  Location: [facility] ORS;  Service: Plastics;  Laterality: Right;  . SIMPLE MASTECTOMY WITH AXILLARY SENTINEL NODE  BIOPSY Right 01/23/2019   Procedure: SIMPLE MASTECTOMY;  Surgeon: Jules Husbands, MD;  Location: [facility] ORS;  Service: General;  Laterality: Right;  combined surgery with Dr Baltazar Apo     MEDICATIONS:  Prior to Admission medications   Medication Sig Start Date End Date Taking? Authorizing Provider  dexamethasone (DECADRON) 4 MG tablet Take 2 tablets by mouth once a day on the day after chemotherapy and then take 2 tablets two times a day for 2 days. Take with food. 11/07/18  Yes Sindy Guadeloupe, MD  gabapentin (NEURONTIN) 300 MG capsule Take 1 capsule (300 mg total) by mouth 3 (three) times daily. 11/04/18  Yes Pabon, [place], MD  lidocaine-prilocaine (EMLA) cream Apply 1 application topically as directed. Apply small amount over port site 1 hour before treatments, cover the cream with saran wrap to protect your clothing 01/13/19  Yes Sindy Guadeloupe, MD  LORazepam (ATIVAN) 0.5 MG tablet Take 1 tablet (0.5 mg total) by mouth every 6 (six) hours as needed (Nausea or vomiting). 11/07/18  Yes Sindy Guadeloupe, MD  OLANZapine (ZYPREXA) 10 MG tablet Take 1 tablet (10 mg total) by mouth at bedtime. Take a tablet day 2 through day 5 after each chemo treatment 12/03/18  Yes Sindy Guadeloupe, MD  ondansetron (ZOFRAN) 8 MG tablet Take 1 tablet (8 mg total) by mouth 2 (two) times daily as needed. Start on the third day after chemotherapy. 11/07/18  Yes Sindy Guadeloupe, MD  Oxycodone HCl 10 MG TABS Take 1 tablet (10 mg total) by mouth every 4 (four) hours as  needed for up to 15 days. 01/24/19 02/08/19 Yes Tylene Fantasia, PA-C  prochlorperazine (COMPAZINE) 10 MG tablet Take 1 tablet (10 mg total) by mouth every 6 (six) hours as needed (Nausea or vomiting). 11/07/18  Yes Sindy Guadeloupe, MD     ALLERGIES:  No Known Allergies   SOCIAL HISTORY:  Social History   Socioeconomic History  . Marital status: Single    Spouse name: Not on file  . Number of children: Not on file  . Years of education: Not on file  . Highest  education level: Not on file  Occupational History  . Not on file  Social Needs  . Financial resource strain: Not on file  . Food insecurity    Worry: Not on file    Inability: Not on file  . Transportation needs    Medical: Not on file    Non-medical: Not on file  Tobacco Use  . Smoking status: Current Every Day Smoker    Packs/day: 0.25    Years: 5.00    Pack years: 1.25    Types: Cigarettes  . Smokeless tobacco: Never Used  Substance and Sexual Activity  . Alcohol use: No  . Drug use: No  . Sexual activity: Yes  Lifestyle  . Physical activity    Days per week: Not on file    Minutes per session: Not on file  . Stress: Not on file  Relationships  . Social Herbalist on phone: Not on file    Gets together: Not on file    Attends religious service: Not on file    Active member of club or organization: Not on file    Attends meetings of clubs or organizations: Not on file    Relationship status: Not on file  . Intimate partner violence    Fear of current or ex partner: Not on file    Emotionally abused: Not on file    Physically abused: Not on file    Forced sexual activity: Not on file  Other Topics Concern  . Not on file  Social History Narrative  . Not on file     FAMILY HISTORY:  Family History  Problem Relation Age of Onset  . Hypertension Mother   . Stroke Father   . Cancer Maternal Grandmother   . Diabetes Maternal Grandfather   . Heart disease Maternal Grandfather   . Hypertension Maternal Grandfather       REVIEW OF SYSTEMS:  Review of Systems  Constitutional: Negative for chills and fever.  Respiratory: Negative for cough and shortness of breath.   Cardiovascular: Negative for chest pain and palpitations.  Gastrointestinal: Negative for abdominal pain, constipation, diarrhea, nausea and vomiting.  Musculoskeletal: Positive for back pain and myalgias.  Neurological: Negative for dizziness and headaches.  All other systems reviewed  and are negative.   VITAL SIGNS:  Temp:  [98.2 F (36.8 C)-98.5 F (36.9 C)] 98.5 F (36.9 C) (11/24 0810) Pulse Rate:  [77-92] 86 (11/24 0810) Resp:  [16-18] 16 (11/24 0810) BP: (138-172)/(87-100) 138/91 (11/24 0810) SpO2:  [97 %-100 %] 97 % (11/24 0810) Weight:  [54.6 kg] 54.6 kg (11/23 2117)     Height: 5\' 5"  (165.1 cm) Weight: 54.6 kg BMI (Calculated): 20.02   INTAKE/OUTPUT:  11/23 0701 - 11/24 0700 In: 240 [P.O.:240] Out: 30 [Drains:30]  PHYSICAL EXAM:  Physical Exam Vitals signs and nursing note reviewed. Exam conducted with a chaperone present.  Constitutional:  Appearance: Normal appearance.  HENT:     Head: Normocephalic and atraumatic.  Eyes:     General: No scleral icterus.    Conjunctiva/sclera: Conjunctivae normal.  Cardiovascular:     Rate and Rhythm: Normal rate and regular rhythm.     Pulses: Normal pulses.     Heart sounds: Normal heart sounds. No murmur. No gallop.   Pulmonary:     Effort: Pulmonary effort is normal. No respiratory distress.     Breath sounds: Normal breath sounds. No wheezing or rhonchi.  Chest:    Genitourinary:    Comments: Deferred Musculoskeletal:     Right lower leg: No edema.     Left lower leg: No edema.  Skin:    General: Skin is warm and dry.  Neurological:     General: No focal deficit present.     Mental Status: She is alert.  Psychiatric:        Mood and Affect: Mood normal.        Behavior: Behavior normal.      Labs:  CBC Latest Ref Rng & Units 01/28/2019 01/27/2019 01/23/2019  WBC 4.0 - 10.5 K/uL 14.9(H) 14.3(H) 11.7(H)  Hemoglobin 12.0 - 15.0 g/dL 8.2(L) 7.8(L) 9.0(L)  Hematocrit 36.0 - 46.0 % 24.5(L) 24.0(L) 27.6(L)  Platelets 150 - 400 K/uL 465(H) 462(H) 358   CMP Latest Ref Rng & Units 01/28/2019 01/27/2019 01/23/2019  Glucose 70 - 99 mg/dL 102(H) 107(H) -  BUN 6 - 20 mg/dL 6 8 -  Creatinine 0.44 - 1.00 mg/dL 0.53 0.64 0.52  Sodium 135 - 145 mmol/L 135 138 -  Potassium 3.5 - 5.1 mmol/L 4.0  3.4(L) -  Chloride 98 - 111 mmol/L 97(L) 98 -  CO2 22 - 32 mmol/L 25 26 -  Calcium 8.9 - 10.3 mg/dL 8.7(L) 8.9 -  Total Protein 6.5 - 8.1 g/dL - 6.6 -  Total Bilirubin 0.3 - 1.2 mg/dL - 0.3 -  Alkaline Phos 38 - 126 U/L - 161(H) -  AST 15 - 41 U/L - 46(H) -  ALT 0 - 44 U/L - 33 -    Imaging studies:   CTA Chest (01/27/2019) personally reviewed and agree with radiologist interpretation below:  IMPRESSION: 1. No evidence of pulmonary embolism. 2. Postsurgical changes from recent right mastectomy and right axillary nodal dissection with soft tissue gas and stranding adjacent to the right pectoralis musculature with a surgical soft tissue drain coursing from the right chest wall and terminating just to the left of midline. There is some abnormal hypoattenuation in the medial right pectoralis musculature, nonspecific given recent postoperative state and proximity to the primary breast lesion, but an intramuscular hematoma, abscess or breast mass is not excluded. 3. Numerous hypoattenuating nodules throughout the liver, not clearly evident on comparison PET-CT, concerning for metastatic disease. 4. Increased bulky mediastinal, hilar and axillary adenopathy, much of which was previously FDG avid on comparison PET-CT, concerning for metastatic disease. 5. Few rounded subpleural nodule suspicious for further metastatic disease are present as well. 6. The moderate right and trace left pleural effusions. Malignant effusion is highly likely though no pleural thickening or enhancement is seen.   Assessment/Plan: (ICD-10's: Z90.11) 33 y.o. female with upper back pain s/p right mastectomy with surgical drain on 11/19 with Dr Dahlia Byes, complicated by pertinent comorbidities including widely metastatic right breast cancer.   - I do not suspect any surgical complications at this point. There are no issues with incision and drain remains serosanguinous and output slowing as expected.     -  Agree  with palliative care consult for pain management  - Continue breast binder  - Continue drain, monitor output   - No surgical issues at this time   - Further management per primary team  All of the above findings and recommendations were discussed with the patient, and all of patient's questions were answered to her expressed satisfaction.  Thank you for the opportunity to participate in this patient's care.   -- Edison Simon, PA-C [facility] 01/28/2019, 9:38 AM [phone] M-F: 7am - 4pm  I saw and evaluated the patient.  I agree with the above documentation, exam, and plan, which I have edited where appropriate. Fredirick Maudlin  12:45 PM

## 2019-01-28 NOTE — Progress Notes (Signed)
To MRI

## 2019-01-28 NOTE — ED Notes (Addendum)
Attempted to call report to 1A, Network engineer states that the charge RN is sorting through the patients and will have to call me back, charge RN Nira Conn informed

## 2019-01-28 NOTE — Progress Notes (Signed)
   01/28/19 1600  Clinical Encounter Type  Visited With Patient  Visit Type Follow-up  Referral From Palliative care team  Consult/Referral To Chaplain  Spiritual Encounters  Spiritual Needs Prayer  Stress Factors  Patient Stress Factors Health changes;Major life changes   This chaplain followed up with the patient and worked to establish positive rapport for necessary difficult conversations around her prognosis. The patient was open to talking with the patient and reported her physical pain/discomfort and desire for Tylenol. This chaplain made the patient's nurse aware of her request. The patient reported that she was recovering from a mastectomy. She also shared that she had not been back to the cancer center for treatment because of the way people talked about her death and dying. The patient reported that she wants to live and see her children, who are 2, 4, 8, and 17, grow up. The patient said that is all she wants in this world. The patient said that it was difficult to hear this news because she wants to have a future. The patient reported that she plans to return to the [place] for treatment next week. This chaplain provided support in the form of compassionate ministerial presence, active and reflective listening, theological reflection, encouragement, hope, and prayer.   Follow up: Continue to encourage the patient to share her thoughts and feelings and support her as she processes.

## 2019-01-28 NOTE — Progress Notes (Signed)
   01/28/19 1330  Clinical Encounter Type  Visited With Patient;Health care provider  Visit Type Initial  Referral From Palliative care team  Consult/Referral To Chaplain   Chaplain received a referral from Palliative Care to support this patient in the wake of difficult news re:her diagnosis and to assist with completion of AD documents, if the patient wishes. Upon arrival, the patient was sitting up in bed with the lights off and watching television. The patient was welcoming to this patient, but also quiet. The patient affirmed that she would appreciate a later visit. Chaplain will follow up.

## 2019-01-28 NOTE — ED Notes (Signed)
Attempted to call report to 1A, held for 10 mins and was unable to give report

## 2019-01-28 NOTE — Consult Note (Signed)
[place]  [phone] [phone]   Name: [name] Date: 01/28/2019 MRN: [id number]  DOB: 1986-02-17  Patient Care Team: Bunnie Pion, FNP as PCP - General (Family Medicine) Rico Junker, RN as Registered Nurse Theodore Demark, RN as Registered Nurse    REASON FOR CONSULTATION: Palliative Care consult requested for this 33 y.o. 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  She is now readmitted 01/28/2019 with intractable back pain and possible clogged JP drain.  Patient has a history of noncompliance with treatment including chemotherapy with several recent missed appointments at the [facility].  Palliative care was consulted to help address goals and manage ongoing symptoms.  SOCIAL HISTORY:     reports that she has been smoking cigarettes. She has a 1.25 pack-year smoking history. She has never used smokeless tobacco. She reports that she does not drink alcohol or use drugs.   Patient is unmarried.  She is a single mother of four children, the oldest of which is 12 and the other children are under the age of 64.  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  Her father lives in the home but is also disabled.  Her mother is involved and sees her daily.  Patient also has a brother who is involved.  ADVANCE DIRECTIVES:  Does not have  CODE STATUS: Full code  PAST MEDICAL HISTORY: Past Medical History:  Diagnosis Date  . Anemia   . Breast cancer ([place])     PAST SURGICAL HISTORY:  Past Surgical History:  Procedure Laterality Date  . BREAST BIOPSY Right 10/25/2018   Heart Clip, Pending path  . BREAST BIOPSY Right 10/25/2018   Lymph node biopsy, [facility]arker (butterfly), path pending  . CESAREAN SECTION    . CESAREAN SECTION  N/A 04/24/2016   Procedure: REPEAT CESAREAN SECTION WITH BILATERAL TUBALIGATION;  Surgeon: Brayton Mars, MD;  Location: [facility] ORS;  Service: Obstetrics;  Laterality: N/A;  Baby Boy born @ 71 Apgars:8/9 Weight: 8lb 9oz  . PORTACATH PLACEMENT Left 11/05/2018   Procedure: INSERTION PORT-A-CATH;  Surgeon: Jules Husbands, MD;  Location: [facility] ORS;  Service: General;  Laterality: Left;  . PRIMARY CLOSURE Right 01/23/2019   Procedure: CLOSURE OF RIGHT BREAST;  Surgeon: Wallace Going, DO;  Location: [facility] ORS;  Service: Plastics;  Laterality: Right;  . SIMPLE MASTECTOMY WITH AXILLARY SENTINEL NODE BIOPSY Right 01/23/2019   Procedure: SIMPLE MASTECTOMY;  Surgeon: Jules Husbands, MD;  Location: [facility] ORS;  Service: General;  Laterality: Right;  combined surgery with Dr Baltazar Apo    HEMATOLOGY/ONCOLOGY HISTORY:  Oncology History  Breast cancer ([place])  11/05/2018 Cancer Staging   Staging form: Breast, AJCC 8th Edition - Clinical stage from 11/05/2018: Stage IV (cT4, cN3, cM1, G3, ER-, PR-, HER2-) - Signed by Sindy Guadeloupe, MD on 11/08/2018   11/07/2018 Initial Diagnosis   Breast cancer ([place])   11/12/2018 -  Chemotherapy   The patient had DOXOrubicin (ADRIAMYCIN) chemo injection 90 mg, 60 mg/m2 = 90 mg, Intravenous,  Once, 2 of 4 cycles Administration: 90 mg (11/12/2018), 90 mg (12/03/2018) palonosetron (ALOXI) injection 0.25 mg, 0.25 mg, Intravenous,  Once, 2 of 4 cycles Administration: 0.25 mg (11/12/2018), 0.25 mg (12/03/2018) pegfilgrastim-jmdb (FULPHILA) injection 6 mg, 6 mg, Subcutaneous,  Once, 2 of 4 cycles Administration: 6 mg (11/13/2018),  6 mg (12/04/2018) cyclophosphamide (CYTOXAN) 900 mg in sodium chloride 0.9 % 250 mL chemo infusion, 600 mg/m2 = 900 mg, Intravenous,  Once, 2 of 4 cycles Administration: 900 mg (11/12/2018), 900 mg (12/03/2018) fosaprepitant (EMEND) 150 mg, dexamethasone (DECADRON) 12 mg in sodium chloride 0.9 % 145 mL IVPB, , Intravenous,  Once, 2 of 4 cycles Administration:   (11/12/2018),  (12/03/2018)  for chemotherapy treatment.      ALLERGIES:  has No Known Allergies.  MEDICATIONS:  Current Facility-Administered Medications  Medication Dose Route Frequency Provider Last Rate Last Dose  . 0.9 %  sodium chloride infusion   Intravenous Continuous Mansy, Jan A, MD 100 mL/hr at 01/28/19 1011    . acetaminophen (TYLENOL) tablet 650 mg  650 mg Oral Q6H PRN Mansy, Jan A, MD   650 mg at 01/28/19 0554   Or  . acetaminophen (TYLENOL) suppository 650 mg  650 mg Rectal Q6H PRN Mansy, Jan A, MD      . dexamethasone (DECADRON) tablet 4 mg  4 mg Oral Daily Mansy, Jan A, MD   4 mg at 01/28/19 1006  . enoxaparin (LOVENOX) injection 40 mg  40 mg Subcutaneous Q24H Mansy, Jan A, MD      . gabapentin (NEURONTIN) capsule 300 mg  300 mg Oral TID Mansy, Jan A, MD   300 mg at 01/28/19 1007  . ketorolac (TORADOL) 30 MG/ML injection 30 mg  30 mg Intravenous Q6H PRN Mansy, Jan A, MD      . lidocaine-prilocaine (EMLA) cream 1 application  1 application Topical UD Mansy, Jan A, MD      . LORazepam (ATIVAN) tablet 0.5 mg  0.5 mg Oral Q6H PRN Mansy, Jan A, MD      . magnesium hydroxide (MILK OF MAGNESIA) suspension 30 mL  30 mL Oral Daily PRN Mansy, Jan A, MD      . morphine 2 MG/ML injection 2 mg  2 mg Intravenous Q4H PRN Mansy, Jan A, MD   2 mg at 01/28/19 0758  . OLANZapine (ZYPREXA) tablet 10 mg  10 mg Oral QHS Mansy, Jan A, MD      . ondansetron (ZOFRAN-ODT) disintegrating tablet 4 mg  4 mg Oral Q6H PRN Mansy, Jan A, MD       Or  . ondansetron [facility]) injection 4 mg  4 mg Intravenous Q6H PRN Mansy, Jan A, MD      . ondansetron [facility]) tablet 8 mg  8 mg Oral Q8H PRN Mansy, Jan A, MD      . oxyCODONE (Oxy IR/ROXICODONE) immediate release tablet 10 mg  10 mg Oral Q4H PRN Mansy, Jan A, MD   10 mg at 01/28/19 1039  . potassium chloride (KLOR-CON) packet 40 mEq  40 mEq Oral Once Mansy, Jan A, MD      . prochlorperazine (COMPAZINE) tablet 10 mg  10 mg Oral Q6H PRN Mansy, Jan A, MD      .  traZODone (DESYREL) tablet 25 mg  25 mg Oral QHS PRN Mansy, Arvella Merles, MD       Current Outpatient Medications  Medication Sig Dispense Refill  . dexamethasone (DECADRON) 4 MG tablet Take 2 tablets by mouth once a day on the day after chemotherapy and then take 2 tablets two times a day for 2 days. Take with food. 30 tablet 1  . gabapentin (NEURONTIN) 300 MG capsule Take 1 capsule (300 mg total) by mouth 3 (three) times daily. 90 capsule 0  . lidocaine-prilocaine (EMLA) cream Apply  1 application topically as directed. Apply small amount over port site 1 hour before treatments, cover the cream with saran wrap to protect your clothing 30 g 0  . LORazepam (ATIVAN) 0.5 MG tablet Take 1 tablet (0.5 mg total) by mouth every 6 (six) hours as needed (Nausea or vomiting). 30 tablet 0  . OLANZapine (ZYPREXA) 10 MG tablet Take 1 tablet (10 mg total) by mouth at bedtime. Take a tablet day 2 through day 5 after each chemo treatment 30 tablet 0  . ondansetron (ZOFRAN) 8 MG tablet Take 1 tablet (8 mg total) by mouth 2 (two) times daily as needed. Start on the third day after chemotherapy. 30 tablet 1  . Oxycodone HCl 10 MG TABS Take 1 tablet (10 mg total) by mouth every 4 (four) hours as needed for up to 15 days. 25 tablet 0  . prochlorperazine (COMPAZINE) 10 MG tablet Take 1 tablet (10 mg total) by mouth every 6 (six) hours as needed (Nausea or vomiting). 30 tablet 1    VITAL SIGNS: BP (!) 138/91 (BP Location: Left Arm)   Pulse 86   Temp 98.5 F (36.9 C) (Oral)   Resp 16   Ht _0  (1.651 m)   Wt 120 lb 4.8 oz (54.6 kg)   LMP 11/27/2018 Comment: 11/19 neg preg test  SpO2 97%   BMI 20.02 kg/m  Filed Weights   01/27/19 2117  Weight: 120 lb 4.8 oz (54.6 kg)    Estimated body mass index is 20.02 kg/m as calculated from the following:   Height as of this encounter: _1  (1.651 m).   Weight as of this encounter: 120 lb 4.8 oz (54.6 kg).  LABS: CBC:    Component Value Date/Time   WBC 14.9 (H)  01/28/2019 0513   HGB 8.2 (L) 01/28/2019 0513   HCT 24.5 (L) 01/28/2019 0513   PLT 465 (H) 01/28/2019 0513   MCV 75.4 (L) 01/28/2019 0513   NEUTROABS 10.4 (H) 01/27/2019 2303   LYMPHSABS 1.7 01/27/2019 2303   MONOABS 1.0 01/27/2019 2303   EOSABS 0.2 01/27/2019 2303   BASOSABS 0.1 01/27/2019 2303   Comprehensive Metabolic Panel:    Component Value Date/Time   NA 135 01/28/2019 0513   K 4.0 01/28/2019 0513   CL 97 (L) 01/28/2019 0513   CO2 25 01/28/2019 0513   BUN 6 01/28/2019 0513   CREATININE 0.53 01/28/2019 0513   GLUCOSE 102 (H) 01/28/2019 0513   CALCIUM 8.7 (L) 01/28/2019 0513   AST 46 (H) 01/27/2019 2303   ALT 33 01/27/2019 2303   ALKPHOS 161 (H) 01/27/2019 2303   BILITOT 0.3 01/27/2019 2303   PROT 6.6 01/27/2019 2303   ALBUMIN 2.8 (L) 01/27/2019 2303    RADIOGRAPHIC STUDIES: Dg Chest 2 View  Result Date: 01/27/2019 CLINICAL DATA:  Right-sided chest pain status post mastectomy EXAM: CHEST - 2 VIEW COMPARISON:  11/05/2018 FINDINGS: Left-sided central venous catheter with tip over the proximal right atrium. Drainage catheter over the right inframammary region. Small right greater than left pleural effusion. Mild airspace disease at the right base. Bilateral hilar fullness, possible nodes. Perihilar interstitial opacity. No pneumothorax. IMPRESSION: 1. Small bilateral pleural effusions, right greater than left with atelectasis or mild infiltrate at the right base. 2. Increased perihilar interstitial opacity which may reflect atypical infection, less likely edema, or possible interstitial spread of disease. 3. Bilateral hilar fullness, cannot exclude hilar nodes. Electronically Signed   By: Donavan Foil M.D.   On: 01/27/2019 22:15  Ct Angio Chest Pe W And/or Wo Contrast  Result Date: 01/28/2019 CLINICAL DATA:  Right upper back and rib pain, increasing drainage from mastectomy site, procedure performed 01/23/2019 EXAM: CT ANGIOGRAPHY CHEST WITH CONTRAST TECHNIQUE:  Multidetector CT imaging of the chest was performed using the standard protocol during bolus administration of intravenous contrast. Multiplanar CT image reconstructions and MIPs were obtained to evaluate the vascular anatomy. CONTRAST:  35m OMNIPAQUE IOHEXOL 350 MG/ML SOLN COMPARISON:  PET CT November 04, 2018 FINDINGS: Cardiovascular: Satisfactory opacification the pulmonary arteries to the segmental level. No pulmonary artery filling defects are identified. Central pulmonary arteries are normal caliber. No elevation of the RV/LV ratio (0.7). Left subclavian approach Port-A-Cath tip terminates at the cavoatrial junction. Normal cardiac size. Trace pericardial fluid. Thoracic aorta is normal caliber. Normal 3 vessel branching of the aortic arch. No luminal irregularity is seen. Mediastinum/Nodes: There is increasing bulky mediastinal, hilar and axillary adenopathy, much of which was previously FDG avid on comparison PET-CT. Reference lymph nodes include a right axillary lymph node measuring 18 mm, previously 14 mm (4/34). A 13 mm left axillary lymph node not clearly evident on comparison study (4/29). A 15 mm right hilar lymph node, measuring approximately 12 mm previously (4/44). And a 12 mm AP window lymph node, not clearly present on comparison (4/37). No acute abnormality of the trachea or esophagus. Lungs/Pleura: There is a moderate right pleural effusion and trace left effusion. No abnormal pleural thickening or enhancement adjacent areas of passive atelectasis are noted. Some interlobular septal thickening is present as well. There are several scattered subpleural nodules, largest measuring up to 6 mm in size (6/60). Upper Abdomen: Numerous hypoattenuating nodules throughout the liver, not clearly evident on comparison PET-CT. Musculoskeletal: There are postsurgical changes from recent right mastectomy and right axillary nodal dissection with surgical clips. There is soft tissue gas and stranding adjacent the  right pectoralis musculature with a a surgical soft tissue drain coursing from the right chest wall and terminating just to the left of midline. Axillary fluid in stranding is noted as well (4/49). There is some abnormal hypoattenuation in the right pectoralis musculature (4/56) nonspecific given recent postoperative state but an intramuscular hematoma, abscess or breast mass is not excluded. Review of the MIP images confirms the above findings. IMPRESSION: 1. No evidence of pulmonary embolism. 2. Postsurgical changes from recent right mastectomy and right axillary nodal dissection with soft tissue gas and stranding adjacent to the right pectoralis musculature with a surgical soft tissue drain coursing from the right chest wall and terminating just to the left of midline. There is some abnormal hypoattenuation in the medial right pectoralis musculature, nonspecific given recent postoperative state and proximity to the primary breast lesion, but an intramuscular hematoma, abscess or breast mass is not excluded. 3. Numerous hypoattenuating nodules throughout the liver, not clearly evident on comparison PET-CT, concerning for metastatic disease. 4. Increased bulky mediastinal, hilar and axillary adenopathy, much of which was previously FDG avid on comparison PET-CT, concerning for metastatic disease. 5. Few rounded subpleural nodule suspicious for further metastatic disease are present as well. 6. The moderate right and trace left pleural effusions. Malignant effusion is highly likely though no pleural thickening or enhancement is seen. These results were called by telephone at the time of interpretation on 01/28/2019 at 12:36 am to provider Dr VAlfred Levins, who verbally acknowledged these results. Electronically Signed   By: PLovena LeM.D.   On: 01/28/2019 00:37   Ct Abdomen Pelvis W Contrast  Result  Date: 01/18/2019 CLINICAL DATA:  Breast cancer.  Left lower back pain. EXAM: CT ABDOMEN AND PELVIS WITH CONTRAST  TECHNIQUE: Multidetector CT imaging of the abdomen and pelvis was performed using the standard protocol following bolus administration of intravenous contrast. CONTRAST:  80m OMNIPAQUE IOHEXOL 300 MG/ML  SOLN COMPARISON:  None. FINDINGS: Lower chest: Small right pleural effusion. Lung bases clear. Heart is normal size. Hepatobiliary: Numerous low-density lesions throughout the liver compatible with metastases. Index lesion in the posterior right hepatic lobe measures 2.1 cm. Gallbladder unremarkable. Pancreas: No focal abnormality or ductal dilatation. Spleen: No focal abnormality.  Normal size. Adrenals/Urinary Tract: No adrenal abnormality. No focal renal abnormality. No stones or hydronephrosis. Urinary bladder is unremarkable. Stomach/Bowel: Large stool burden in the cecum and ascending colon with mild gaseous distention of the right colon and transverse colon. No evidence of bowel obstruction. Stomach and small bowel grossly unremarkable. Vascular/Lymphatic: No evidence of aneurysm or adenopathy. Reproductive: Uterus and adnexa unremarkable.  No mass. Other: No free fluid or free air. Musculoskeletal: Sclerotic lesions throughout the bony pelvis as well as lumbar spine and lower thoracic spine compatible with sclerotic metastases. IMPRESSION: Numerous ill-defined low-density lesions throughout the liver compatible with metastases. Numerous rounded sclerotic lesions throughout the visualized thoracolumbar spine and pelvis compatible with metastatic disease. Large stool burden and mild gaseous distention of the right colon. Electronically Signed   By: KRolm BaptiseM.D.   On: 01/18/2019 19:58    PERFORMANCE STATUS (ECOG) : 1 - Symptomatic but completely ambulatory  Review of Systems Unless otherwise noted, a complete review of systems is negative.  Physical Exam General: NAD, frail appearing, thin Pulmonary: unlabored Extremities: no edema, no joint deformities Skin: no rashes Neurological: Weakness  but otherwise nonfocal  IMPRESSION: I met with patient in the ER to discuss goals.  She is pending admission to the hospital.  Patient has had intractable back pain.  Patient is known to have skeletal metastases.  Medical oncology is recommending spinal MRI for better characterization.  Patient has received both morphine and oxycodone.  She finds the morphine to be short-lived and reports better efficacy with the use of oxycodone.  Patient is also receiving dexamethasone and as needed Toradol.  Would recommend adding a long-acting opioid such as OxyContin to better control her pain around-the-clock.  Suggest adding prophylactic bowel regimen given patient's reported history of constipation.  I had a lengthy conversation with patient regarding her goals.  Although multiple previous providers have discussed with her the nature of her stage IV cancer, patient does not seem to have comprehended or fully heard these conversations. Patient stated that she thought the mastectomy removed all of the cancer. I reiterated that stage IV cancer was not curable and treatment would be with palliative intent only. She verbalized an understanding that the cancer would ultimately cause her death. Patient said that she was fearful of chemotherapy and attributed previous doses to causing constipation. She also stated that it was difficult for her to come to appointments and continuously "keep getting bad news."   We discussed the options for future chemotherapy versus hospice. Patient said she was not interested in hospice at the present time and would like to pursue future treatment.   Patient lives at home with her four young children and disabled father. She has some support from her mother, whom she sees daily. I strongly encouraged involvement with [facility] to provide supportive services for her children.   We discussed code status. Patient said she needed time to  think about decisions. She would want her mother to be  her proxy decision maker if needed. Patient thinks that her mother would likely want patient to remain a full code. I explained that resuscitation would probably prove futile in the setting of terminal cancer.   Case and plan discussed with Dr. Janese Banks.  PLAN: -Continue current scope of treatment -Continue oxycodone, dexamethasone, and ketorolac.  -Recommend starting OxyContin 71m Q12H -Agree with MRI of the spine for better characterization of metastatic disease -Prophylactic bowel regimen -Chaplain consult for support and ACP planning -Suggest [facility] involvement -Will benefit from completion of a MOST form -Full code -Will plan to follow-up in the outpatient clinic   Time Total: 60 minutes  Visit consisted of counseling and education dealing with the complex and emotionally intense issues of symptom management and palliative care in the setting of serious and potentially life-threatening illness.Greater than 50%  of this time was spent counseling and coordinating care related to the above assessment and plan.  Signed by: JAltha Harm PhD, NP-C

## 2019-01-28 NOTE — Consult Note (Signed)
Hematology/Oncology Consult note [facility] [phone] [phone]  Patient Care Team: Bunnie Pion, FNP as PCP - General (Family Medicine) Rico Junker, RN as Registered Nurse Theodore Demark, RN as Registered Nurse   Name of the patient: [name]  [id number]  1985/07/07    Reason for consult: Metastatic triple negative breast cancer with bone, liver and lymph node metastases   Referring physician: Dr. Fritzi Mandes  Date of visit: 01/28/2019    History of presenting illness-patient is a 33 year old female with triple negative breast cancer with known bone metastases.  She last received AC chemotherapy on 12/03/2018.  At that time she was having worsening pain around her right breast wound and wished to proceed with surgical management for the same.  Since then patient has no showed multiple times in the outpatient cancer clinic and has not received any chemotherapy.  She recently underwent toilet mastectomy for palliation by Dr. Dahlia Byes on 01/23/2019.  Patient is yet to follow-up with me as an outpatient since her surgery.  She presented to the ER with worsening low back pain.She had a CT angio chest done in the ER yesterday which showed increasing bulky mediastinal hilar and axillary adenopathy as well as multiple nodules in the liver concerning for metastatic disease.  Subpleural nodules as well as moderate right and trace left pleural effusion was also seen.  MRI lumbar spine showed 1.5 cm T12 vertebral body lesion reflecting osseous metastases and numerous additional sclerotic metastases in the lower thoracic and lumbar spine.  Patient currently reports fatigue and ongoing pain  ECOG PS- 1  Pain scale- 3   Review of systems- Review of Systems  Constitutional: Positive for malaise/fatigue. Negative for chills, fever and weight loss.  HENT: Negative for congestion, ear discharge and nosebleeds.   Eyes: Negative for blurred  vision.  Respiratory: Negative for cough, hemoptysis, sputum production, shortness of breath and wheezing.   Cardiovascular: Negative for chest pain, palpitations, orthopnea and claudication.  Gastrointestinal: Negative for abdominal pain, blood in stool, constipation, diarrhea, heartburn, melena, nausea and vomiting.  Genitourinary: Negative for dysuria, flank pain, frequency, hematuria and urgency.  Musculoskeletal: Positive for back pain. Negative for joint pain and myalgias.  Skin: Negative for rash.  Neurological: Negative for dizziness, tingling, focal weakness, seizures, weakness and headaches.  Endo/Heme/Allergies: Does not bruise/bleed easily.  Psychiatric/Behavioral: Negative for depression and suicidal ideas. The patient does not have insomnia.     No Known Allergies  Patient Active Problem List   Diagnosis Date Noted   Intractable back pain 01/28/2019   Cancer associated pain    Metastatic breast cancer [facility])    Palliative care encounter    Goals of care, counseling/discussion 11/08/2018   Breast cancer ([place]) 11/07/2018   Iron deficiency anemia 11/05/2018   Anemia 04/28/2016   Status post tubal ligation 04/28/2016   Status post repeat low transverse cesarean section 04/24/2016   Back pain 01/17/2016     Past Medical History:  Diagnosis Date   Anemia    Breast cancer [facility])      Past Surgical History:  Procedure Laterality Date   BREAST BIOPSY Right 10/25/2018   Heart Clip, Pending path   BREAST BIOPSY Right 10/25/2018   Lymph node biopsy, Hydromarker (butterfly), path pending   CESAREAN SECTION     CESAREAN SECTION N/A 04/24/2016   Procedure: REPEAT CESAREAN SECTION WITH BILATERAL TUBALIGATION;  Surgeon: Brayton Mars, MD;  Location: [facility] ORS;  Service: Obstetrics;  Laterality: N/A;  Baby Boy born @ 18[birth year] Apgars:8/9 Weight: 8lb 9oz   PORTACATH PLACEMENT Left 11/05/2018   Procedure: INSERTION PORT-A-CATH;  Surgeon: Jules Husbands, MD;   Location: [facility] ORS;  Service: General;  Laterality: Left;   PRIMARY CLOSURE Right 01/23/2019   Procedure: CLOSURE OF RIGHT BREAST;  Surgeon: Wallace Going, DO;  Location: [facility] ORS;  Service: Plastics;  Laterality: Right;   SIMPLE MASTECTOMY WITH AXILLARY SENTINEL NODE BIOPSY Right 01/23/2019   Procedure: SIMPLE MASTECTOMY;  Surgeon: Jules Husbands, MD;  Location: [facility] ORS;  Service: General;  Laterality: Right;  combined surgery with Dr Baltazar Apo    Social History   Socioeconomic History   Marital status: Single    Spouse name: Not on file   Number of children: Not on file   Years of education: Not on file   Highest education level: Not on file  Occupational History   Not on file  Social Needs   Financial resource strain: Not on file   Food insecurity    Worry: Not on file    Inability: Not on file   Transportation needs    Medical: Not on file    Non-medical: Not on file  Tobacco Use   Smoking status: Current Every Day Smoker    Packs/day: 0.25    Years: 5.00    Pack years: 1.25    Types: Cigarettes   Smokeless tobacco: Never Used  Substance and Sexual Activity   Alcohol use: No   Drug use: No   Sexual activity: Yes  Lifestyle   Physical activity    Days per week: Not on file    Minutes per session: Not on file   Stress: Not on file  Relationships   Social connections    Talks on phone: Not on file    Gets together: Not on file    Attends religious service: Not on file    Active member of club or organization: Not on file    Attends meetings of clubs or organizations: Not on file    Relationship status: Not on file   Intimate partner violence    Fear of current or ex partner: Not on file    Emotionally abused: Not on file    Physically abused: Not on file    Forced sexual activity: Not on file  Other Topics Concern   Not on file  Social History Narrative   Not on file     Family History  Problem Relation Age of Onset    Hypertension Mother    Stroke Father    Cancer Maternal Grandmother    Diabetes Maternal Grandfather    Heart disease Maternal Grandfather    Hypertension Maternal Grandfather      Current Facility-Administered Medications:    acetaminophen (TYLENOL) tablet 650 mg, 650 mg, Oral, Q6H PRN, 650 mg at 01/28/19 1307 **OR** acetaminophen (TYLENOL) suppository 650 mg, 650 mg, Rectal, Q6H PRN, Mansy, Jan A, MD   dexamethasone (DECADRON) tablet 4 mg, 4 mg, Oral, Daily, Mansy, Jan A, MD, 4 mg at 01/28/19 1006   docusate sodium (COLACE) capsule 100 mg, 100 mg, Oral, BID, Fritzi Mandes, MD   enoxaparin (LOVENOX) injection 40 mg, 40 mg, Subcutaneous, Q24H, Mansy, Jan A, MD   gabapentin (NEURONTIN) capsule 300 mg, 300 mg, Oral, TID, Mansy, Jan A, MD, 300 mg at 01/28/19 1007   ketorolac (TORADOL) 30 MG/ML injection 30 mg, 30 mg, Intravenous, Q6H PRN, Mansy, Jan A, MD   lidocaine-prilocaine (EMLA) cream 1 application,  1 application, Topical, UD, Mansy, Jan A, MD   LORazepam (ATIVAN) tablet 0.5 mg, 0.5 mg, Oral, Q6H PRN, Mansy, Jan A, MD   magnesium hydroxide (MILK OF MAGNESIA) suspension 30 mL, 30 mL, Oral, Daily PRN, Mansy, Jan A, MD   morphine 2 MG/ML injection 2 mg, 2 mg, Intravenous, Q4H PRN, Mansy, Jan A, MD, 2 mg at 01/28/19 1333   OLANZapine (ZYPREXA) tablet 10 mg, 10 mg, Oral, QHS, Mansy, Jan A, MD   ondansetron (ZOFRAN-ODT) disintegrating tablet 4 mg, 4 mg, Oral, Q6H PRN **OR** ondansetron (ZOFRAN) injection 4 mg, 4 mg, Intravenous, Q6H PRN, Mansy, Jan A, MD   ondansetron [facility]) tablet 8 mg, 8 mg, Oral, Q8H PRN, Mansy, Jan A, MD   oxyCODONE (Oxy IR/ROXICODONE) immediate release tablet 10 mg, 10 mg, Oral, Q4H PRN, Mansy, Jan A, MD, 10 mg at 01/28/19 1039   polyethylene glycol (MIRALAX / GLYCOLAX) packet 17 g, 17 g, Oral, Daily, Fritzi Mandes, MD   potassium chloride (KLOR-CON) packet 40 mEq, 40 mEq, Oral, Once, Mansy, Jan A, MD   prochlorperazine (COMPAZINE) tablet 10 mg, 10 mg,  Oral, Q6H PRN, Mansy, Jan A, MD   traZODone (DESYREL) tablet 25 mg, 25 mg, Oral, QHS PRN, Mansy, Arvella Merles, MD  Current Outpatient Medications:    dexamethasone (DECADRON) 4 MG tablet, Take 2 tablets by mouth once a day on the day after chemotherapy and then take 2 tablets two times a day for 2 days. Take with food., Disp: 30 tablet, Rfl: 1   gabapentin (NEURONTIN) 300 MG capsule, Take 1 capsule (300 mg total) by mouth 3 (three) times daily., Disp: 90 capsule, Rfl: 0   lidocaine-prilocaine (EMLA) cream, Apply 1 application topically as directed. Apply small amount over port site 1 hour before treatments, cover the cream with saran wrap to protect your clothing, Disp: 30 g, Rfl: 0   LORazepam (ATIVAN) 0.5 MG tablet, Take 1 tablet (0.5 mg total) by mouth every 6 (six) hours as needed (Nausea or vomiting)., Disp: 30 tablet, Rfl: 0   OLANZapine (ZYPREXA) 10 MG tablet, Take 1 tablet (10 mg total) by mouth at bedtime. Take a tablet day 2 through day 5 after each chemo treatment, Disp: 30 tablet, Rfl: 0   ondansetron (ZOFRAN) 8 MG tablet, Take 1 tablet (8 mg total) by mouth 2 (two) times daily as needed. Start on the third day after chemotherapy., Disp: 30 tablet, Rfl: 1   Oxycodone HCl 10 MG TABS, Take 1 tablet (10 mg total) by mouth every 4 (four) hours as needed for up to 15 days., Disp: 25 tablet, Rfl: 0   prochlorperazine (COMPAZINE) 10 MG tablet, Take 1 tablet (10 mg total) by mouth every 6 (six) hours as needed (Nausea or vomiting)., Disp: 30 tablet, Rfl: 1   naloxegol oxalate (MOVANTIK) 25 MG TABS tablet, Take 1 tablet (25 mg total) by mouth daily., Disp: 30 tablet, Rfl: 2   Physical exam:  Vitals:   01/28/19 0030 01/28/19 0215 01/28/19 0558 01/28/19 0810  BP:  (!) 140/95 (!) 172/100 (!) 138/91  Pulse: 81  80 86  Resp:  '18 16 16  '$ Temp:   98.2 F (36.8 C) 98.5 F (36.9 C)  TempSrc:   Oral Oral  SpO2: 99% 100% 100% 97%  Weight:      Height:       Physical Exam Constitutional:       Comments: Patient appears thin and fatigued  HENT:     Head: Normocephalic and atraumatic.  Eyes:  Pupils: Pupils are equal, round, and reactive to light.  Neck:     Musculoskeletal: Normal range of motion.  Cardiovascular:     Rate and Rhythm: Normal rate and regular rhythm.     Heart sounds: Normal heart sounds.  Pulmonary:     Effort: Pulmonary effort is normal.     Breath sounds: Normal breath sounds.     Comments: Breath sounds decreased over bilateral lung bases Abdominal:     General: Bowel sounds are normal.     Palpations: Abdomen is soft.  Skin:    General: Skin is warm and dry.  Neurological:     Mental Status: She is alert and oriented to person, place, and time.   Patient is s/p right mastectomy with a surgical drain in place    CMP Latest Ref Rng & Units 01/28/2019  Glucose 70 - 99 mg/dL 102(H)  BUN 6 - 20 mg/dL 6  Creatinine 0.44 - 1.00 mg/dL 0.53  Sodium 135 - 145 mmol/L 135  Potassium 3.5 - 5.1 mmol/L 4.0  Chloride 98 - 111 mmol/L 97(L)  CO2 22 - 32 mmol/L 25  Calcium 8.9 - 10.3 mg/dL 8.7(L)  Total Protein 6.5 - 8.1 g/dL -  Total Bilirubin 0.3 - 1.2 mg/dL -  Alkaline Phos 38 - 126 U/L -  AST 15 - 41 U/L -  ALT 0 - 44 U/L -   CBC Latest Ref Rng & Units 01/28/2019  WBC 4.0 - 10.5 K/uL 14.9(H)  Hemoglobin 12.0 - 15.0 g/dL 8.2(L)  Hematocrit 36.0 - 46.0 % 24.5(L)  Platelets 150 - 400 K/uL 465(H)    '@IMAGES'$ @  Dg Chest 2 View  Result Date: 01/27/2019 CLINICAL DATA:  Right-sided chest pain status post mastectomy EXAM: CHEST - 2 VIEW COMPARISON:  11/05/2018 FINDINGS: Left-sided central venous catheter with tip over the proximal right atrium. Drainage catheter over the right inframammary region. Small right greater than left pleural effusion. Mild airspace disease at the right base. Bilateral hilar fullness, possible nodes. Perihilar interstitial opacity. No pneumothorax. IMPRESSION: 1. Small bilateral pleural effusions, right greater than left with  atelectasis or mild infiltrate at the right base. 2. Increased perihilar interstitial opacity which may reflect atypical infection, less likely edema, or possible interstitial spread of disease. 3. Bilateral hilar fullness, cannot exclude hilar nodes. Electronically Signed   By: Donavan Foil M.D.   On: 01/27/2019 22:15   Ct Angio Chest Pe W And/or Wo Contrast  Result Date: 01/28/2019 CLINICAL DATA:  Right upper back and rib pain, increasing drainage from mastectomy site, procedure performed 01/23/2019 EXAM: CT ANGIOGRAPHY CHEST WITH CONTRAST TECHNIQUE: Multidetector CT imaging of the chest was performed using the standard protocol during bolus administration of intravenous contrast. Multiplanar CT image reconstructions and MIPs were obtained to evaluate the vascular anatomy. CONTRAST:  24m OMNIPAQUE IOHEXOL 350 MG/ML SOLN COMPARISON:  PET CT November 04, 2018 FINDINGS: Cardiovascular: Satisfactory opacification the pulmonary arteries to the segmental level. No pulmonary artery filling defects are identified. Central pulmonary arteries are normal caliber. No elevation of the RV/LV ratio (0.7). Left subclavian approach Port-A-Cath tip terminates at the cavoatrial junction. Normal cardiac size. Trace pericardial fluid. Thoracic aorta is normal caliber. Normal 3 vessel branching of the aortic arch. No luminal irregularity is seen. Mediastinum/Nodes: There is increasing bulky mediastinal, hilar and axillary adenopathy, much of which was previously FDG avid on comparison PET-CT. Reference lymph nodes include a right axillary lymph node measuring 18 mm, previously 14 mm (4/34). A 13 mm left axillary  lymph node not clearly evident on comparison study (4/29). A 15 mm right hilar lymph node, measuring approximately 12 mm previously (4/44). And a 12 mm AP window lymph node, not clearly present on comparison (4/37). No acute abnormality of the trachea or esophagus. Lungs/Pleura: There is a moderate right pleural effusion  and trace left effusion. No abnormal pleural thickening or enhancement adjacent areas of passive atelectasis are noted. Some interlobular septal thickening is present as well. There are several scattered subpleural nodules, largest measuring up to 6 mm in size (6/60). Upper Abdomen: Numerous hypoattenuating nodules throughout the liver, not clearly evident on comparison PET-CT. Musculoskeletal: There are postsurgical changes from recent right mastectomy and right axillary nodal dissection with surgical clips. There is soft tissue gas and stranding adjacent the right pectoralis musculature with a a surgical soft tissue drain coursing from the right chest wall and terminating just to the left of midline. Axillary fluid in stranding is noted as well (4/49). There is some abnormal hypoattenuation in the right pectoralis musculature (4/56) nonspecific given recent postoperative state but an intramuscular hematoma, abscess or breast mass is not excluded. Review of the MIP images confirms the above findings. IMPRESSION: 1. No evidence of pulmonary embolism. 2. Postsurgical changes from recent right mastectomy and right axillary nodal dissection with soft tissue gas and stranding adjacent to the right pectoralis musculature with a surgical soft tissue drain coursing from the right chest wall and terminating just to the left of midline. There is some abnormal hypoattenuation in the medial right pectoralis musculature, nonspecific given recent postoperative state and proximity to the primary breast lesion, but an intramuscular hematoma, abscess or breast mass is not excluded. 3. Numerous hypoattenuating nodules throughout the liver, not clearly evident on comparison PET-CT, concerning for metastatic disease. 4. Increased bulky mediastinal, hilar and axillary adenopathy, much of which was previously FDG avid on comparison PET-CT, concerning for metastatic disease. 5. Few rounded subpleural nodule suspicious for further  metastatic disease are present as well. 6. The moderate right and trace left pleural effusions. Malignant effusion is highly likely though no pleural thickening or enhancement is seen. These results were called by telephone at the time of interpretation on 01/28/2019 at 12:36 am to provider Dr Alfred Levins , who verbally acknowledged these results. Electronically Signed   By: Lovena Le M.D.   On: 01/28/2019 00:37   Mr Lumbar Spine Wo Contrast  Result Date: 01/28/2019 CLINICAL DATA:  Back pain, greater than 6 weeks conservative treatment, persistent symptoms additional history provided: 33 year old female with known history of metastatic breast cancer status post chemotherapy. Acute onset intractable left upper back pain EXAM: MRI LUMBAR SPINE WITHOUT CONTRAST TECHNIQUE: Multiplanar, multisequence MR imaging of the lumbar spine was performed. No intravenous contrast was administered. COMPARISON:  CT abdomen/pelvis 01/18/2019. FINDINGS: Multiple sequences are mildly motion degraded. Segmentation:  5 lumbar vertebrae Alignment: Straightening of the expected lumbar lordosis. No significant spondylolisthesis. Vertebrae: There is diffuse abnormal T1 hypointense marrow signal. 1.5 cm T2/STIR hyperintense lesion within the T12 vertebral body suspicious for a metastatic lesion. Multifocal sclerotic metastases within the lower thoracic and lumbar spine as well as bony pelvis were otherwise better appreciated on recent prior CT abdomen/pelvis 01/18/2019. Vertebral body height is maintained. Conus medullaris and cauda equina: Conus extends to the L1 level. No signal abnormality within the visualized distal spinal cord. Paraspinal and other soft tissues: Multiple liver metastases, partially imaged. No other abnormality identified within included portions of the abdomen/retroperitoneum. Paraspinal soft tissues within normal limits. Disc levels:  Intervertebral disc height is preserved. There is multilevel mild to moderate  facet hypertrophy. Facet hypertrophy results in mild left L4-L5 subarticular narrowing without nerve root impingement. No significant disc herniation, central canal stenosis or neural foraminal narrowing at any level. IMPRESSION: 1.5 cm T12 vertebral body lesion likely reflecting and osseous metastasis. Numerous additional sclerotic metastases within the lower thoracic and lumbar spine as well as bony pelvis were better appreciated on CT abdomen/pelvis 01/18/2019. No compression fracture. Mild lumbar spondylosis predominantly consisting of facet arthropathy. Mild L4-L5 left subarticular narrowing without nerve root impingement. No significant central canal stenosis or neural foraminal narrowing at any level. Diffuse abnormal T1 hypointense marrow signal which is nonspecific, but likely reflects red marrow reconversion given provided history. Multiple partially imaged liver metastases. Electronically Signed   By: Kellie Simmering DO   On: 01/28/2019 14:40   Ct Abdomen Pelvis W Contrast  Result Date: 01/18/2019 CLINICAL DATA:  Breast cancer.  Left lower back pain. EXAM: CT ABDOMEN AND PELVIS WITH CONTRAST TECHNIQUE: Multidetector CT imaging of the abdomen and pelvis was performed using the standard protocol following bolus administration of intravenous contrast. CONTRAST:  41m OMNIPAQUE IOHEXOL 300 MG/ML  SOLN COMPARISON:  None. FINDINGS: Lower chest: Small right pleural effusion. Lung bases clear. Heart is normal size. Hepatobiliary: Numerous low-density lesions throughout the liver compatible with metastases. Index lesion in the posterior right hepatic lobe measures 2.1 cm. Gallbladder unremarkable. Pancreas: No focal abnormality or ductal dilatation. Spleen: No focal abnormality.  Normal size. Adrenals/Urinary Tract: No adrenal abnormality. No focal renal abnormality. No stones or hydronephrosis. Urinary bladder is unremarkable. Stomach/Bowel: Large stool burden in the cecum and ascending colon with mild gaseous  distention of the right colon and transverse colon. No evidence of bowel obstruction. Stomach and small bowel grossly unremarkable. Vascular/Lymphatic: No evidence of aneurysm or adenopathy. Reproductive: Uterus and adnexa unremarkable.  No mass. Other: No free fluid or free air. Musculoskeletal: Sclerotic lesions throughout the bony pelvis as well as lumbar spine and lower thoracic spine compatible with sclerotic metastases. IMPRESSION: Numerous ill-defined low-density lesions throughout the liver compatible with metastases. Numerous rounded sclerotic lesions throughout the visualized thoracolumbar spine and pelvis compatible with metastatic disease. Large stool burden and mild gaseous distention of the right colon. Electronically Signed   By: KRolm BaptiseM.D.   On: 01/18/2019 19:58    Assessment and plan- Patient is a 33y.o. female with stage IV triple negative breast cancer with known bone metastases now presenting with worsening mediastinal and hilar adenopathy as well as liver metastases  1.  Again discussed with the patient that the toilet mastectomy was done purely for palliation and patient comfort and that still does not address her systemic disease.  Overall patient's prognosis is poor and chemotherapy would still only be palliative and not curative.  Triple negative breast cancer has aggressive course and as such she needs to resume systemic chemotherapy for better control of her disease.  Presently patient is recovering from her mastectomy and will be seeing Dr. PDahlia Byesnext week to assess wound healing.  I will tentatively see her back 3 weeks from now to restart palliative chemotherapy with carboplatin and Taxol given weekly until progression or toxicity.  2.  Neoplasm related pain: Okay to restart all as needed oxycodone as well as OxyContin.  If patient is discharged over Thanksgiving holiday please give her a prescription for 3 to 4 days worth of pain medicine that will take her through  Monday and I will refill OxyContin  and oxycodone subsequently starting Monday until she can see me in the outpatient clinic.  I have clearly mentioned to the patient that I would like to help her manage her pain but she needs to be compliant and show up for her outpatient appointments  3.  Opioid-induced constipation: We will work on getting outpatient [place] authorized.  She can continue taking MiraLAX and senna at this time for constipation  4.  Pleural effusion: Likely malignant.  Patient is currently not symptomatic and I will hold off on thoracentesis for now  5.  Microcytic anemia: Labs suggestive of anemia of chronic disease from underlying malignancy.  Continue to monitor  6.  Goals of care counseling/discussion: Patient has also met with NP Altha Harm and presently she wishes to continue aggressive plan of care and restart chemotherapy.  She wishes to be a full code    Visit Diagnosis 1. Metastatic breast cancer ([place])   2. Cancer associated pain   3. Malignant pleural effusion   4. Dyspnea     Dr. Randa Evens, MD, MPH [facility] at [facility] [phone] 01/28/2019 3:15 PM

## 2019-01-28 NOTE — H&P (Addendum)
[facility] NAME: [name]    MR#:  [id number]  DATE OF BIRTH:  October 21, 1985  DATE OF ADMISSION:  01/27/2019  PRIMARY CARE PHYSICIAN: Bunnie Pion, FNP   REQUESTING/REFERRING PHYSICIAN: Gonzella Lex, MD  CHIEF COMPLAINT:   Chief Complaint  Patient presents with   Back Pain   possible clogged drain    HISTORY OF PRESENT ILLNESS:  [name]  is a 33 y.o. African American female with a known history of metastatic breast cancer, status post chemotherapy and recent right total mastectomy, who presented to the emergency room with acute onset of intractable left upper back pain.  With recent right mastectomy on 11/19.  She has been having mild dyspnea on occasional dry cough.  No wheezing or hemoptysis.  She denies any dysuria, oliguria or hematuria or flank pain.  No headache or dizziness or blurred vision.  No chest pain or palpitations.  No exposure to COVID-19.  The patient noticed that the right JP drain output has been decreasing and was concerned if it was clogged.  Upon presentation to the emergency room, blood pressure was 152/94 with otherwise normal vital signs.  Labs revealed mild hypokalemia of 3.4 and elevated alk phos of 161 with albumin of 2.8 AST 46.  CBC showed leukocytosis of 14.3 as well as anemia with hemoglobin of 7.8 and hematocrit 24 compared to 9 and 27.6 on 11/19.  IT showed neutrophilia.  Chest x-ray showed small bilateral pleural effusions right greater than the left with atelectasis or mild infiltrate in the right base and increased perihilar interstitial opacity that may reflect atypical infection, less likely edema or possibly interstitial spread of disease.  It showed bilateral hilar fullness cannot exclude hilar nodes.  Chest CTA revealed no evidence for PE.  It showed postsurgical changes from right mastectomy and right axillary node dissection.  There were numerous nodules throughout the liver concerning  for metastatic disease.  There were increased bulky mediastinal, hilar and axillary adenopathy and few rounded subpleural nodules suspicious for further metastatic disease.  There was moderate right and trace left pleural effusions with no pleural thickening.  The patient was given 4 mg of IV morphine sulfate twice and 4 mg of IV Zofran.  She will be admitted to the medical bed for further evaluation and management. PAST MEDICAL HISTORY:   Past Medical History:  Diagnosis Date   Anemia    Breast cancer ([place])     PAST SURGICAL HISTORY:   Past Surgical History:  Procedure Laterality Date   BREAST BIOPSY Right 10/25/2018   Heart Clip, Pending path   BREAST BIOPSY Right 10/25/2018   Lymph node biopsy, Hydromarker (butterfly), path pending   CESAREAN SECTION     CESAREAN SECTION N/A 04/24/2016   Procedure: REPEAT CESAREAN SECTION WITH BILATERAL TUBALIGATION;  Surgeon: Brayton Mars, MD;  Location: [facility] ORS;  Service: Obstetrics;  Laterality: N/A;  Baby Boy born @ 7 Apgars:8/9 Weight: 8lb 9oz   PORTACATH PLACEMENT Left 11/05/2018   Procedure: INSERTION PORT-A-CATH;  Surgeon: Jules Husbands, MD;  Location: [facility] ORS;  Service: General;  Laterality: Left;   PRIMARY CLOSURE Right 01/23/2019   Procedure: CLOSURE OF RIGHT BREAST;  Surgeon: Wallace Going, DO;  Location: [facility] ORS;  Service: Plastics;  Laterality: Right;   SIMPLE MASTECTOMY WITH AXILLARY SENTINEL NODE BIOPSY Right 01/23/2019   Procedure: SIMPLE MASTECTOMY;  Surgeon: Jules Husbands, MD;  Location: [facility] ORS;  Service: General;  Laterality: Right;  combined surgery with Dr Baltazar Apo    SOCIAL HISTORY:   Social History   Tobacco Use   Smoking status: Current Every Day Smoker    Packs/day: 0.25    Years: 5.00    Pack years: 1.25    Types: Cigarettes   Smokeless tobacco: Never Used  Substance Use Topics   Alcohol use: No    FAMILY HISTORY:   Family History  Problem Relation Age of Onset    Hypertension Mother    Stroke Father    Cancer Maternal Grandmother    Diabetes Maternal Grandfather    Heart disease Maternal Grandfather    Hypertension Maternal Grandfather     DRUG ALLERGIES:  No Known Allergies  REVIEW OF SYSTEMS:   ROS As per history of present illness. All pertinent systems were reviewed above. Constitutional,  HEENT, cardiovascular, respiratory, GI, GU, musculoskeletal, neuro, psychiatric, endocrine,  integumentary and hematologic systems were reviewed and are otherwise  negative/unremarkable except for positive findings mentioned above in the HPI.   MEDICATIONS AT HOME:   Prior to Admission medications   Medication Sig Start Date End Date Taking? Authorizing Provider  dexamethasone (DECADRON) 4 MG tablet Take 2 tablets by mouth once a day on the day after chemotherapy and then take 2 tablets two times a day for 2 days. Take with food. 11/07/18  Yes Sindy Guadeloupe, MD  gabapentin (NEURONTIN) 300 MG capsule Take 1 capsule (300 mg total) by mouth 3 (three) times daily. 11/04/18  Yes Pabon, Daniel, MD  lidocaine-prilocaine (EMLA) cream Apply 1 application topically as directed. Apply small amount over port site 1 hour before treatments, cover the cream with saran wrap to protect your clothing 01/13/19  Yes Sindy Guadeloupe, MD  LORazepam (ATIVAN) 0.5 MG tablet Take 1 tablet (0.5 mg total) by mouth every 6 (six) hours as needed (Nausea or vomiting). 11/07/18  Yes Sindy Guadeloupe, MD  OLANZapine (ZYPREXA) 10 MG tablet Take 1 tablet (10 mg total) by mouth at bedtime. Take a tablet day 2 through day 5 after each chemo treatment 12/03/18  Yes Sindy Guadeloupe, MD  ondansetron (ZOFRAN) 8 MG tablet Take 1 tablet (8 mg total) by mouth 2 (two) times daily as needed. Start on the third day after chemotherapy. 11/07/18  Yes Sindy Guadeloupe, MD  Oxycodone HCl 10 MG TABS Take 1 tablet (10 mg total) by mouth every 4 (four) hours as needed for up to 15 days. 01/24/19 02/08/19 Yes Tylene Fantasia, PA-C  prochlorperazine (COMPAZINE) 10 MG tablet Take 1 tablet (10 mg total) by mouth every 6 (six) hours as needed (Nausea or vomiting). 11/07/18  Yes Sindy Guadeloupe, MD      VITAL SIGNS:  Blood pressure (!) 141/87, pulse 81, temperature 98.4 F (36.9 C), temperature source Oral, resp. rate 16, height '5\' 5"'$  (1.651 m), weight 54.6 kg, last menstrual period 11/27/2018, SpO2 99 %.  PHYSICAL EXAMINATION:  Physical Exam  GENERAL:  33 y.o.-year-old cachectic African-American female patient lying in the bed with no acute distress.  EYES: Pupils equal, round, reactive to light and accommodation. No scleral icterus. Extraocular muscles intact.  HEENT: Head atraumatic, normocephalic. Oropharynx with slightly dry mucous membrane and tongue and nasopharynx clear.  NECK:  Supple, no jugular venous distention. No thyroid enlargement, no tenderness.  LUNGS: Diminished bibasilar sounds. CARDIOVASCULAR: Regular rate and rhythm, S1, S2 normal. No murmurs, rubs, or gallops.  ABDOMEN: Soft, nondistended, nontender. Bowel sounds present. No organomegaly or mass.  EXTREMITIES:  No pedal edema, cyanosis, or clubbing.  NEUROLOGIC: Cranial nerves II through XII are intact. Muscle strength 5/5 in all extremities. Sensation intact. Gait not checked.  PSYCHIATRIC: The patient is alert and oriented x 3.  Normal affect and good eye contact. SKIN: No obvious rash, lesion, or ulcer.   LABORATORY PANEL:   CBC Recent Labs  Lab 01/27/19 2303  WBC 14.3*  HGB 7.8*  HCT 24.0*  PLT 462*   ------------------------------------------------------------------------------------------------------------------  Chemistries  Recent Labs  Lab 01/27/19 2303  NA 138  K 3.4*  CL 98  CO2 26  GLUCOSE 107*  BUN 8  CREATININE 0.64  CALCIUM 8.9  AST 46*  ALT 33  ALKPHOS 161*  BILITOT 0.3    ------------------------------------------------------------------------------------------------------------------  Cardiac Enzymes No results for input(s): TROPONINI in the last 168 hours. ------------------------------------------------------------------------------------------------------------------  RADIOLOGY:  Dg Chest 2 View  Result Date: 01/27/2019 CLINICAL DATA:  Right-sided chest pain status post mastectomy EXAM: CHEST - 2 VIEW COMPARISON:  11/05/2018 FINDINGS: Left-sided central venous catheter with tip over the proximal right atrium. Drainage catheter over the right inframammary region. Small right greater than left pleural effusion. Mild airspace disease at the right base. Bilateral hilar fullness, possible nodes. Perihilar interstitial opacity. No pneumothorax. IMPRESSION: 1. Small bilateral pleural effusions, right greater than left with atelectasis or mild infiltrate at the right base. 2. Increased perihilar interstitial opacity which may reflect atypical infection, less likely edema, or possible interstitial spread of disease. 3. Bilateral hilar fullness, cannot exclude hilar nodes. Electronically Signed   By: Donavan Foil M.D.   On: 01/27/2019 22:15   Ct Angio Chest Pe W And/or Wo Contrast  Result Date: 01/28/2019 CLINICAL DATA:  Right upper back and rib pain, increasing drainage from mastectomy site, procedure performed 01/23/2019 EXAM: CT ANGIOGRAPHY CHEST WITH CONTRAST TECHNIQUE: Multidetector CT imaging of the chest was performed using the standard protocol during bolus administration of intravenous contrast. Multiplanar CT image reconstructions and MIPs were obtained to evaluate the vascular anatomy. CONTRAST:  72m OMNIPAQUE IOHEXOL 350 MG/ML SOLN COMPARISON:  PET CT November 04, 2018 FINDINGS: Cardiovascular: Satisfactory opacification the pulmonary arteries to the segmental level. No pulmonary artery filling defects are identified. Central pulmonary arteries are normal  caliber. No elevation of the RV/LV ratio (0.7). Left subclavian approach Port-A-Cath tip terminates at the cavoatrial junction. Normal cardiac size. Trace pericardial fluid. Thoracic aorta is normal caliber. Normal 3 vessel branching of the aortic arch. No luminal irregularity is seen. Mediastinum/Nodes: There is increasing bulky mediastinal, hilar and axillary adenopathy, much of which was previously FDG avid on comparison PET-CT. Reference lymph nodes include a right axillary lymph node measuring 18 mm, previously 14 mm (4/34). A 13 mm left axillary lymph node not clearly evident on comparison study (4/29). A 15 mm right hilar lymph node, measuring approximately 12 mm previously (4/44). And a 12 mm AP window lymph node, not clearly present on comparison (4/37). No acute abnormality of the trachea or esophagus. Lungs/Pleura: There is a moderate right pleural effusion and trace left effusion. No abnormal pleural thickening or enhancement adjacent areas of passive atelectasis are noted. Some interlobular septal thickening is present as well. There are several scattered subpleural nodules, largest measuring up to 6 mm in size (6/60). Upper Abdomen: Numerous hypoattenuating nodules throughout the liver, not clearly evident on comparison PET-CT. Musculoskeletal: There are postsurgical changes from recent right mastectomy and right axillary nodal dissection with surgical clips. There is soft tissue gas and stranding adjacent the right pectoralis musculature with a  a surgical soft tissue drain coursing from the right chest wall and terminating just to the left of midline. Axillary fluid in stranding is noted as well (4/49). There is some abnormal hypoattenuation in the right pectoralis musculature (4/56) nonspecific given recent postoperative state but an intramuscular hematoma, abscess or breast mass is not excluded. Review of the MIP images confirms the above findings. IMPRESSION: 1. No evidence of pulmonary embolism.  2. Postsurgical changes from recent right mastectomy and right axillary nodal dissection with soft tissue gas and stranding adjacent to the right pectoralis musculature with a surgical soft tissue drain coursing from the right chest wall and terminating just to the left of midline. There is some abnormal hypoattenuation in the medial right pectoralis musculature, nonspecific given recent postoperative state and proximity to the primary breast lesion, but an intramuscular hematoma, abscess or breast mass is not excluded. 3. Numerous hypoattenuating nodules throughout the liver, not clearly evident on comparison PET-CT, concerning for metastatic disease. 4. Increased bulky mediastinal, hilar and axillary adenopathy, much of which was previously FDG avid on comparison PET-CT, concerning for metastatic disease. 5. Few rounded subpleural nodule suspicious for further metastatic disease are present as well. 6. The moderate right and trace left pleural effusions. Malignant effusion is highly likely though no pleural thickening or enhancement is seen. These results were called by telephone at the time of interpretation on 01/28/2019 at 12:36 am to provider Dr Alfred Levins , who verbally acknowledged these results. Electronically Signed   By: Lovena Le M.D.   On: 01/28/2019 00:37      IMPRESSION AND PLAN:   1.  Metastatic breast cancer status post mastectomy with intractable upper back pain.  The patient will be admitted to medical bed.  Pain management to be provided with as needed IV Toradol and morphine sulfate in addition to oxycodone.  Oncology consultation as well as follow-up surgical consultation will be obtained.  I notified Dr. Janese Banks as well as Dr. Celine Ahr about the patient.  2.  Hypokalemia.  Potassium will be replaced and magnesium level will be checked.  3.  Leukocytosis.  This is likely secondary to steroid therapy and stress demargination.  Will follow CBC with differential.  4.  Bilateral pleural  effusions likely malignant, right greater than left.  Ultrasound-guided thoracentesis will be obtained and analyzed.  We will add BNP given recent chemotherapy.  5.  Worsening anemia.  This is likely related to chemotherapy.  Will obtain anemia work-up.  6.  DVT prophylaxis.  Subcutaneous low with negative stool Hemoccult.  All the records are reviewed and case discussed with ED provider. The plan of care was discussed in details with the patient I answered all questions. The patient agreed to proceed with the above mentioned plan. Further management will depend upon hospital course.   CODE STATUS: Full code  TOTAL TIME TAKING CARE OF THIS PATIENT: 55 minutes.    Christel Mormon M.D on 01/28/2019 at 1:58 AM  [facility]   From 7 PM-7 AM, contact night-coverage [email or web address]  CC: Primary care physician; Bunnie Pion, FNP   Note: This dictation was prepared with Dragon dictation along with smaller phrase technology. Any transcriptional errors that result from this process are unintentional.

## 2019-01-28 NOTE — ED Notes (Signed)
Patient given lunch tray.

## 2019-01-28 NOTE — ED Notes (Signed)
Patient is refusing to wear blood pressure cuff right now

## 2019-01-28 NOTE — ED Notes (Signed)
ED TO INPATIENT HANDOFF REPORT  ED Nurse Name and Phone #: Kathryne Hitch Name/Age/Gender [name] 33 y.o. female Room/Bed: ED37A/ED37A  Code Status   Code Status: Full Code  Home/SNF/Other Home Patient oriented to: self, place, time and situation Is this baseline? Yes   Triage Complete: Triage complete  Chief Complaint Post Op Problem  Triage Note Pt to ED reporting worsening upper right back and rib pain today. Pt has been having a full balloon from her mastectomy drain everyday since procedure on the 19th and is concerned because she has had very little drainage today. Wound was draining appropriately in the morning but throughout the afternoon has had no drainage. Wound site is not red or irritated but pt has been able to manage pain with 1000mg  of tylenol and today that has not been helping the pain. Pt appears very uncomfortable in triage. No SOB noted.    Allergies No Known Allergies  Level of Care/Admitting Diagnosis ED Disposition    ED Disposition Condition [facility] Area: [place] [[id number]]  Level of Care: Med-Surg [16]  Covid Evaluation: Asymptomatic Screening Protocol (No Symptoms)  Diagnosis: Intractable back pain [id number]  Admitting Physician: Christel Mormon [id number]  Attending Physician: Christel Mormon [id number]  Estimated length of stay: past midnight tomorrow  Certification:: I certify this patient will need inpatient services for at least 2 midnights  PT Class (Do Not Modify): Inpatient [101]  PT Acc Code (Do Not Modify): Private [1]       B Medical/Surgery History Past Medical History:  Diagnosis Date  . Anemia   . Breast cancer [facility])    Past Surgical History:  Procedure Laterality Date  . BREAST BIOPSY Right 10/25/2018   Heart Clip, Pending path  . BREAST BIOPSY Right 10/25/2018   Lymph node biopsy, Hydromarker (butterfly), path pending  . CESAREAN SECTION    . CESAREAN SECTION N/A 04/24/2016    Procedure: REPEAT CESAREAN SECTION WITH BILATERAL TUBALIGATION;  Surgeon: Brayton Mars, MD;  Location: [facility] ORS;  Service: Obstetrics;  Laterality: N/A;  Baby Boy born @ 27 Apgars:8/9 Weight: 8lb 9oz  . PORTACATH PLACEMENT Left 11/05/2018   Procedure: INSERTION PORT-A-CATH;  Surgeon: Jules Husbands, MD;  Location: [facility] ORS;  Service: General;  Laterality: Left;  . PRIMARY CLOSURE Right 01/23/2019   Procedure: CLOSURE OF RIGHT BREAST;  Surgeon: Wallace Going, DO;  Location: [facility] ORS;  Service: Plastics;  Laterality: Right;  . SIMPLE MASTECTOMY WITH AXILLARY SENTINEL NODE BIOPSY Right 01/23/2019   Procedure: SIMPLE MASTECTOMY;  Surgeon: Jules Husbands, MD;  Location: [facility] ORS;  Service: General;  Laterality: Right;  combined surgery with Dr Marney Doctor IV Location/Drains/Wounds Patient Lines/Drains/Airways Status   Active Line/Drains/Airways    Name:   Placement date:   Placement time:   Site:   Days:   Implanted Port 11/05/18 Left Chest   11/05/18    0936    Chest   84   Peripheral IV 01/27/19 Left Antecubital   01/27/19    2301    Antecubital   1   Closed System Drain 1 Right Breast Bulb (JP) 19 Fr.   01/23/19    0901    Breast   5   Incision (Closed) 04/24/16 Vagina   04/24/16    1906     1009   Incision (Closed) 04/24/16 Abdomen   04/24/16    1908  1009   Incision (Closed) 11/05/18 Chest Left   11/05/18    0949     84   Incision (Closed) 01/23/19 Chest Right   01/23/19    0914     5          Intake/Output Last 24 hours  Intake/Output Summary (Last 24 hours) at 01/28/2019 1857 Last data filed at 01/28/2019 1011 Gross per 24 hour  Intake 847.62 ml  Output 330 ml  Net 517.62 ml    Labs/Imaging Results for orders placed or performed during the hospital encounter of 01/27/19 (from the past 48 hour(s))  CBC with Differential     Status: Abnormal   Collection Time: 01/27/19 11:03 PM  Result Value Ref Range   WBC 14.3 (H) 4.0 - 10.5 K/uL   RBC 3.05 (L)  3.87 - 5.11 MIL/uL   Hemoglobin 7.8 (L) 12.0 - 15.0 g/dL   HCT 24.0 (L) 36.0 - 46.0 %   MCV 78.7 (L) 80.0 - 100.0 fL   MCH 25.6 (L) 26.0 - 34.0 pg   MCHC 32.5 30.0 - 36.0 g/dL   RDW 23.7 (H) 11.5 - 15.5 %   Platelets 462 (H) 150 - 400 K/uL   nRBC 0.8 (H) 0.0 - 0.2 %   Neutrophils Relative % 73 %   Neutro Abs 10.4 (H) 1.7 - 7.7 K/uL   Lymphocytes Relative 12 %   Lymphs Abs 1.7 0.7 - 4.0 K/uL   Monocytes Relative 7 %   Monocytes Absolute 1.0 0.1 - 1.0 K/uL   Eosinophils Relative 2 %   Eosinophils Absolute 0.2 0.0 - 0.5 K/uL   Basophils Relative 0 %   Basophils Absolute 0.1 0.0 - 0.1 K/uL   WBC Morphology MILD LEFT SHIFT (1-5% METAS, OCC MYELO, OCC BANDS)    Immature Granulocytes 6 %   Abs Immature Granulocytes 0.85 (H) 0.00 - 0.07 K/uL   Polychromasia PRESENT    Target Cells PRESENT     Comment: Performed at [facility], [address] [place], [place] [address]  Comprehensive metabolic panel     Status: Abnormal   Collection Time: 01/27/19 11:03 PM  Result Value Ref Range   Sodium 138 135 - 145 mmol/L   Potassium 3.4 (L) 3.5 - 5.1 mmol/L   Chloride 98 98 - 111 mmol/L   CO2 26 22 - 32 mmol/L   Glucose, Bld 107 (H) 70 - 99 mg/dL   BUN 8 6 - 20 mg/dL   Creatinine, Ser 0.64 0.44 - 1.00 mg/dL   Calcium 8.9 8.9 - 10.3 mg/dL   Total Protein 6.6 6.5 - 8.1 g/dL   Albumin 2.8 (L) 3.5 - 5.0 g/dL   AST 46 (H) 15 - 41 U/L   ALT 33 0 - 44 U/L   Alkaline Phosphatase 161 (H) 38 - 126 U/L   Total Bilirubin 0.3 0.3 - 1.2 mg/dL   GFR calc non Af Amer >60 >60 mL/min   GFR calc Af Amer >60 >60 mL/min   Anion gap 14 5 - 15    Comment: Performed at [facility], [address] [place], [place] [address]  SARS CORONAVIRUS 2 (TAT 6-24 HRS) Nasopharyngeal Nasopharyngeal Swab     Status: None   Collection Time: 01/28/19  1:34 AM   Specimen: Nasopharyngeal Swab  Result Value Ref Range   SARS Coronavirus 2 NEGATIVE NEGATIVE    Comment: (NOTE) SARS-CoV-2 target nucleic  acids are NOT DETECTED. The SARS-CoV-2 RNA is generally detectable in upper  and lower respiratory specimens during the acute phase of infection. Negative results do not preclude SARS-CoV-2 infection, do not rule out co-infections with other pathogens, and should not be used as the sole basis for treatment or other patient management decisions. Negative results must be combined with clinical observations, patient history, and epidemiological information. The expected result is Negative. Fact Sheet for Patients: SugarRoll.be Fact Sheet for Healthcare Providers: https://www.woods-mathews.com/ This test is not yet approved or cleared by the [place] FDA and  has been authorized for detection and/or diagnosis of SARS-CoV-2 by FDA under an Emergency Use Authorization (EUA). This EUA will remain  in effect (meaning this test can be used) for the duration of the COVID-19 declaration under Section 56 4(b)(1) of the Act, 21 U.S.C. section 360bbb-3(b)(1), unless the authorization is terminated or revoked sooner. Performed at [facility], [place] [address], [place], [place] [id number]   Basic metabolic panel     Status: Abnormal   Collection Time: 01/28/19  5:13 AM  Result Value Ref Range   Sodium 135 135 - 145 mmol/L   Potassium 4.0 3.5 - 5.1 mmol/L   Chloride 97 (L) 98 - 111 mmol/L   CO2 25 22 - 32 mmol/L   Glucose, Bld 102 (H) 70 - 99 mg/dL   BUN 6 6 - 20 mg/dL   Creatinine, Ser 0.53 0.44 - 1.00 mg/dL   Calcium 8.7 (L) 8.9 - 10.3 mg/dL   GFR calc non Af Amer >60 >60 mL/min   GFR calc Af Amer >60 >60 mL/min   Anion gap 13 5 - 15    Comment: Performed at [facility], [address] [place], [place] [address]  CBC     Status: Abnormal   Collection Time: 01/28/19  5:13 AM  Result Value Ref Range   WBC 14.9 (H) 4.0 - 10.5 K/uL   RBC 3.25 (L) 3.87 - 5.11 MIL/uL   Hemoglobin 8.2 (L) 12.0 - 15.0 g/dL    Comment: Reticulocyte  Hemoglobin testing may be clinically indicated, consider ordering this additional test [id number]    HCT 24.5 (L) 36.0 - 46.0 %   MCV 75.4 (L) 80.0 - 100.0 fL   MCH 25.2 (L) 26.0 - 34.0 pg   MCHC 33.5 30.0 - 36.0 g/dL   RDW 23.7 (H) 11.5 - 15.5 %   Platelets 465 (H) 150 - 400 K/uL   nRBC 1.0 (H) 0.0 - 0.2 %    Comment: Performed at [facility], [address], [place], [place] [address]  Ferritin     Status: Abnormal   Collection Time: 01/28/19  5:13 AM  Result Value Ref Range   Ferritin 1,230 (H) 11 - 307 ng/mL    Comment: Performed at [facility], [address] [place], [place] [address]  Iron and TIBC     Status: Abnormal   Collection Time: 01/28/19  5:13 AM  Result Value Ref Range   Iron 26 (L) 28 - 170 ug/dL   TIBC 130 (L) 250 - 450 ug/dL   Saturation Ratios 20 10.4 - 31.8 %   UIBC 104 ug/dL    Comment: Performed at [facility], [address] [place], [place] [address]  Folate     Status: Abnormal   Collection Time: 01/28/19  5:13 AM  Result Value Ref Range   Folate 5.7 (L) >5.9 ng/mL    Comment: Performed at [facility], [address] [place], [place] [address]  Vitamin B12  Status: Abnormal   Collection Time: 01/28/19  5:13 AM  Result Value Ref Range   Vitamin B-12 2,928 (H) 180 - 914 pg/mL    Comment: (NOTE) This assay is not validated for testing neonatal or myeloproliferative syndrome specimens for Vitamin B12 levels. Performed at [facility], [place] [address], [place], [place] [address]   Lactate dehydrogenase     Status: Abnormal   Collection Time: 01/28/19  5:13 AM  Result Value Ref Range   LDH 534 (H) 98 - 192 U/L    Comment: Performed at [facility], [address] [place], [place] [address]  Magnesium     Status: None   Collection Time: 01/28/19  5:13 AM  Result Value Ref Range   Magnesium 2.1 1.7 - 2.4 mg/dL    Comment: Performed at [facility], [address]  [place], [place] [address]   Dg Chest 2 View  Result Date: 01/27/2019 CLINICAL DATA:  Right-sided chest pain status post mastectomy EXAM: CHEST - 2 VIEW COMPARISON:  11/05/2018 FINDINGS: Left-sided central venous catheter with tip over the proximal right atrium. Drainage catheter over the right inframammary region. Small right greater than left pleural effusion. Mild airspace disease at the right base. Bilateral hilar fullness, possible nodes. Perihilar interstitial opacity. No pneumothorax. IMPRESSION: 1. Small bilateral pleural effusions, right greater than left with atelectasis or mild infiltrate at the right base. 2. Increased perihilar interstitial opacity which may reflect atypical infection, less likely edema, or possible interstitial spread of disease. 3. Bilateral hilar fullness, cannot exclude hilar nodes. Electronically Signed   By: Donavan Foil M.D.   On: 01/27/2019 22:15   Ct Angio Chest Pe W And/or Wo Contrast  Result Date: 01/28/2019 CLINICAL DATA:  Right upper back and rib pain, increasing drainage from mastectomy site, procedure performed 01/23/2019 EXAM: CT ANGIOGRAPHY CHEST WITH CONTRAST TECHNIQUE: Multidetector CT imaging of the chest was performed using the standard protocol during bolus administration of intravenous contrast. Multiplanar CT image reconstructions and MIPs were obtained to evaluate the vascular anatomy. CONTRAST:  56mL OMNIPAQUE IOHEXOL 350 MG/ML SOLN COMPARISON:  PET CT November 04, 2018 FINDINGS: Cardiovascular: Satisfactory opacification the pulmonary arteries to the segmental level. No pulmonary artery filling defects are identified. Central pulmonary arteries are normal caliber. No elevation of the RV/LV ratio (0.7). Left subclavian approach Port-A-Cath tip terminates at the cavoatrial junction. Normal cardiac size. Trace pericardial fluid. Thoracic aorta is normal caliber. Normal 3 vessel branching of the aortic arch. No luminal irregularity is seen. Mediastinum/Nodes:  There is increasing bulky mediastinal, hilar and axillary adenopathy, much of which was previously FDG avid on comparison PET-CT. Reference lymph nodes include a right axillary lymph node measuring 18 mm, previously 14 mm (4/34). A 13 mm left axillary lymph node not clearly evident on comparison study (4/29). A 15 mm right hilar lymph node, measuring approximately 12 mm previously (4/44). And a 12 mm AP window lymph node, not clearly present on comparison (4/37). No acute abnormality of the trachea or esophagus. Lungs/Pleura: There is a moderate right pleural effusion and trace left effusion. No abnormal pleural thickening or enhancement adjacent areas of passive atelectasis are noted. Some interlobular septal thickening is present as well. There are several scattered subpleural nodules, largest measuring up to 6 mm in size (6/60). Upper Abdomen: Numerous hypoattenuating nodules throughout the liver, not clearly evident on comparison PET-CT. Musculoskeletal: There are postsurgical changes from recent right mastectomy and right axillary nodal dissection with surgical clips. There is soft  tissue gas and stranding adjacent the right pectoralis musculature with a a surgical soft tissue drain coursing from the right chest wall and terminating just to the left of midline. Axillary fluid in stranding is noted as well (4/49). There is some abnormal hypoattenuation in the right pectoralis musculature (4/56) nonspecific given recent postoperative state but an intramuscular hematoma, abscess or breast mass is not excluded. Review of the MIP images confirms the above findings. IMPRESSION: 1. No evidence of pulmonary embolism. 2. Postsurgical changes from recent right mastectomy and right axillary nodal dissection with soft tissue gas and stranding adjacent to the right pectoralis musculature with a surgical soft tissue drain coursing from the right chest wall and terminating just to the left of midline. There is some abnormal  hypoattenuation in the medial right pectoralis musculature, nonspecific given recent postoperative state and proximity to the primary breast lesion, but an intramuscular hematoma, abscess or breast mass is not excluded. 3. Numerous hypoattenuating nodules throughout the liver, not clearly evident on comparison PET-CT, concerning for metastatic disease. 4. Increased bulky mediastinal, hilar and axillary adenopathy, much of which was previously FDG avid on comparison PET-CT, concerning for metastatic disease. 5. Few rounded subpleural nodule suspicious for further metastatic disease are present as well. 6. The moderate right and trace left pleural effusions. Malignant effusion is highly likely though no pleural thickening or enhancement is seen. These results were called by telephone at the time of interpretation on 01/28/2019 at 12:36 am to provider Dr Alfred Levins , who verbally acknowledged these results. Electronically Signed   By: Lovena Le M.D.   On: 01/28/2019 00:37   Mr Lumbar Spine Wo Contrast  Result Date: 01/28/2019 CLINICAL DATA:  Back pain, greater than 6 weeks conservative treatment, persistent symptoms additional history provided: 33 year old female with known history of metastatic breast cancer status post chemotherapy. Acute onset intractable left upper back pain EXAM: MRI LUMBAR SPINE WITHOUT CONTRAST TECHNIQUE: Multiplanar, multisequence MR imaging of the lumbar spine was performed. No intravenous contrast was administered. COMPARISON:  CT abdomen/pelvis 01/18/2019. FINDINGS: Multiple sequences are mildly motion degraded. Segmentation:  5 lumbar vertebrae Alignment: Straightening of the expected lumbar lordosis. No significant spondylolisthesis. Vertebrae: There is diffuse abnormal T1 hypointense marrow signal. 1.5 cm T2/STIR hyperintense lesion within the T12 vertebral body suspicious for a metastatic lesion. Multifocal sclerotic metastases within the lower thoracic and lumbar spine as well as  bony pelvis were otherwise better appreciated on recent prior CT abdomen/pelvis 01/18/2019. Vertebral body height is maintained. Conus medullaris and cauda equina: Conus extends to the L1 level. No signal abnormality within the visualized distal spinal cord. Paraspinal and other soft tissues: Multiple liver metastases, partially imaged. No other abnormality identified within included portions of the abdomen/retroperitoneum. Paraspinal soft tissues within normal limits. Disc levels: Intervertebral disc height is preserved. There is multilevel mild to moderate facet hypertrophy. Facet hypertrophy results in mild left L4-L5 subarticular narrowing without nerve root impingement. No significant disc herniation, central canal stenosis or neural foraminal narrowing at any level. IMPRESSION: 1.5 cm T12 vertebral body lesion likely reflecting and osseous metastasis. Numerous additional sclerotic metastases within the lower thoracic and lumbar spine as well as bony pelvis were better appreciated on CT abdomen/pelvis 01/18/2019. No compression fracture. Mild lumbar spondylosis predominantly consisting of facet arthropathy. Mild L4-L5 left subarticular narrowing without nerve root impingement. No significant central canal stenosis or neural foraminal narrowing at any level. Diffuse abnormal T1 hypointense marrow signal which is nonspecific, but likely reflects red marrow reconversion given provided history. Multiple  partially imaged liver metastases. Electronically Signed   By: Kellie Simmering DO   On: 01/28/2019 14:40    Pending Labs Unresulted Labs (From admission, onward)    Start     Ordered   01/28/19 0503  Haptoglobin  Once,   STAT     01/28/19 0503   Unscheduled  Occult blood card to lab, stool  As needed,   STAT     01/28/19 0503          Vitals/Pain Today's Vitals   01/28/19 0803 01/28/19 0810 01/28/19 1230 01/28/19 1336  BP:  (!) 138/91    Pulse:  86    Resp:  16    Temp:  98.5 F (36.9 C)     TempSrc:  Oral    SpO2:  97%    Weight:      Height:      PainSc: 7   5  5      Isolation Precautions No active isolations  Medications Medications  oxyCODONE (Oxy IR/ROXICODONE) immediate release tablet 10 mg (10 mg Oral Given 01/28/19 1039)  LORazepam (ATIVAN) tablet 0.5 mg (has no administration in time range)  OLANZapine (ZYPREXA) tablet 10 mg (10 mg Oral Refused 01/28/19 0225)  prochlorperazine (COMPAZINE) tablet 10 mg (has no administration in time range)  dexamethasone (DECADRON) tablet 4 mg (4 mg Oral Given 01/28/19 1006)  ondansetron (ZOFRAN) tablet 8 mg (has no administration in time range)  gabapentin (NEURONTIN) capsule 300 mg (300 mg Oral Given 01/28/19 1620)  lidocaine-prilocaine (EMLA) cream 1 application (has no administration in time range)  enoxaparin (LOVENOX) injection 40 mg (40 mg Subcutaneous Refused 01/28/19 0219)  acetaminophen (TYLENOL) tablet 650 mg (650 mg Oral Given 01/28/19 1307)    Or  acetaminophen (TYLENOL) suppository 650 mg ( Rectal See Alternative 01/28/19 1307)  traZODone (DESYREL) tablet 25 mg (has no administration in time range)  magnesium hydroxide (MILK OF MAGNESIA) suspension 30 mL (has no administration in time range)  ketorolac (TORADOL) 30 MG/ML injection 30 mg (30 mg Intravenous Given 01/28/19 1620)  morphine 2 MG/ML injection 2 mg (2 mg Intravenous Given 01/28/19 1333)  ondansetron (ZOFRAN-ODT) disintegrating tablet 4 mg (has no administration in time range)    Or  ondansetron (ZOFRAN) injection 4 mg (has no administration in time range)  potassium chloride (KLOR-CON) packet 40 mEq (40 mEq Oral Not Given 01/28/19 0629)  polyethylene glycol (MIRALAX / GLYCOLAX) packet 17 g (17 g Oral Given 01/28/19 1625)  docusate sodium (COLACE) capsule 100 mg (100 mg Oral Not Given 01/28/19 1608)  ondansetron (ZOFRAN-ODT) disintegrating tablet 4 mg (4 mg Oral Given 01/27/19 2156)  morphine 4 MG/ML injection 4 mg (4 mg Intravenous Given 01/27/19 2302)   iohexol (OMNIPAQUE) 350 MG/ML injection 75 mL (75 mLs Intravenous Contrast Given 01/28/19 0004)  morphine 4 MG/ML injection 4 mg (4 mg Intravenous Given 01/28/19 0130)  potassium chloride (KLOR-CON) packet 40 mEq (40 mEq Oral Given 01/28/19 0350)    Mobility walks Low fall risk   Focused Assessments 1   R Recommendations: See Admitting Provider Note  Report given to:   Additional Notes:

## 2019-01-28 NOTE — Telephone Encounter (Signed)
Called patient and left her a voicemail letting her know about her upcoming appointments with Dr. Janese Banks on 02/17/2019 at 8:30 AM. I will also mail her the appointments since she is in the hospital today.

## 2019-01-28 NOTE — ED Notes (Signed)
Patient c/o she cannot go to MRI at this time due to back pain. Robin RN made aware.

## 2019-01-28 NOTE — ED Notes (Signed)
Patient c/o back pain. See [facility]

## 2019-01-28 NOTE — Progress Notes (Signed)
[facility] [place] NAME: [name]    MR#:  [id number]  DATE OF BIRTH:  06-30-1985  SUBJECTIVE:  patient came in after she started he is hurting her upper back around [place] shoulder blades. Pain started yesterday evening. She has five kids and has been doing significant amount of exertional work at home. Denies any fever cough.  She is covered negative. Her pain is now more in the tailbone area. She is a bit restless wants to go to her room. Denies shortness of breath. She is weak and has issues with constipation REVIEW OF SYSTEMS:   Review of Systems  Constitutional: Positive for malaise/fatigue. Negative for chills, fever and weight loss.  HENT: Negative for ear discharge, ear pain and nosebleeds.   Eyes: Negative for blurred vision, pain and discharge.  Respiratory: Negative for sputum production, shortness of breath, wheezing and stridor.   Cardiovascular: Negative for chest pain, palpitations, orthopnea and PND.  Gastrointestinal: Positive for constipation. Negative for abdominal pain, diarrhea, nausea and vomiting.  Genitourinary: Negative for frequency and urgency.  Musculoskeletal: Positive for back pain and joint pain.  Neurological: Positive for weakness. Negative for sensory change, speech change and focal weakness.  Psychiatric/Behavioral: Negative for depression and hallucinations. The patient is not nervous/anxious.    Tolerating Diet:yes Tolerating PT: ambulatory at baseline  DRUG ALLERGIES:  No Known Allergies  VITALS:  Blood pressure (!) 138/91, pulse 86, temperature 98.5 F (36.9 C), temperature source Oral, resp. rate 16, height 5\' 5"  (1.651 m), weight 54.6 kg, last menstrual period 11/27/2018, SpO2 97 %.  PHYSICAL EXAMINATION:   Physical Exam  GENERAL:  33 y.o.-year-old patient lying in the bed with no acute distress.  EYES: Pupils equal, round, reactive to light and accommodation. No scleral icterus.  Extraocular muscles intact.  HEENT: Head atraumatic, normocephalic. Oropharynx and nasopharynx clear. Acquired alopecia NECK:  Supple, no jugular venous distention. No thyroid enlargement, no tenderness.  LUNGS: Normal breath sounds bilaterally, no wheezing, rales, rhonchi. No use of accessory muscles of respiration. Surgical dressing on the right breast area. Surgical drain present  CARDIOVASCULAR: S1, S2 normal. No murmurs, rubs, or gallops.  ABDOMEN: Soft, nontender, nondistended. Bowel sounds present. No organomegaly or mass.  EXTREMITIES: No cyanosis, clubbing or edema b/l.    NEUROLOGIC: Cranial nerves II through XII are intact. No focal Motor or sensory deficits b/l.   PSYCHIATRIC:  patient is alert and oriented x 3.  SKIN: No obvious rash, lesion, or ulcer.   LABORATORY PANEL:  CBC Recent Labs  Lab 01/28/19 0513  WBC 14.9*  HGB 8.2*  HCT 24.5*  PLT 465*    Chemistries  Recent Labs  Lab 01/27/19 2303 01/28/19 0513  NA 138 135  K 3.4* 4.0  CL 98 97*  CO2 26 25  GLUCOSE 107* 102*  BUN 8 6  CREATININE 0.64 0.53  CALCIUM 8.9 8.7*  MG  --  2.1  AST 46*  --   ALT 33  --   ALKPHOS 161*  --   BILITOT 0.3  --    Cardiac Enzymes No results for input(s): TROPONINI in the last 168 hours. RADIOLOGY:  Dg Chest 2 View  Result Date: 01/27/2019 CLINICAL DATA:  Right-sided chest pain status post mastectomy EXAM: CHEST - 2 VIEW COMPARISON:  11/05/2018 FINDINGS: Left-sided central venous catheter with tip over the proximal right atrium. Drainage catheter over the right inframammary region. Small right greater than left pleural effusion. Mild airspace disease  at the right base. Bilateral hilar fullness, possible nodes. Perihilar interstitial opacity. No pneumothorax. IMPRESSION: 1. Small bilateral pleural effusions, right greater than left with atelectasis or mild infiltrate at the right base. 2. Increased perihilar interstitial opacity which may reflect atypical infection, less  likely edema, or possible interstitial spread of disease. 3. Bilateral hilar fullness, cannot exclude hilar nodes. Electronically Signed   By: Donavan Foil M.D.   On: 01/27/2019 22:15   Ct Angio Chest Pe W And/or Wo Contrast  Result Date: 01/28/2019 CLINICAL DATA:  Right upper back and rib pain, increasing drainage from mastectomy site, procedure performed 01/23/2019 EXAM: CT ANGIOGRAPHY CHEST WITH CONTRAST TECHNIQUE: Multidetector CT imaging of the chest was performed using the standard protocol during bolus administration of intravenous contrast. Multiplanar CT image reconstructions and MIPs were obtained to evaluate the vascular anatomy. CONTRAST:  49mL OMNIPAQUE IOHEXOL 350 MG/ML SOLN COMPARISON:  PET CT November 04, 2018 FINDINGS: Cardiovascular: Satisfactory opacification the pulmonary arteries to the segmental level. No pulmonary artery filling defects are identified. Central pulmonary arteries are normal caliber. No elevation of the RV/LV ratio (0.7). Left subclavian approach Port-A-Cath tip terminates at the cavoatrial junction. Normal cardiac size. Trace pericardial fluid. Thoracic aorta is normal caliber. Normal 3 vessel branching of the aortic arch. No luminal irregularity is seen. Mediastinum/Nodes: There is increasing bulky mediastinal, hilar and axillary adenopathy, much of which was previously FDG avid on comparison PET-CT. Reference lymph nodes include a right axillary lymph node measuring 18 mm, previously 14 mm (4/34). A 13 mm left axillary lymph node not clearly evident on comparison study (4/29). A 15 mm right hilar lymph node, measuring approximately 12 mm previously (4/44). And a 12 mm AP window lymph node, not clearly present on comparison (4/37). No acute abnormality of the trachea or esophagus. Lungs/Pleura: There is a moderate right pleural effusion and trace left effusion. No abnormal pleural thickening or enhancement adjacent areas of passive atelectasis are noted. Some interlobular  septal thickening is present as well. There are several scattered subpleural nodules, largest measuring up to 6 mm in size (6/60). Upper Abdomen: Numerous hypoattenuating nodules throughout the liver, not clearly evident on comparison PET-CT. Musculoskeletal: There are postsurgical changes from recent right mastectomy and right axillary nodal dissection with surgical clips. There is soft tissue gas and stranding adjacent the right pectoralis musculature with a a surgical soft tissue drain coursing from the right chest wall and terminating just to the left of midline. Axillary fluid in stranding is noted as well (4/49). There is some abnormal hypoattenuation in the right pectoralis musculature (4/56) nonspecific given recent postoperative state but an intramuscular hematoma, abscess or breast mass is not excluded. Review of the MIP images confirms the above findings. IMPRESSION: 1. No evidence of pulmonary embolism. 2. Postsurgical changes from recent right mastectomy and right axillary nodal dissection with soft tissue gas and stranding adjacent to the right pectoralis musculature with a surgical soft tissue drain coursing from the right chest wall and terminating just to the left of midline. There is some abnormal hypoattenuation in the medial right pectoralis musculature, nonspecific given recent postoperative state and proximity to the primary breast lesion, but an intramuscular hematoma, abscess or breast mass is not excluded. 3. Numerous hypoattenuating nodules throughout the liver, not clearly evident on comparison PET-CT, concerning for metastatic disease. 4. Increased bulky mediastinal, hilar and axillary adenopathy, much of which was previously FDG avid on comparison PET-CT, concerning for metastatic disease. 5. Few rounded subpleural nodule suspicious for further metastatic  disease are present as well. 6. The moderate right and trace left pleural effusions. Malignant effusion is highly likely though no  pleural thickening or enhancement is seen. These results were called by telephone at the time of interpretation on 01/28/2019 at 12:36 am to provider Dr Alfred Levins , who verbally acknowledged these results. Electronically Signed   By: Lovena Le M.D.   On: 01/28/2019 00:37   ASSESSMENT AND PLAN:  Coraleigh Toyama  is a 33 y.o. African American female with a known history of metastatic breast cancer, status post chemotherapy and recent right total mastectomy, who presented to the emergency room with acute onset of intractable left upper back pain.  With recent right mastectomy on 11/19 by Dr Dahlia Byes  1. Intractable upper and lower back pain with history of metastatic breast cancer -MRI of the lumbar spine -PRN and oxycodone 10 mg as needed -patient will be seen by oncology Dr. Janese Banks -palliative care consultation with Josh borders -CT chest negative for PE  2. Metastatic breast cancer -patient underwent mastectomy 1119 2020 by Dr. Adora Fridge. She still has a JP drain. No fever. Surgical dressing present. Site looks okay -seen by Dr. Celine Ahr. Incision is well healing. -Patient will follow-up with Dr. Adora Fridge as outpatient -patient is interested in restarting chemotherapy. She will follow-up with Dr. Janese Banks outpatient  3. Constipation likely due to narcotic -daily MiraLAX and Colace. PRN Dulcolax  4. Anemia likely due to chemo  5. Pleural effusion bilateral. Patient is asymptomatic with sats stable no shortness of breath. I will hold off on ultrasound-guided thoracentesis. If patient experiences shortness of breath will consider removal of fluid.   Family communication : updated patient in the ER Consults : surgery and oncology Discharge Disposition : home CODE STATUS: full DVT Prophylaxis : Lovenox  TOTAL TIME TAKING CARE OF THIS PATIENT: *30* minutes.  >50% time spent on counselling and coordination of care  POSSIBLE D/C IN *1-2* DAYS, DEPENDING ON CLINICAL CONDITION.  Note: This dictation was  prepared with Dragon dictation along with smaller phrase technology. Any transcriptional errors that result from this process are unintentional.  Fritzi Mandes M.D on 01/28/2019 at 2:26 PM  Between 7am to 6pm - Pager - [phone]  After 6pm go to [email or web address]  [facility]   CC: Primary care physician; Bunnie Pion, FNPPatient ID: [name], female   DOB: [birth year], 32 y.o.   MRN: [id number]

## 2019-01-29 ENCOUNTER — Telehealth: Payer: Self-pay | Admitting: *Deleted

## 2019-01-29 ENCOUNTER — Other Ambulatory Visit: Payer: Self-pay | Admitting: *Deleted

## 2019-01-29 ENCOUNTER — Other Ambulatory Visit: Payer: Self-pay | Admitting: Nurse Practitioner

## 2019-01-29 ENCOUNTER — Other Ambulatory Visit: Payer: Self-pay | Admitting: Oncology

## 2019-01-29 DIAGNOSIS — K5900 Constipation, unspecified: Secondary | ICD-10-CM

## 2019-01-29 LAB — SURGICAL PATHOLOGY

## 2019-01-29 LAB — HAPTOGLOBIN: Haptoglobin: 599 mg/dL — ABNORMAL HIGH (ref 33–278)

## 2019-01-29 MED ORDER — OXYCODONE HCL ER 10 MG PO T12A: 10.0000 mg | EXTENDED_RELEASE_TABLET | Freq: Two times a day (BID) | ORAL | 0 refills | Status: AC

## 2019-01-29 MED ORDER — SENNOSIDES-DOCUSATE SODIUM 8.6-50 MG PO TABS
1.0000 | ORAL_TABLET | Freq: Two times a day (BID) | ORAL | Status: DC
  Administered 2019-01-29: 1 via ORAL
  Filled 2019-01-29: qty 1

## 2019-01-29 MED ORDER — FLEET ENEMA 7-19 GM/118ML RE ENEM
1.0000 | ENEMA | Freq: Once | RECTAL | Status: AC
  Administered 2019-01-29: 1 via RECTAL

## 2019-01-29 MED ORDER — OXYCODONE HCL ER 10 MG PO T12A: 10.0000 mg | EXTENDED_RELEASE_TABLET | Freq: Two times a day (BID) | ORAL | 0 refills | Status: DC

## 2019-01-29 MED ORDER — OXYCODONE HCL 10 MG PO TABS: 10.0000 mg | ORAL_TABLET | ORAL | 0 refills | Status: DC | PRN

## 2019-01-29 MED ORDER — DOCUSATE SODIUM 100 MG PO CAPS: 100.0000 mg | ORAL_CAPSULE | Freq: Two times a day (BID) | ORAL | 0 refills | Status: AC

## 2019-01-29 MED ORDER — POLYETHYLENE GLYCOL 3350 17 G PO PACK: 17.0000 g | PACK | Freq: Every day | ORAL | 0 refills | Status: AC

## 2019-01-29 MED ORDER — OXYCODONE HCL 10 MG PO TABS: 10.0000 mg | ORAL_TABLET | ORAL | 0 refills | Status: AC | PRN

## 2019-01-29 MED ORDER — BISACODYL 10 MG RE SUPP: 10.0000 mg | Freq: Once | RECTAL | Status: DC

## 2019-01-29 NOTE — Plan of Care (Signed)
  Problem: Education: Goal: Knowledge of General Education information will improve Description: Including pain rating scale, medication(s)/side effects and non-pharmacologic comfort measures Outcome: Progressing   Problem: Health Behavior/Discharge Planning: Goal: Ability to manage health-related needs will improve Outcome: Progressing   Problem: Clinical Measurements: Goal: Ability to maintain clinical measurements within normal limits will improve Outcome: Progressing Goal: Will remain free from infection Outcome: Progressing Goal: Diagnostic test results will improve Outcome: Progressing Goal: Respiratory complications will improve Outcome: Progressing Goal: Cardiovascular complication will be avoided Outcome: Progressing   Problem: Elimination: Goal: Will not experience complications related to bowel motility Outcome: Progressing Goal: Will not experience complications related to urinary retention Outcome: Progressing   Problem: Pain Managment: Goal: General experience of comfort will improve Outcome: Progressing   Problem: Skin Integrity: Goal: Risk for impaired skin integrity will decrease Outcome: Progressing   Problem: Education: Goal: Required Educational Video(s) Outcome: Progressing

## 2019-01-29 NOTE — Telephone Encounter (Signed)
E scribe did not go through and pharmacist asked that we resend it to them

## 2019-01-29 NOTE — Progress Notes (Signed)
DISCHARGE INSTRUCTIONS TO PT. MEDS DIET ACTIVITY AND F/U DISCUSSED  VERBALIZED UNDERSTANDING. PT WILL GO BY  DR RAOS  OFFICE ON THE WAY OUT TO GET  [facility] FOR PAIN.  SL D/CD.  OUT VIA W/C

## 2019-01-29 NOTE — Telephone Encounter (Signed)
Called pt to let her know about her appt 12/14 8:30. She is still in hospital and waiting on her ride to come and get her. She saw the appt on my chart and she will be there for the appts. I called pharmacy to make sure [facility] did not need PA and it was covered but they have to order it and it will not be in until Friday per pharmacist

## 2019-01-29 NOTE — Discharge Instructions (Signed)
Patient advised to keep appointment at the cancer center, Dr Marla Roe (plastics) and Dr Dahlia Byes

## 2019-01-29 NOTE — Discharge Summary (Signed)
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 Anemia   . Breast cancer [facility])     HOSPITAL COURSE:  [name] a32 y.o.African Midwife a known history of metastatic breast cancer, status postchemotherapy andrecent right total mastectomy, who presented to the emergency room with acute onset of intractableleftupper back pain.With recent right mastectomy on 11/19 by Dr Dahlia Byes  1. Intractable upper and lower back pain with history of metastatic breast cancer -MRI of the lumbar spine--showed Metastatic lesions at T12 and L1 -Oxycontin  and oxycodone 10 mg as needed prescriptions have been called in by Dr Janese Banks including Sheralyn Boatman -patient  seen by oncology Dr. Janese Banks-- recommends patient to keep up oncology appointment for palliative chemo -palliative care consultation with Josh borders appreciated -CT chest negative for PE -overall feels better than yesterday. She is recommended not to do heavy lifting  2. Metastatic breast cancer -patient underwent mastectomy 11/19/ 2020 by Dr. Perrin Maltese. She still has a JP drain. No fever. Surgical dressing present. Site looks okay -seen by Dr. Celine Ahr. Incision is well healing. -Patient will follow-up with Dr. Perrin Maltese as outpatient for post op eval and drain removal  3. Constipation likely due to narcotic -daily MiraLAX and Colace. PRN  Dulcolax -Movantik rx sent  4. 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 Pleural effusion bilateral. Patient is asymptomatic with sats stable no shortness of breath. I will hold off on ultrasound-guided thoracentesis.  Discussed at length with patient regarding follow-up at the cancer center for palliative chemo. She did voice understanding. She will discharge to home. pt is agreeable with plan  Family communication : updated patient  Consults : surgery and oncology Discharge Disposition : home CODE STATUS: full DVT Prophylaxis : Lovenox   CONSULTS OBTAINED:  Treatment Team:  Sindy Guadeloupe, MD  DRUG ALLERGIES:  No Known Allergies  DISCHARGE MEDICATIONS:   Allergies as of 01/29/2019   No Known Allergies     Medication List    STOP taking these medications   LORazepam 0.5 MG tablet Commonly known as: Ativan     TAKE these medications   dexamethasone 4 MG tablet Commonly known as: DECADRON Take 2 tablets by mouth once a day on the day after chemotherapy and then take 2 tablets two times a day for 2 days. Take with food.   docusate sodium 100 MG capsule Commonly known as: COLACE Take 1 capsule (100 mg total) by mouth 2 (two) times daily.   gabapentin 300 MG capsule Commonly known as: Neurontin Take 1 capsule (300 mg total) by mouth 3 (three) times daily.   lidocaine-prilocaine cream Commonly known as: EMLA Apply 1 application topically as directed. Apply small amount over port site 1 hour before treatments, cover the cream with saran wrap to protect your clothing   naloxegol oxalate 25 MG Tabs tablet Commonly known as: Movantik Take 1 tablet (25 mg total) by mouth daily.   OLANZapine 10 MG tablet Commonly  known as: ZYPREXA Take 1 tablet (10 mg total) by mouth at bedtime. Take a tablet day 2 through day 5 after each chemo treatment   ondansetron 8 MG tablet Commonly known as: Zofran Take 1 tablet (8 mg total) by mouth 2 (two) times daily as needed. Start  on the third day after chemotherapy.   Oxycodone HCl 10 MG Tabs Take 1 tablet (10 mg total) by mouth every 4 (four) hours as needed for up to 15 days.   polyethylene glycol 17 g packet Commonly known as: MIRALAX / GLYCOLAX Take 17 g by mouth daily.   prochlorperazine 10 MG tablet Commonly known as: COMPAZINE Take 1 tablet (10 mg total) by mouth every 6 (six) hours as needed (Nausea or vomiting).       If you experience worsening of your admission symptoms, develop shortness of breath, life threatening emergency, suicidal or homicidal thoughts you must seek medical attention immediately by calling 911 or calling your MD immediately  if symptoms less severe.  You Must read complete instructions/literature along with all the possible adverse reactions/side effects for all the Medicines you take and that have been prescribed to you. Take any new Medicines after you have completely understood and accept all the possible adverse reactions/side effects.   Please note  You were cared for by a hospitalist during your hospital stay. If you have any questions about your discharge medications or the care you received while you were in the hospital after you are discharged, you can call the unit and asked to speak with the hospitalist on call if the hospitalist that took care of you is not available. Once you are discharged, your primary care physician will handle any further medical issues. Please note that NO REFILLS for any discharge medications will be authorized once you are discharged, as it is imperative that you return to your primary care physician (or establish a relationship with a primary care physician if you do not have one) for your aftercare needs so that they can reassess your need for medications and monitor your lab values. Today   SUBJECTIVE   Overall feels better. Last BM was 2 days ago  VITAL SIGNS:  Blood pressure (!) 164/97, pulse 95, temperature 98.9 F (37.2 C),  temperature source Oral, resp. rate 15, height 5\' 5"  (1.651 m), weight 48.5 kg, last menstrual period 11/27/2018, SpO2 95 %.  I/O:    Intake/Output Summary (Last 24 hours) at 01/29/2019 0833 Last data filed at 01/29/2019 0502 Gross per 24 hour  Intake 607.62 ml  Output 340 ml  Net 267.62 ml    PHYSICAL EXAMINATION:  GENERAL:  33 y.o.-year-old patient lying in the bed with no acute distress.  EYES: Pupils equal, round, reactive to light and accommodation. No scleral icterus. Extraocular muscles intact.  HEENT: Head atraumatic, normocephalic. Oropharynx and nasopharynx clear. Acquired alopecia NECK:  Supple, no jugular venous distention. No thyroid enlargement, no tenderness.  LUNGS: Normal breath sounds bilaterally, no wheezing, rales,rhonchi or crepitation. No use of accessory muscles of respiration. Surgical dressing with drain + on chest CARDIOVASCULAR: S1, S2 normal. No murmurs, rubs, or gallops.  ABDOMEN: Soft, non-tender, non-distended. Bowel sounds present. No organomegaly or mass.  EXTREMITIES: No pedal edema, cyanosis, or clubbing.  NEUROLOGIC: Cranial nerves II through XII are intact. Muscle strength 4+/5 in all extremities. Sensation intact. Gait not checked.  PSYCHIATRIC: The patient is alert and oriented x 3.  SKIN: No obvious rash, lesion, or ulcer.   DATA REVIEW:  CBC  Recent Labs  Lab 01/28/19 0513  WBC 14.9*  HGB 8.2*  HCT 24.5*  PLT 465*    Chemistries  Recent Labs  Lab 01/27/19 2303 01/28/19 0513  NA 138 135  K 3.4* 4.0  CL 98 97*  CO2 26 25  GLUCOSE 107* 102*  BUN 8 6  CREATININE 0.64 0.53  CALCIUM 8.9 8.7*  MG  --  2.1  AST 46*  --   ALT 33  --   ALKPHOS 161*  --   BILITOT 0.3  --     Microbiology Results   Recent Results (from the past 240 hour(s))  SARS CORONAVIRUS 2 (TAT 6-24 HRS) Nasopharyngeal Nasopharyngeal Swab     Status: None   Collection Time: 01/20/19  9:37 AM   Specimen: Nasopharyngeal Swab  Result Value Ref Range Status    SARS Coronavirus 2 NEGATIVE NEGATIVE Final    Comment: (NOTE) SARS-CoV-2 target nucleic acids are NOT DETECTED. The SARS-CoV-2 RNA is generally detectable in upper and lower respiratory specimens during the acute phase of infection. Negative results do not preclude SARS-CoV-2 infection, do not rule out co-infections with other pathogens, and should not be used as the sole basis for treatment or other patient management decisions. Negative results must be combined with clinical observations, patient history, and epidemiological information. The expected result is Negative. Fact Sheet for Patients: SugarRoll.be Fact Sheet for Healthcare Providers: https://www.woods-mathews.com/ This test is not yet approved or cleared by the [place] FDA and  has been authorized for detection and/or diagnosis of SARS-CoV-2 by FDA under an Emergency Use Authorization (EUA). This EUA will remain  in effect (meaning this test can be used) for the duration of the COVID-19 declaration under Section 56 4(b)(1) of the Act, 21 U.S.C. section 360bbb-3(b)(1), unless the authorization is terminated or revoked sooner. Performed at [facility], [place] [address], [place], [place] [address]   SARS CORONAVIRUS 2 (TAT 6-24 HRS) Nasopharyngeal Nasopharyngeal Swab     Status: None   Collection Time: 01/28/19  1:34 AM   Specimen: Nasopharyngeal Swab  Result Value Ref Range Status   SARS Coronavirus 2 NEGATIVE NEGATIVE Final    Comment: (NOTE) SARS-CoV-2 target nucleic acids are NOT DETECTED. The SARS-CoV-2 RNA is generally detectable in upper and lower respiratory specimens during the acute phase of infection. Negative results do not preclude SARS-CoV-2 infection, do not rule out co-infections with other pathogens, and should not be used as the sole basis for treatment or other patient management decisions. Negative results must be combined with clinical  observations, patient history, and epidemiological information. The expected result is Negative. Fact Sheet for Patients: SugarRoll.be Fact Sheet for Healthcare Providers: https://www.woods-mathews.com/ This test is not yet approved or cleared by the [place] FDA and  has been authorized for detection and/or diagnosis of SARS-CoV-2 by FDA under an Emergency Use Authorization (EUA). This EUA will remain  in effect (meaning this test can be used) for the duration of the COVID-19 declaration under Section 56 4(b)(1) of the Act, 21 U.S.C. section 360bbb-3(b)(1), unless the authorization is terminated or revoked sooner. Performed at [facility], [place] [address], [place], [place] [address]     RADIOLOGY:  Dg Chest 2 View  Result Date: 01/27/2019 CLINICAL DATA:  Right-sided chest pain status post mastectomy EXAM: CHEST - 2 VIEW COMPARISON:  11/05/2018 FINDINGS: Left-sided central venous catheter with tip over the proximal right atrium. Drainage catheter over the right inframammary region. Small right greater than  left pleural effusion. Mild airspace disease at the right base. Bilateral hilar fullness, possible nodes. Perihilar interstitial opacity. No pneumothorax. IMPRESSION: 1. Small bilateral pleural effusions, right greater than left with atelectasis or mild infiltrate at the right base. 2. Increased perihilar interstitial opacity which may reflect atypical infection, less likely edema, or possible interstitial spread of disease. 3. Bilateral hilar fullness, cannot exclude hilar nodes. Electronically Signed   By: Donavan Foil M.D.   On: 01/27/2019 22:15   Ct Angio Chest Pe W And/or Wo Contrast  Result Date: 01/28/2019 CLINICAL DATA:  Right upper back and rib pain, increasing drainage from mastectomy site, procedure performed 01/23/2019 EXAM: CT ANGIOGRAPHY CHEST WITH CONTRAST TECHNIQUE: Multidetector CT imaging of the chest was performed  using the standard protocol during bolus administration of intravenous contrast. Multiplanar CT image reconstructions and MIPs were obtained to evaluate the vascular anatomy. CONTRAST:  42mL OMNIPAQUE IOHEXOL 350 MG/ML SOLN COMPARISON:  PET CT November 04, 2018 FINDINGS: Cardiovascular: Satisfactory opacification the pulmonary arteries to the segmental level. No pulmonary artery filling defects are identified. Central pulmonary arteries are normal caliber. No elevation of the RV/LV ratio (0.7). Left subclavian approach Port-A-Cath tip terminates at the cavoatrial junction. Normal cardiac size. Trace pericardial fluid. Thoracic aorta is normal caliber. Normal 3 vessel branching of the aortic arch. No luminal irregularity is seen. Mediastinum/Nodes: There is increasing bulky mediastinal, hilar and axillary adenopathy, much of which was previously FDG avid on comparison PET-CT. Reference lymph nodes include a right axillary lymph node measuring 18 mm, previously 14 mm (4/34). A 13 mm left axillary lymph node not clearly evident on comparison study (4/29). A 15 mm right hilar lymph node, measuring approximately 12 mm previously (4/44). And a 12 mm AP window lymph node, not clearly present on comparison (4/37). No acute abnormality of the trachea or esophagus. Lungs/Pleura: There is a moderate right pleural effusion and trace left effusion. No abnormal pleural thickening or enhancement adjacent areas of passive atelectasis are noted. Some interlobular septal thickening is present as well. There are several scattered subpleural nodules, largest measuring up to 6 mm in size (6/60). Upper Abdomen: Numerous hypoattenuating nodules throughout the liver, not clearly evident on comparison PET-CT. Musculoskeletal: There are postsurgical changes from recent right mastectomy and right axillary nodal dissection with surgical clips. There is soft tissue gas and stranding adjacent the right pectoralis musculature with a a surgical soft  tissue drain coursing from the right chest wall and terminating just to the left of midline. Axillary fluid in stranding is noted as well (4/49). There is some abnormal hypoattenuation in the right pectoralis musculature (4/56) nonspecific given recent postoperative state but an intramuscular hematoma, abscess or breast mass is not excluded. Review of the MIP images confirms the above findings. IMPRESSION: 1. No evidence of pulmonary embolism. 2. Postsurgical changes from recent right mastectomy and right axillary nodal dissection with soft tissue gas and stranding adjacent to the right pectoralis musculature with a surgical soft tissue drain coursing from the right chest wall and terminating just to the left of midline. There is some abnormal hypoattenuation in the medial right pectoralis musculature, nonspecific given recent postoperative state and proximity to the primary breast lesion, but an intramuscular hematoma, abscess or breast mass is not excluded. 3. Numerous hypoattenuating nodules throughout the liver, not clearly evident on comparison PET-CT, concerning for metastatic disease. 4. Increased bulky mediastinal, hilar and axillary adenopathy, much of which was previously FDG avid on comparison PET-CT, concerning for metastatic disease. 5. Few rounded  subpleural nodule suspicious for further metastatic disease are present as well. 6. The moderate right and trace left pleural effusions. Malignant effusion is highly likely though no pleural thickening or enhancement is seen. These results were called by telephone at the time of interpretation on 01/28/2019 at 12:36 am to provider Dr Alfred Levins , who verbally acknowledged these results. Electronically Signed   By: Lovena Le M.D.   On: 01/28/2019 00:37   Mr Lumbar Spine Wo Contrast  Result Date: 01/28/2019 CLINICAL DATA:  Back pain, greater than 6 weeks conservative treatment, persistent symptoms additional history provided: 33 year old female with known  history of metastatic breast cancer status post chemotherapy. Acute onset intractable left upper back pain EXAM: MRI LUMBAR SPINE WITHOUT CONTRAST TECHNIQUE: Multiplanar, multisequence MR imaging of the lumbar spine was performed. No intravenous contrast was administered. COMPARISON:  CT abdomen/pelvis 01/18/2019. FINDINGS: Multiple sequences are mildly motion degraded. Segmentation:  5 lumbar vertebrae Alignment: Straightening of the expected lumbar lordosis. No significant spondylolisthesis. Vertebrae: There is diffuse abnormal T1 hypointense marrow signal. 1.5 cm T2/STIR hyperintense lesion within the T12 vertebral body suspicious for a metastatic lesion. Multifocal sclerotic metastases within the lower thoracic and lumbar spine as well as bony pelvis were otherwise better appreciated on recent prior CT abdomen/pelvis 01/18/2019. Vertebral body height is maintained. Conus medullaris and cauda equina: Conus extends to the L1 level. No signal abnormality within the visualized distal spinal cord. Paraspinal and other soft tissues: Multiple liver metastases, partially imaged. No other abnormality identified within included portions of the abdomen/retroperitoneum. Paraspinal soft tissues within normal limits. Disc levels: Intervertebral disc height is preserved. There is multilevel mild to moderate facet hypertrophy. Facet hypertrophy results in mild left L4-L5 subarticular narrowing without nerve root impingement. No significant disc herniation, central canal stenosis or neural foraminal narrowing at any level. IMPRESSION: 1.5 cm T12 vertebral body lesion likely reflecting and osseous metastasis. Numerous additional sclerotic metastases within the lower thoracic and lumbar spine as well as bony pelvis were better appreciated on CT abdomen/pelvis 01/18/2019. No compression fracture. Mild lumbar spondylosis predominantly consisting of facet arthropathy. Mild L4-L5 left subarticular narrowing without nerve root  impingement. No significant central canal stenosis or neural foraminal narrowing at any level. Diffuse abnormal T1 hypointense marrow signal which is nonspecific, but likely reflects red marrow reconversion given provided history. Multiple partially imaged liver metastases. Electronically Signed   By: Kellie Simmering DO   On: 01/28/2019 14:40     CODE STATUS:     Code Status Orders  (From admission, onward)         Start     Ordered   01/28/19 0153  Full code  Continuous     01/28/19 0157        Code Status History    Date Active Date Inactive Code Status Order ID Comments User Context   01/23/2019 0944 01/24/2019 1936 Full Code [id number]  Jules Husbands, MD Inpatient   04/24/2016 2144 04/26/2016 2216 Full Code [id number]  Defrancesco, Alanda Slim, MD Inpatient   04/24/2016 1408 04/24/2016 1938 Full Code [id number]  Dalia Heading, CNM Inpatient   01/17/2016 1905 01/18/2016 0111 Full Code [id number]  Skipper Cliche, RN Inpatient   Advance Care Planning Activity     Family Discussion: Consults:   TOTAL TIME TAKING CARE OF THIS PATIENT: *40* minutes.    Fritzi Mandes M.D on 01/29/2019 at 8:33 AM  Between 7am to 6pm - Pager - [phone] After 6pm go to [email or web address] - password [email or web address]  [facility]    CC: Primary care physician; Bunnie Pion, FNP

## 2019-01-29 NOTE — Progress Notes (Signed)
Chaplain followed up with the patient from yesterday's pastoral care visit. Upon arrival, the patient was up changing out of her hospital gown into her t-shirt as she prepares to discharge home. The patient reported that she is feeling "a bit better today." Chaplain offered insights from yesterday's conversation around the patient reporting that she is feeling down, her agency, and meeting goals (medical and personal). The patient expressed understanding. She also affirmed that she is ready to go home and was awaiting the arrival of her ride. The patient received a phone call, so this chaplain left to allow the patient time to take her call.

## 2019-01-29 NOTE — Telephone Encounter (Signed)
Called pt to say that the electronic system to send narcotics is not working on several md's in clinic so therefore we can print th rx and she will need to come and get it from cancer center. She will be here in 15 min. And she will need to sign for the drug.

## 2019-02-04 ENCOUNTER — Encounter: Payer: Medicaid Other | Admitting: Plastic Surgery

## 2019-02-06 ENCOUNTER — Other Ambulatory Visit: Payer: Self-pay | Admitting: Nurse Practitioner

## 2019-02-10 ENCOUNTER — Emergency Department: Payer: Medicaid Other

## 2019-02-10 ENCOUNTER — Encounter: Payer: Self-pay | Admitting: Emergency Medicine

## 2019-02-10 ENCOUNTER — Emergency Department
Admission: EM | Admit: 2019-02-10 | Discharge: 2019-02-10 | Disposition: A | Discharge status: Home / Self Care | Payer: Medicaid Other | Attending: Emergency Medicine | Admitting: Emergency Medicine

## 2019-02-10 ENCOUNTER — Other Ambulatory Visit: Payer: Self-pay

## 2019-02-10 ENCOUNTER — Encounter: Payer: Medicaid Other | Admitting: Surgery

## 2019-02-10 DIAGNOSIS — Z79891 Long term (current) use of opiate analgesic: Secondary | ICD-10-CM | POA: Diagnosis not present

## 2019-02-10 DIAGNOSIS — M545 Low back pain, unspecified: Secondary | ICD-10-CM

## 2019-02-10 DIAGNOSIS — G8929 Other chronic pain: Secondary | ICD-10-CM | POA: Diagnosis not present

## 2019-02-10 DIAGNOSIS — F17210 Nicotine dependence, cigarettes, uncomplicated: Secondary | ICD-10-CM | POA: Insufficient documentation

## 2019-02-10 DIAGNOSIS — C50911 Malignant neoplasm of unspecified site of right female breast: Secondary | ICD-10-CM | POA: Insufficient documentation

## 2019-02-10 DIAGNOSIS — Z79899 Other long term (current) drug therapy: Secondary | ICD-10-CM | POA: Diagnosis not present

## 2019-02-10 DIAGNOSIS — U071 COVID-19: Secondary | ICD-10-CM | POA: Diagnosis not present

## 2019-02-10 DIAGNOSIS — R0602 Shortness of breath: Secondary | ICD-10-CM | POA: Diagnosis present

## 2019-02-10 DIAGNOSIS — J1289 Other viral pneumonia: Secondary | ICD-10-CM

## 2019-02-10 DIAGNOSIS — C50919 Malignant neoplasm of unspecified site of unspecified female breast: Secondary | ICD-10-CM

## 2019-02-10 LAB — COMPREHENSIVE METABOLIC PANEL
ALT: 132 U/L — ABNORMAL HIGH (ref 0–44)
AST: 260 U/L — ABNORMAL HIGH (ref 15–41)
Albumin: 2.7 g/dL — ABNORMAL LOW (ref 3.5–5.0)
Alkaline Phosphatase: 653 U/L — ABNORMAL HIGH (ref 38–126)
Anion gap: 20 — ABNORMAL HIGH (ref 5–15)
BUN: 8 mg/dL (ref 6–20)
CO2: 19 mmol/L — ABNORMAL LOW (ref 22–32)
Calcium: 9 mg/dL (ref 8.9–10.3)
Chloride: 97 mmol/L — ABNORMAL LOW (ref 98–111)
Creatinine, Ser: 0.52 mg/dL (ref 0.44–1.00)
GFR calc Af Amer: >60 mL/min (ref >60)
GFR calc non Af Amer: >60 mL/min (ref >60)
Glucose, Bld: 75 mg/dL (ref 70–99)
Potassium: 3.1 mmol/L — ABNORMAL LOW (ref 3.5–5.1)
Sodium: 136 mmol/L (ref 135–145)
Total Bilirubin: 0.8 mg/dL (ref 0.3–1.2)
Total Protein: 6.9 g/dL (ref 6.5–8.1)

## 2019-02-10 LAB — CBC
HCT: 28.8 % — ABNORMAL LOW (ref 36.0–46.0)
Hemoglobin: 9.1 g/dL — ABNORMAL LOW (ref 12.0–15.0)
MCH: 25.1 pg — ABNORMAL LOW (ref 26.0–34.0)
MCHC: 31.6 g/dL (ref 30.0–36.0)
MCV: 79.6 fL — ABNORMAL LOW (ref 80.0–100.0)
Platelets: 433 10*3/uL — ABNORMAL HIGH (ref 150–400)
RBC: 3.62 MIL/uL — ABNORMAL LOW (ref 3.87–5.11)
RDW: 20.4 % — ABNORMAL HIGH (ref 11.5–15.5)
WBC: 13.2 10*3/uL — ABNORMAL HIGH (ref 4.0–10.5)
nRBC: 12 % — ABNORMAL HIGH (ref 0.0–0.2)

## 2019-02-10 LAB — URINALYSIS, COMPLETE (UACMP) WITH MICROSCOPIC
Bacteria, UA: NONE SEEN
Bilirubin Urine: NEGATIVE
Glucose, UA: NEGATIVE mg/dL
Hgb urine dipstick: NEGATIVE
Ketones, ur: 5 mg/dL — AB
Leukocytes,Ua: NEGATIVE
Nitrite: NEGATIVE
Protein, ur: 100 mg/dL — AB
Specific Gravity, Urine: 1.025 (ref 1.005–1.030)
pH: 5 (ref 5.0–8.0)

## 2019-02-10 LAB — POCT PREGNANCY, URINE: Preg Test, Ur: NEGATIVE

## 2019-02-10 LAB — TROPONIN I (HIGH SENSITIVITY)
Troponin I (High Sensitivity): 21 ng/L — ABNORMAL HIGH (ref <18)
Troponin I (High Sensitivity): 19 ng/L — ABNORMAL HIGH (ref <18)

## 2019-02-10 LAB — BRAIN NATRIURETIC PEPTIDE: B Natriuretic Peptide: 46 pg/mL (ref 0.0–100.0)

## 2019-02-10 LAB — POC SARS CORONAVIRUS 2 AG: SARS Coronavirus 2 Ag: POSITIVE — AB

## 2019-02-10 MED ORDER — IOHEXOL 9 MG/ML PO SOLN: 500.0000 mL | ORAL | Status: DC

## 2019-02-10 MED ORDER — OXYCODONE HCL ER 10 MG PO T12A: 10.0000 mg | EXTENDED_RELEASE_TABLET | Freq: Two times a day (BID) | ORAL | 0 refills | Status: DC

## 2019-02-10 MED ORDER — MORPHINE SULFATE (PF) 4 MG/ML IV SOLN
6.0000 mg | Freq: Once | INTRAVENOUS | Status: AC
  Administered 2019-02-10: 6 mg via INTRAVENOUS
  Filled 2019-02-10: qty 2

## 2019-02-10 MED ORDER — OXYCODONE HCL ER 10 MG PO T12A
10.0000 mg | EXTENDED_RELEASE_TABLET | Freq: Once | ORAL | Status: AC
  Administered 2019-02-10: 10 mg via ORAL
  Filled 2019-02-10: qty 1

## 2019-02-10 MED ORDER — LACTATED RINGERS IV BOLUS
1000.0000 mL | Freq: Once | INTRAVENOUS | Status: AC
  Administered 2019-02-10: 1000 mL via INTRAVENOUS

## 2019-02-10 MED ORDER — OXYCODONE HCL ER 10 MG PO T12A: 10.0000 mg | EXTENDED_RELEASE_TABLET | Freq: Two times a day (BID) | ORAL | 0 refills | Status: AC

## 2019-02-10 MED ORDER — HYDROMORPHONE HCL 1 MG/ML IJ SOLN
1.0000 mg | Freq: Once | INTRAMUSCULAR | Status: AC
  Administered 2019-02-10: 1 mg via INTRAVENOUS
  Filled 2019-02-10: qty 1

## 2019-02-10 MED ORDER — IOHEXOL 350 MG/ML SOLN
75.0000 mL | Freq: Once | INTRAVENOUS | Status: AC | PRN
  Administered 2019-02-10: 75 mL via INTRAVENOUS

## 2019-02-10 NOTE — ED Triage Notes (Signed)
Pt reports back pain and SOB for the past 2 days. Pt had a right sided mastectomy on 11/19.

## 2019-02-10 NOTE — ED Notes (Signed)
AAOx3.  Skin warm and dry.  NAD 

## 2019-02-10 NOTE — ED Provider Notes (Signed)
[facility] Emergency Department Provider Note   ____________________________________________   First MD Initiated Contact with Patient 02/10/19 [phone]     (approximate)  I have reviewed the triage vital signs and the nursing notes.   HISTORY  Chief Complaint Back Pain and Shortness of Breath    HPI [name] is a 33 y.o. female with past medical history of metastatic breast cancer presents to the ED complaining of back pain or shortness of breath.  Patient reports over the past 2 days she has developed new pain in her left upper back as well as her bilateral lower back.  She has known metastatic lesions at T12 and L1 and has dealt with mid back pain in the past, but she states these locations of pain are new for her.  She describes pain as worse with a deep breath and associated with some mild shortness of breath.  She denies any associated fevers or cough.  She was prescribed pain medication at the time of her discharge following recent hospitalization, but states she ran out a few days ago.  She has been taking Tylenol for her pain without relief.  She denies any saddle anesthesia, urinary retention/incontinence, numbness or weakness in her lower extremities.  She also had recent mastectomy on November 19, states JP drain that is in place continues to drain yellowish fluid and she has not had significant pain from drain site.  She states she was supposed to have follow-up with surgery today, but received a phone call that this appointment was canceled.        Past Medical History:  Diagnosis Date  . Anemia   . Breast cancer [facility])     Patient Active Problem List   Diagnosis Date Noted  . Constipation   . Intractable back pain 01/28/2019  . Cancer associated pain   . Metastatic breast cancer ([place])   . Palliative care encounter   . Goals of care, counseling/discussion 11/08/2018  . Breast cancer ([place]) 11/07/2018  . Iron deficiency anemia  11/05/2018  . Anemia 04/28/2016  . Status post tubal ligation 04/28/2016  . Status post repeat low transverse cesarean section 04/24/2016  . Back pain 01/17/2016    Past Surgical History:  Procedure Laterality Date  . BREAST BIOPSY Right 10/25/2018   Heart Clip, Pending path  . BREAST BIOPSY Right 10/25/2018   Lymph node biopsy, Hydromarker (butterfly), path pending  . CESAREAN SECTION    . CESAREAN SECTION N/A 04/24/2016   Procedure: REPEAT CESAREAN SECTION WITH BILATERAL TUBALIGATION;  Surgeon: Brayton Mars, MD;  Location: [facility] ORS;  Service: Obstetrics;  Laterality: N/A;  Baby Boy born @ 33 Apgars:8/9 Weight: 8lb 9oz  . PORTACATH PLACEMENT Left 11/05/2018   Procedure: INSERTION PORT-A-CATH;  Surgeon: Jules Husbands, MD;  Location: [facility] ORS;  Service: General;  Laterality: Left;  . PRIMARY CLOSURE Right 01/23/2019   Procedure: CLOSURE OF RIGHT BREAST;  Surgeon: Wallace Going, DO;  Location: [facility] ORS;  Service: Plastics;  Laterality: Right;  . SIMPLE MASTECTOMY WITH AXILLARY SENTINEL NODE BIOPSY Right 01/23/2019   Procedure: SIMPLE MASTECTOMY;  Surgeon: Jules Husbands, MD;  Location: [facility] ORS;  Service: General;  Laterality: Right;  combined surgery with Dr Baltazar Apo    Prior to Admission medications   Medication Sig Start Date End Date Taking? Authorizing Provider  dexamethasone (DECADRON) 4 MG tablet Take 2 tablets by mouth once a day on the day after chemotherapy and then take 2 tablets two times  a day for 2 days. Take with food. 11/07/18   Sindy Guadeloupe, MD  docusate sodium (COLACE) 100 MG capsule Take 1 capsule (100 mg total) by mouth 2 (two) times daily. 01/29/19   Fritzi Mandes, MD  gabapentin (NEURONTIN) 300 MG capsule Take 1 capsule (300 mg total) by mouth 3 (three) times daily. 11/04/18   Pabon, [place], MD  lidocaine-prilocaine (EMLA) cream Apply 1 application topically as directed. Apply small amount over port site 1 hour before treatments, cover the cream with  saran wrap to protect your clothing 01/13/19   Sindy Guadeloupe, MD  naloxegol oxalate (MOVANTIK) 25 MG TABS tablet Take 1 tablet (25 mg total) by mouth daily. 01/28/19   Sindy Guadeloupe, MD  OLANZapine (ZYPREXA) 10 MG tablet Take 1 tablet (10 mg total) by mouth at bedtime. Take a tablet day 2 through day 5 after each chemo treatment 12/03/18   Sindy Guadeloupe, MD  ondansetron (ZOFRAN) 8 MG tablet Take 1 tablet (8 mg total) by mouth 2 (two) times daily as needed. Start on the third day after chemotherapy. 11/07/18   Sindy Guadeloupe, MD  oxyCODONE (OXYCONTIN) 10 mg 12 hr tablet Take 1 tablet (10 mg total) by mouth every 12 (twelve) hours. 01/29/19   Verlon Au, NP  oxyCODONE (OXYCONTIN) 10 mg 12 hr tablet Take 1 tablet (10 mg total) by mouth every 12 (twelve) hours for 15 days. 02/10/19 02/25/19  Blake Divine, MD  Oxycodone HCl 10 MG TABS Take 1 tablet (10 mg total) by mouth every 4 (four) hours as needed for up to 20 days. 01/29/19 02/18/19  Verlon Au, NP  polyethylene glycol (MIRALAX / GLYCOLAX) 17 g packet Take 17 g by mouth daily. 01/29/19   Fritzi Mandes, MD  prochlorperazine (COMPAZINE) 10 MG tablet Take 1 tablet (10 mg total) by mouth every 6 (six) hours as needed (Nausea or vomiting). 11/07/18   Sindy Guadeloupe, MD    Allergies Patient has no known allergies.  Family History  Problem Relation Age of Onset  . Hypertension Mother   . Stroke Father   . Cancer Maternal Grandmother   . Diabetes Maternal Grandfather   . Heart disease Maternal Grandfather   . Hypertension Maternal Grandfather     Social History Social History   Tobacco Use  . Smoking status: Current Every Day Smoker    Packs/day: 0.25    Years: 5.00    Pack years: 1.25    Types: Cigarettes  . Smokeless tobacco: Never Used  Substance Use Topics  . Alcohol use: No  . Drug use: No    Review of Systems  Constitutional: No fever/chills Eyes: No visual changes. ENT: No sore throat. Cardiovascular: Denies chest  pain. Respiratory: Positive for shortness of breath. Gastrointestinal: No abdominal pain.  No nausea, no vomiting.  No diarrhea.  No constipation. Genitourinary: Negative for dysuria. Musculoskeletal: Positive for back pain. Skin: Negative for rash. Neurological: Negative for headaches, focal weakness or numbness.  ____________________________________________   PHYSICAL EXAM:  VITAL SIGNS: ED Triage Vitals  Enc Vitals Group     BP 02/10/19 0802 135/86     Pulse Rate 02/10/19 0802 (!) 140     Resp 02/10/19 0802 20     Temp 02/10/19 0802 (!) 97.5 F (36.4 C)     Temp Source 02/10/19 0802 Oral     SpO2 02/10/19 0802 100 %     Weight 02/10/19 0803 106 lb (48.1 kg)  Height 02/10/19 0803 5\' 5"  (1.651 m)     Head Circumference --      Peak Flow --      Pain Score 02/10/19 0802 10     Pain Loc --      Pain Edu? --      Excl. in [place]? --     Constitutional: Alert and oriented. Eyes: Conjunctivae are normal. Head: Atraumatic. Nose: No congestion/rhinnorhea. Mouth/Throat: Mucous membranes are moist. Neck: Normal ROM Cardiovascular: Tachycardic, regular rhythm. Grossly normal heart sounds. Respiratory: Tachypneic with normal respiratory effort.  No retractions. Lungs CTAB.  JP drain to right lateral chest wall with serous drainage, site is clean, dry, and intact with no erythema or tenderness. Gastrointestinal: Soft and nontender. No distention. Genitourinary: deferred Musculoskeletal: No lower extremity tenderness nor edema.  Midline lumbar spinal tenderness as well as paraspinal tenderness in lumbar region bilaterally and left upper thoracic region. Neurologic:  Normal speech and language. No gross focal neurologic deficits are appreciated. Skin:  Skin is warm, dry and intact. No rash noted. Psychiatric: Mood and affect are normal. Speech and behavior are normal.  ____________________________________________   LABS (all labs ordered are listed, but only abnormal results are  displayed)  Labs Reviewed  CBC - Abnormal; Notable for the following components:      Result Value   WBC 13.2 (*)    RBC 3.62 (*)    Hemoglobin 9.1 (*)    HCT 28.8 (*)    MCV 79.6 (*)    MCH 25.1 (*)    RDW 20.4 (*)    Platelets 433 (*)    nRBC 12.0 (*)    All other components within normal limits  COMPREHENSIVE METABOLIC PANEL - Abnormal; Notable for the following components:   Potassium 3.1 (*)    Chloride 97 (*)    CO2 19 (*)    Albumin 2.7 (*)    AST 260 (*)    ALT 132 (*)    Alkaline Phosphatase 653 (*)    Anion gap 20 (*)    All other components within normal limits  URINALYSIS, COMPLETE (UACMP) WITH MICROSCOPIC - Abnormal; Notable for the following components:   Color, Urine YELLOW (*)    APPearance HAZY (*)    Ketones, ur 5 (*)    Protein, ur 100 (*)    All other components within normal limits  POC SARS CORONAVIRUS 2 AG - Abnormal; Notable for the following components:   SARS Coronavirus 2 Ag POSITIVE (*)    All other components within normal limits  TROPONIN I (HIGH SENSITIVITY) - Abnormal; Notable for the following components:   Troponin I (High Sensitivity) 21 (*)    All other components within normal limits  TROPONIN I (HIGH SENSITIVITY) - Abnormal; Notable for the following components:   Troponin I (High Sensitivity) 19 (*)    All other components within normal limits  BRAIN NATRIURETIC PEPTIDE  POC URINE PREG, ED  POC SARS CORONAVIRUS 2 AG -  ED  POCT PREGNANCY, URINE   ED ECG REPORT I, Blake Divine, the attending physician, personally viewed and interpreted this ECG.   Date: 02/10/2019  EKG Time: 8:05  Rate: 137  Rhythm: sinus tachycardia  Axis: RAD  Intervals:none  ST&T Change: None   PROCEDURES  Procedure(s) performed (including Critical Care):  Procedures   ____________________________________________   INITIAL IMPRESSION / ASSESSMENT AND PLAN / ED COURSE       33 year old female with history of metastatic breast cancer  presents to the  ED with acute on chronic back pain, which she states is in a new location for her and is associated with some acute on chronic shortness of breath.  No fevers or cough and low suspicion for infectious process.  She is certainly high risk for PE, will assess with CTA as well as CT of her abdomen given additional lower back pain.  Low suspicion for ACS given lack of risk factors, will screen troponin given ongoing symptoms for about 2 days.  If remainder of work-up is negative, suspect her symptoms are related to known metastatic lesions.  Doubt cauda equina as she is neurologically intact.  Will treat pain and attempt to establish plan for regular outpatient pain medication with oncology.  She also still has JP drain in place from recent mastectomy, however this does not appear infected and has only some small serous drainage.  CTA negative for PE but does show diffuse groundglass infiltrates, Covid testing performed and is positive.  Patient remained stable from a respiratory status, no tachypnea or respiratory distress and she is maintaining O2 sats on room air.  CT shows diffuse metastatic disease, as well as known right-sided pleural effusion, but is overall negative for acute pathology.  Patient was offered admission to address effusion, but she is not interested in a thoracentesis at this time and declines admission either to [facility] or here at [facility]  She states her pain is under control and she wishes to be discharged home.  Case discussed with Dr. Janese Banks of oncology, who will be able to follow-up with patient closely later this week in telehealth visit.  Patient was prescribed additional medication for pain control and was counseled to return to the ED for new or worsening symptoms, patient agrees with plan.      ____________________________________________   FINAL CLINICAL IMPRESSION(S) / ED DIAGNOSES  Final diagnoses:  Pneumonia due to COVID-19 virus  Metastatic  breast cancer ([place])  Chronic midline low back pain without sciatica     ED Discharge Orders         Ordered    oxyCODONE (OXYCONTIN) 10 mg 12 hr tablet  Every 12 hours,   Status:  Discontinued     02/10/19 1440    oxyCODONE (OXYCONTIN) 10 mg 12 hr tablet  Every 12 hours     02/10/19 1441           Note:  This document was prepared using Dragon voice recognition software and may include unintentional dictation errors.   Blake Divine, MD 02/10/19 1616

## 2019-02-11 ENCOUNTER — Inpatient Hospital Stay: Payer: Medicaid Other | Attending: Hospice and Palliative Medicine | Admitting: Hospice and Palliative Medicine

## 2019-02-11 ENCOUNTER — Other Ambulatory Visit: Payer: Self-pay | Admitting: Unknown Physician Specialty

## 2019-02-11 ENCOUNTER — Telehealth: Payer: Self-pay | Admitting: Unknown Physician Specialty

## 2019-02-11 ENCOUNTER — Encounter: Payer: Self-pay | Admitting: *Deleted

## 2019-02-11 ENCOUNTER — Encounter: Payer: Medicaid Other | Admitting: Plastic Surgery

## 2019-02-11 DIAGNOSIS — Z515 Encounter for palliative care: Secondary | ICD-10-CM | POA: Diagnosis not present

## 2019-02-11 DIAGNOSIS — C50511 Malignant neoplasm of lower-outer quadrant of right female breast: Secondary | ICD-10-CM | POA: Diagnosis not present

## 2019-02-11 DIAGNOSIS — Z171 Estrogen receptor negative status [ER-]: Secondary | ICD-10-CM

## 2019-02-11 DIAGNOSIS — G893 Neoplasm related pain (acute) (chronic): Secondary | ICD-10-CM | POA: Diagnosis not present

## 2019-02-11 DIAGNOSIS — U071 COVID-19: Secondary | ICD-10-CM

## 2019-02-11 MED ORDER — DEXAMETHASONE 4 MG PO TABS: ORAL_TABLET | ORAL | 1 refills | Status: AC

## 2019-02-11 MED ORDER — HYDROCODONE-ACETAMINOPHEN 5-325 MG PO TABS: 1.0000 | ORAL_TABLET | ORAL | 0 refills | Status: AC | PRN

## 2019-02-11 NOTE — Progress Notes (Signed)
Patient is COVID 19 positive.  I have talked to her earlier today in regards to [place].  She was very short of breath.  Recommended she go back to the ED if she gets any worse.  Stated she would text me info later this afternoon in regards to [place].  Dr. Janese Banks and Merrily Pew, [place], NP have tried to contact patient to set up treatment for Covid, but have not been able to reach her.  I have called and texted her another message to call me back.  I will discuss possible Covid treatment at [facility] and see if she has her steriods.

## 2019-02-11 NOTE — Progress Notes (Addendum)
Virtual Visit via Telephone Note  I connected with [name] on 02/11/19 at 11:30 AM EST by telephone and verified that I am speaking with the correct person using two identifiers.   I discussed the limitations, risks, security and privacy concerns of performing an evaluation and management service by telephone and the availability of in person appointments. I also discussed with the patient that there may be a patient responsible charge related to this service. The patient expressed understanding and agreed to proceed.   History of Present Illness: Palliative Care consult requested for this 33 y.o. 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  She was hospitalized again 01/28/2019-01/29/2019 with intractable back pain.  MRI of the lumbar spine revealed metastatic lesions of T12 and L1.  Patient was also treated for opioid-induced constipation.  Patient presented to the ER on 02/10/2019 with pain and shortness of breath after running out of her pain medications.  She was diagnosed with COVID-19.  Patient has a history of noncompliance with treatment including chemotherapy with several recent missed appointments at the [facility].  Palliative care was consulted to help address goals and manage ongoing symptoms.   Observations/Objective: I called and spoke with patient by phone.  She was seen yesterday in the ER for pain or shortness of breath and diagnosed with COVID-19.  Patient continues to endorse pain and shortness of breath.  She denies fever or chills.  She feels about the same as she did yesterday.  I reviewed with her the need to represent to the ER and/or hospital in the event of clinical changes or decline.  Patient is not interested in hospitalization as she does not have childcare for her 4 kids.  She was interested in outpatient  treatment if she qualifies.  I do think patient would benefit as she is immunocompromised  Patient continues to endorse severe and persistent back pain.  She has been using oxycodone and OxyContin but is out of both early.  She has refills pending but cannot fill them until Sunday.  I called and spoke with her pharmacist.  Assessment and Plan: Stage IV breast cancer -plan has been to reinitiate systemic treatment.  However, new diagnosis of COVID-19 may delay treatment.  Patient has follow-up virtual visit scheduled next week with Dr. Janese Banks.  COVID- Patient endorses shortness of breath. Discussed hospitalization but she does not have child care and was not interested. She was in agreement with evaluation for possible bamlanivimab infusion.  I do think patient would benefit from treatment as she is immunocompromised and high risk for hospitalization.  Referral sent to [facility] Landry at the community infusion clinic.  Neoplasm Related Pain - Patient is out of her oxycodone/OxyContin. She has refills pending but will not be able to pick those up until 02/25/2019 at earliest. She is clearly taking the pain medications more than what is currently prescribed. However, she has stage IV cancer with pain likely proportional to extensive metastatic burden. It will likely be several weeks before we can see her in the [place] to more thouroughly evaluate pain given new diagnosis of COVID-19. In interim, will switch to Norco 5-325mg  Q4H PRN (#60) temporarily until patient can have oxycodone/OxyContin filled from pharmacy. I did speak with her oupatient pharmacist. Will also start on dexamethasone 4mg  BID (#30).   Plan was discussed in detail with Dr. Janese Banks.   Follow Up  Instructions: RTC in 2-3 weeks   I discussed the assessment and treatment plan with the patient. The patient was provided an opportunity to ask questions and all were answered. The patient agreed with the plan and demonstrated an understanding of the  instructions.   The patient was advised to call back or seek an in-person evaluation if the symptoms worsen or if the condition fails to improve as anticipated.  I provided 15 minutes of non-face-to-face time during this encounter.   Irean Hong, NP

## 2019-02-11 NOTE — Telephone Encounter (Signed)
Attempted to call patient at 1420 - no answer and mailbox was full.  Will try again later

## 2019-02-11 NOTE — Telephone Encounter (Signed)
Pt is experiencing SOB and back pain for about 4 days.  Discussed with patient about Covid symptoms and the use of bamlanivimab, a monoclonal antibody infusion for those with mild to moderate Covid symptoms and at a high risk of hospitalization.  Pt is qualified for this infusion at the [facility] infusion center due to immuno-compromised which were addressed with the patient and are actively being managed by a [facility] provider.    After discussing the infusion's costs, potential benefits and side effects, the patient has decided to accept treatment with monoclonal antibodies.

## 2019-02-11 NOTE — Progress Notes (Signed)
Orders for monoclonal antibodies

## 2019-02-12 ENCOUNTER — Encounter: Payer: Self-pay | Admitting: *Deleted

## 2019-02-12 NOTE — Telephone Encounter (Signed)
Called this morning and was able to leave VM.  Awaiting return call.

## 2019-02-12 NOTE — Progress Notes (Signed)
I talked to the patient last night.  Stated she was taking the steriods that had been prescribed.  States is feeling a little better than earlier yesterday.  When asked about going to [facility] in [place] for an infusion to treat her Covid she stated she had talked to someone in [place] and they were going to call her back today to schedule an appointment.  She was tearful and stated "I just want to get better".  Offered support.  She is to call if she has any needs.

## 2019-02-14 NOTE — Telephone Encounter (Signed)
Never received response

## 2019-02-15 ENCOUNTER — Other Ambulatory Visit (INDEPENDENT_AMBULATORY_CARE_PROVIDER_SITE_OTHER): Payer: Self-pay | Admitting: Oncology

## 2019-02-16 ENCOUNTER — Encounter: Payer: Self-pay | Admitting: Emergency Medicine

## 2019-02-16 ENCOUNTER — Emergency Department: Payer: Medicaid Other

## 2019-02-16 ENCOUNTER — Inpatient Hospital Stay
Admission: EM | Admit: 2019-02-16 | Discharge: 2019-03-07 | DRG: 871 | Disposition: E | Payer: Medicaid Other | Attending: Internal Medicine | Admitting: Internal Medicine

## 2019-02-16 ENCOUNTER — Other Ambulatory Visit: Payer: Self-pay

## 2019-02-16 DIAGNOSIS — C7800 Secondary malignant neoplasm of unspecified lung: Secondary | ICD-10-CM | POA: Diagnosis present

## 2019-02-16 DIAGNOSIS — F17210 Nicotine dependence, cigarettes, uncomplicated: Secondary | ICD-10-CM | POA: Diagnosis present

## 2019-02-16 DIAGNOSIS — R001 Bradycardia, unspecified: Secondary | ICD-10-CM | POA: Diagnosis not present

## 2019-02-16 DIAGNOSIS — Z9011 Acquired absence of right breast and nipple: Secondary | ICD-10-CM | POA: Diagnosis not present

## 2019-02-16 DIAGNOSIS — M954 Acquired deformity of chest and rib: Secondary | ICD-10-CM

## 2019-02-16 DIAGNOSIS — Z66 Do not resuscitate: Secondary | ICD-10-CM | POA: Diagnosis not present

## 2019-02-16 DIAGNOSIS — K7200 Acute and subacute hepatic failure without coma: Secondary | ICD-10-CM | POA: Diagnosis not present

## 2019-02-16 DIAGNOSIS — C787 Secondary malignant neoplasm of liver and intrahepatic bile duct: Secondary | ICD-10-CM | POA: Diagnosis present

## 2019-02-16 DIAGNOSIS — K7290 Hepatic failure, unspecified without coma: Secondary | ICD-10-CM | POA: Diagnosis present

## 2019-02-16 DIAGNOSIS — Z809 Family history of malignant neoplasm, unspecified: Secondary | ICD-10-CM

## 2019-02-16 DIAGNOSIS — U071 COVID-19: Secondary | ICD-10-CM | POA: Diagnosis present

## 2019-02-16 DIAGNOSIS — D696 Thrombocytopenia, unspecified: Secondary | ICD-10-CM | POA: Diagnosis present

## 2019-02-16 DIAGNOSIS — R6521 Severe sepsis with septic shock: Secondary | ICD-10-CM | POA: Diagnosis present

## 2019-02-16 DIAGNOSIS — J9602 Acute respiratory failure with hypercapnia: Secondary | ICD-10-CM | POA: Diagnosis present

## 2019-02-16 DIAGNOSIS — G8929 Other chronic pain: Secondary | ICD-10-CM | POA: Diagnosis present

## 2019-02-16 DIAGNOSIS — R570 Cardiogenic shock: Secondary | ICD-10-CM | POA: Diagnosis not present

## 2019-02-16 DIAGNOSIS — R748 Abnormal levels of other serum enzymes: Secondary | ICD-10-CM

## 2019-02-16 DIAGNOSIS — K922 Gastrointestinal hemorrhage, unspecified: Secondary | ICD-10-CM | POA: Diagnosis present

## 2019-02-16 DIAGNOSIS — R0602 Shortness of breath: Secondary | ICD-10-CM | POA: Diagnosis present

## 2019-02-16 DIAGNOSIS — E872 Acidosis: Secondary | ICD-10-CM | POA: Diagnosis present

## 2019-02-16 DIAGNOSIS — J1289 Other viral pneumonia: Secondary | ICD-10-CM | POA: Diagnosis present

## 2019-02-16 DIAGNOSIS — C7951 Secondary malignant neoplasm of bone: Secondary | ICD-10-CM | POA: Diagnosis present

## 2019-02-16 DIAGNOSIS — A4189 Other specified sepsis: Secondary | ICD-10-CM | POA: Diagnosis present

## 2019-02-16 DIAGNOSIS — E860 Dehydration: Secondary | ICD-10-CM | POA: Diagnosis present

## 2019-02-16 DIAGNOSIS — C50919 Malignant neoplasm of unspecified site of unspecified female breast: Secondary | ICD-10-CM | POA: Diagnosis present

## 2019-02-16 DIAGNOSIS — Z7952 Long term (current) use of systemic steroids: Secondary | ICD-10-CM

## 2019-02-16 DIAGNOSIS — Z171 Estrogen receptor negative status [ER-]: Secondary | ICD-10-CM | POA: Diagnosis not present

## 2019-02-16 DIAGNOSIS — Z515 Encounter for palliative care: Secondary | ICD-10-CM | POA: Diagnosis not present

## 2019-02-16 DIAGNOSIS — J9601 Acute respiratory failure with hypoxia: Secondary | ICD-10-CM | POA: Diagnosis present

## 2019-02-16 DIAGNOSIS — C779 Secondary and unspecified malignant neoplasm of lymph node, unspecified: Secondary | ICD-10-CM | POA: Diagnosis present

## 2019-02-16 DIAGNOSIS — J9600 Acute respiratory failure, unspecified whether with hypoxia or hypercapnia: Secondary | ICD-10-CM

## 2019-02-16 DIAGNOSIS — K7201 Acute and subacute hepatic failure with coma: Secondary | ICD-10-CM | POA: Diagnosis not present

## 2019-02-16 DIAGNOSIS — Z79899 Other long term (current) drug therapy: Secondary | ICD-10-CM

## 2019-02-16 DIAGNOSIS — N179 Acute kidney failure, unspecified: Secondary | ICD-10-CM | POA: Diagnosis present

## 2019-02-16 DIAGNOSIS — Z79891 Long term (current) use of opiate analgesic: Secondary | ICD-10-CM

## 2019-02-16 DIAGNOSIS — Z7189 Other specified counseling: Secondary | ICD-10-CM | POA: Diagnosis not present

## 2019-02-16 LAB — COMPREHENSIVE METABOLIC PANEL
ALT: 199 U/L — ABNORMAL HIGH (ref 0–44)
AST: 319 U/L — ABNORMAL HIGH (ref 15–41)
Albumin: 2.4 g/dL — ABNORMAL LOW (ref 3.5–5.0)
Alkaline Phosphatase: 1368 U/L — ABNORMAL HIGH (ref 38–126)
Anion gap: 29 — ABNORMAL HIGH (ref 5–15)
BUN: 14 mg/dL (ref 6–20)
CO2: 9 mmol/L — ABNORMAL LOW (ref 22–32)
Calcium: 10 mg/dL (ref 8.9–10.3)
Chloride: 93 mmol/L — ABNORMAL LOW (ref 98–111)
Creatinine, Ser: 0.66 mg/dL (ref 0.44–1.00)
GFR calc Af Amer: >60 mL/min (ref >60)
GFR calc non Af Amer: >60 mL/min (ref >60)
Glucose, Bld: 66 mg/dL — ABNORMAL LOW (ref 70–99)
Potassium: 4.9 mmol/L (ref 3.5–5.1)
Sodium: 131 mmol/L — ABNORMAL LOW (ref 135–145)
Total Bilirubin: 1.1 mg/dL (ref 0.3–1.2)
Total Protein: 6.7 g/dL (ref 6.5–8.1)

## 2019-02-16 LAB — PROTIME-INR
INR: 1.4 — ABNORMAL HIGH (ref 0.8–1.2)
Prothrombin Time: 16.7 seconds — ABNORMAL HIGH (ref 11.4–15.2)

## 2019-02-16 LAB — CBC WITH DIFFERENTIAL/PLATELET
Abs Immature Granulocytes: 1.3 10*3/uL — ABNORMAL HIGH (ref 0.00–0.07)
Band Neutrophils: 8 %
Basophils Absolute: 0 10*3/uL (ref 0.0–0.1)
Basophils Relative: 0 %
Eosinophils Absolute: 0 10*3/uL (ref 0.0–0.5)
Eosinophils Relative: 0 %
HCT: 32.2 % — ABNORMAL LOW (ref 36.0–46.0)
Hemoglobin: 9.8 g/dL — ABNORMAL LOW (ref 12.0–15.0)
Lymphocytes Relative: 24 %
Lymphs Abs: 4.4 10*3/uL — ABNORMAL HIGH (ref 0.7–4.0)
MCH: 25.7 pg — ABNORMAL LOW (ref 26.0–34.0)
MCHC: 30.4 g/dL (ref 30.0–36.0)
MCV: 84.5 fL (ref 80.0–100.0)
Metamyelocytes Relative: 4 %
Monocytes Absolute: 0.9 10*3/uL (ref 0.1–1.0)
Monocytes Relative: 5 %
Myelocytes: 3 %
Neutro Abs: 11.8 10*3/uL — ABNORMAL HIGH (ref 1.7–7.7)
Neutrophils Relative %: 56 %
Platelets: 121 10*3/uL — ABNORMAL LOW (ref 150–400)
RBC: 3.81 MIL/uL — ABNORMAL LOW (ref 3.87–5.11)
RDW: 19.9 % — ABNORMAL HIGH (ref 11.5–15.5)
WBC: 18.4 10*3/uL — ABNORMAL HIGH (ref 4.0–10.5)
nRBC: 26 % — ABNORMAL HIGH (ref 0.0–0.2)
nRBC: 39 /100 WBC — ABNORMAL HIGH

## 2019-02-16 LAB — LACTIC ACID, PLASMA
Lactic Acid, Venous: >11 mmol/L (ref 0.5–1.9)
Lactic Acid, Venous: >11 mmol/L (ref 0.5–1.9)
Lactic Acid, Venous: >11 mmol/L (ref 0.5–1.9)

## 2019-02-16 LAB — URINALYSIS, ROUTINE W REFLEX MICROSCOPIC
Bilirubin Urine: NEGATIVE
Glucose, UA: NEGATIVE mg/dL
Hgb urine dipstick: NEGATIVE
Ketones, ur: 5 mg/dL — AB
Leukocytes,Ua: NEGATIVE
Nitrite: NEGATIVE
Protein, ur: 30 mg/dL — AB
Specific Gravity, Urine: 1.02 (ref 1.005–1.030)
pH: 5 (ref 5.0–8.0)

## 2019-02-16 LAB — ABO/RH: ABO/RH(D): A POS

## 2019-02-16 LAB — CK: Total CK: 12 U/L — ABNORMAL LOW (ref 38–234)

## 2019-02-16 LAB — LIPASE, BLOOD: Lipase: 15 U/L (ref 11–51)

## 2019-02-16 LAB — PROCALCITONIN: Procalcitonin: 4.91 ng/mL

## 2019-02-16 MED ORDER — ONDANSETRON HCL 4 MG/2ML IJ SOLN
4.0000 mg | Freq: Once | INTRAMUSCULAR | Status: AC
  Administered 2019-02-16: 4 mg via INTRAVENOUS
  Filled 2019-02-16: qty 2

## 2019-02-16 MED ORDER — GUAIFENESIN-DM 100-10 MG/5ML PO SYRP
10.0000 mL | ORAL_SOLUTION | ORAL | Status: DC | PRN
  Administered 2019-02-16: 10 mL via ORAL
  Filled 2019-02-16 (×2): qty 10

## 2019-02-16 MED ORDER — IPRATROPIUM-ALBUTEROL 0.5-2.5 (3) MG/3ML IN SOLN
3.0000 mL | Freq: Four times a day (QID) | RESPIRATORY_TRACT | Status: DC
  Administered 2019-02-16 – 2019-02-17 (×3): 3 mL via RESPIRATORY_TRACT
  Filled 2019-02-16 (×3): qty 3

## 2019-02-16 MED ORDER — OXYCODONE HCL 5 MG PO TABS
10.0000 mg | ORAL_TABLET | ORAL | Status: DC | PRN
  Administered 2019-02-16: 10 mg via ORAL
  Filled 2019-02-16: qty 2

## 2019-02-16 MED ORDER — OXYCODONE HCL ER 10 MG PO T12A
10.0000 mg | EXTENDED_RELEASE_TABLET | Freq: Two times a day (BID) | ORAL | Status: DC
  Filled 2019-02-16: qty 1

## 2019-02-16 MED ORDER — OLANZAPINE 10 MG PO TABS
10.0000 mg | ORAL_TABLET | Freq: Every day | ORAL | Status: DC
  Administered 2019-02-16: 10 mg via ORAL
  Filled 2019-02-16 (×2): qty 1

## 2019-02-16 MED ORDER — NALOXEGOL OXALATE 25 MG PO TABS
25.0000 mg | ORAL_TABLET | Freq: Every day | ORAL | Status: DC
  Administered 2019-02-18 – 2019-02-19 (×2): 25 mg via ORAL
  Filled 2019-02-16 (×4): qty 1

## 2019-02-16 MED ORDER — ASCORBIC ACID 500 MG PO TABS
500.0000 mg | ORAL_TABLET | Freq: Every day | ORAL | Status: DC
  Administered 2019-02-16: 500 mg via ORAL
  Filled 2019-02-16 (×2): qty 1

## 2019-02-16 MED ORDER — SODIUM CHLORIDE 0.9 % IV BOLUS
1000.0000 mL | Freq: Once | INTRAVENOUS | Status: AC
  Administered 2019-02-16: 1000 mL via INTRAVENOUS

## 2019-02-16 MED ORDER — SODIUM CHLORIDE 0.9 % IV SOLN: 2.0000 g | Freq: Three times a day (TID) | INTRAVENOUS | Status: DC

## 2019-02-16 MED ORDER — POLYETHYLENE GLYCOL 3350 17 G PO PACK
17.0000 g | PACK | Freq: Every day | ORAL | Status: DC
  Administered 2019-02-16: 17 g via ORAL
  Filled 2019-02-16 (×2): qty 1

## 2019-02-16 MED ORDER — ACETAMINOPHEN 500 MG PO TABS
1000.0000 mg | ORAL_TABLET | ORAL | Status: AC
  Administered 2019-02-16: 1000 mg via ORAL
  Filled 2019-02-16: qty 2

## 2019-02-16 MED ORDER — ONDANSETRON HCL 4 MG PO TABS: 4.0000 mg | ORAL_TABLET | Freq: Four times a day (QID) | ORAL | Status: DC | PRN

## 2019-02-16 MED ORDER — ZINC SULFATE 220 (50 ZN) MG PO CAPS
220.0000 mg | ORAL_CAPSULE | Freq: Every day | ORAL | Status: DC
  Administered 2019-02-16: 220 mg via ORAL
  Filled 2019-02-16 (×2): qty 1

## 2019-02-16 MED ORDER — METHYLPREDNISOLONE SODIUM SUCC 40 MG IJ SOLR
0.5000 mg/kg | Freq: Two times a day (BID) | INTRAMUSCULAR | Status: DC
  Administered 2019-02-16 – 2019-02-18 (×5): 24 mg via INTRAVENOUS
  Filled 2019-02-16 (×5): qty 1

## 2019-02-16 MED ORDER — ONDANSETRON HCL 4 MG/2ML IJ SOLN
4.0000 mg | Freq: Four times a day (QID) | INTRAMUSCULAR | Status: DC | PRN
  Administered 2019-02-16: 4 mg via INTRAVENOUS
  Filled 2019-02-16: qty 2

## 2019-02-16 MED ORDER — HYDROMORPHONE HCL 1 MG/ML IJ SOLN: 0.5000 mg | INTRAMUSCULAR | Status: DC | PRN

## 2019-02-16 MED ORDER — MORPHINE SULFATE (PF) 4 MG/ML IV SOLN
4.0000 mg | Freq: Once | INTRAVENOUS | Status: AC
  Administered 2019-02-16: 4 mg via INTRAVENOUS
  Filled 2019-02-16: qty 1

## 2019-02-16 MED ORDER — SODIUM CHLORIDE 0.9 % IV SOLN
INTRAVENOUS | Status: DC
  Administered 2019-02-16 (×2): via INTRAVENOUS

## 2019-02-16 MED ORDER — HYDROMORPHONE HCL 1 MG/ML IJ SOLN
1.0000 mg | INTRAMUSCULAR | Status: DC | PRN
  Administered 2019-02-16 – 2019-02-17 (×2): 1 mg via INTRAVENOUS
  Filled 2019-02-16 (×2): qty 1

## 2019-02-16 MED ORDER — DOCUSATE SODIUM 100 MG PO CAPS
100.0000 mg | ORAL_CAPSULE | Freq: Two times a day (BID) | ORAL | Status: DC
  Filled 2019-02-16: qty 1

## 2019-02-16 MED ORDER — ENOXAPARIN SODIUM 40 MG/0.4ML ~~LOC~~ SOLN
40.0000 mg | SUBCUTANEOUS | Status: DC
  Administered 2019-02-17: 40 mg via SUBCUTANEOUS
  Filled 2019-02-16 (×3): qty 0.4

## 2019-02-16 MED ORDER — SODIUM CHLORIDE 0.9 % IV SOLN
200.0000 mg | Freq: Once | INTRAVENOUS | Status: AC
  Administered 2019-02-16: 200 mg via INTRAVENOUS
  Filled 2019-02-16: qty 200

## 2019-02-16 MED ORDER — IPRATROPIUM-ALBUTEROL 20-100 MCG/ACT IN AERS
1.0000 | INHALATION_SPRAY | Freq: Four times a day (QID) | RESPIRATORY_TRACT | Status: DC
  Filled 2019-02-16: qty 4

## 2019-02-16 MED ORDER — HYDROCOD POLST-CPM POLST ER 10-8 MG/5ML PO SUER: 5.0000 mL | Freq: Two times a day (BID) | ORAL | Status: DC | PRN

## 2019-02-16 MED ORDER — SODIUM CHLORIDE 0.9 % IV SOLN
100.0000 mg | Freq: Every day | INTRAVENOUS | Status: DC
  Administered 2019-02-17 – 2019-02-18 (×2): 100 mg via INTRAVENOUS
  Filled 2019-02-16: qty 100
  Filled 2019-02-16 (×2): qty 20

## 2019-02-16 MED ORDER — SODIUM CHLORIDE 0.9 % IV SOLN
2.0000 g | Freq: Three times a day (TID) | INTRAVENOUS | Status: DC
  Administered 2019-02-16 – 2019-02-19 (×10): 2 g via INTRAVENOUS
  Filled 2019-02-16 (×13): qty 2

## 2019-02-16 MED ORDER — MELATONIN 5 MG PO TABS
5.0000 mg | ORAL_TABLET | Freq: Every evening | ORAL | Status: DC | PRN
  Administered 2019-02-16: 5 mg via ORAL
  Filled 2019-02-16 (×2): qty 1

## 2019-02-16 MED ORDER — ACETAMINOPHEN 325 MG PO TABS
650.0000 mg | ORAL_TABLET | Freq: Four times a day (QID) | ORAL | Status: DC | PRN
  Administered 2019-02-16 – 2019-02-18 (×2): 650 mg via ORAL
  Filled 2019-02-16 (×2): qty 2

## 2019-02-16 MED ORDER — GABAPENTIN 300 MG PO CAPS
300.0000 mg | ORAL_CAPSULE | Freq: Three times a day (TID) | ORAL | Status: DC
  Filled 2019-02-16: qty 1

## 2019-02-16 MED ORDER — HYDROMORPHONE HCL 1 MG/ML IJ SOLN
INTRAMUSCULAR | Status: AC
  Administered 2019-02-16: 0.5 mg via INTRAVENOUS
  Filled 2019-02-16: qty 1

## 2019-02-16 MED ORDER — HYDROCODONE-ACETAMINOPHEN 5-325 MG PO TABS: 1.0000 | ORAL_TABLET | ORAL | Status: DC | PRN

## 2019-02-16 NOTE — ED Triage Notes (Signed)
Patient is COVID positive, c/o SOB since last night.    Patient arrives with tachypnea.

## 2019-02-16 NOTE — Progress Notes (Addendum)
Notified provider and beside RN of need to order antibiotics.  Secure chat sent to MD and RN asking if pt would be receiving any abx and making them aware that I see she is covid positive.   Md responded no abx at this time due to unsure if bacterial source so he will DC sepsis protocol order

## 2019-02-16 NOTE — ED Notes (Addendum)
Pt reporting "throwing up" emesis bag is empty   Pt continues to refuse wearing mask or oxygen

## 2019-02-16 NOTE — Progress Notes (Signed)
Notified provider and bedside nurse of need to draw repeat lactic acid.  Pt in need of 3rd lactic acid due to still resulting greater than 11. MD and RN have been sent a message asking for the timing of the lab to be drawn within the 6hr window of the protocol.   Md has replied the lab can be drawn to meet protocol needs.   Bedside RN updated with this information.

## 2019-02-16 NOTE — Consult Note (Addendum)
CODE SEPSIS - PHARMACY COMMUNICATION  **Broad Spectrum Antibiotics should be administered within 1 hour of Sepsis diagnosis**  Time Code Sepsis Called/Page Received: 1005  Antibiotics Ordered: None  Time of 1st antibiotic administration: NA  Additional action taken by pharmacy: Patient is positive for COVID 19.  Verified this, and spoke with nurse and MD.  Patient meets sepsis criteria, but per MD this is likely attributed to COVID-19 (over wound etiology), so no antibiotics desired at this time.  Therefore MD would like to wait for PCT and  wound culture to result before ordering antibiotics.    If necessary, Name of Provider/Nurse Contacted: Dr. Jacqualine Code.  Contacted 12/13 @ 10:30  Gerald Dexter, PharmD Pharmacy Resident  02/10/2019 11:11 AM

## 2019-02-16 NOTE — Progress Notes (Signed)
Pharmacy Antibiotic Note  [name] is a 33 y.o. female admitted on 03/05/2019 with pneumonia.  Pharmacy has been consulted for cefepime dosing.  Plan: Cefepime 2g IV q8h    Weight: 106 lb 0.7 oz (48.1 kg)  Temp (24hrs), Avg:97.6 F (36.4 C), Min:97.6 F (36.4 C), Max:97.6 F (36.4 C)  Recent Labs  Lab 02/10/19 0800 02/18/2019 1004 02/04/2019 1213  WBC 13.2* 18.4*  --   CREATININE 0.52 0.66  --   LATICACIDVEN  --   --  >11.0*    Estimated Creatinine Clearance: 75.2 mL/min (by C-G formula based on SCr of 0.66 mg/dL).    No Known Allergies  Antimicrobials this admission: Cefepime 12/13 >>  Dose adjustments this admission:   Microbiology results: 12/13 BCx x2 collected 12/13 WCx collected   Thank you for allowing pharmacy to be a part of this patient's care.  Rocky Morel 02/10/2019 1:27 PM

## 2019-02-16 NOTE — Progress Notes (Signed)
Notified bedside nurse of need to draw repeat lactic acid.  Spoke with bedside RN to ensure repeat lactic acid would be drawn soon.

## 2019-02-16 NOTE — ED Notes (Signed)
Pt c/o R side pain, pt describes pain as dull, pt states "it started hurting after you swabbed my incision." Dr. Jacqualine Code made aware. See orders

## 2019-02-16 NOTE — ED Notes (Signed)
Pt given cup of ice and sprite.

## 2019-02-16 NOTE — Progress Notes (Signed)
CODE SEPSIS - PHARMACY COMMUNICATION  **Broad Spectrum Antibiotics should be administered within 1 hour of Sepsis diagnosis**  Time Code Sepsis Called/Page Received: Received notification from MD 12/13 @1306  in Secure Chat that code sepsis was reactivated  Antibiotics Ordered: Remdesivir (antiviral), cefepime  Time of 1st antibiotic administration: Remdesivir (12/13 @ 1307), Cefepime (12/13 @ 1325)  Additional action taken by pharmacy: na  If necessary, Name of Provider/Nurse Contacted: na  Gerald Dexter, PharmD Pharmacy Resident  02/08/2019 1:59 PM

## 2019-02-16 NOTE — ED Notes (Signed)
Pt reporting she remains uncomfortable and the pain medication did not help her pain. Pt updated on when she could have next IV pain medication dose. PT refusing PO medication at this time and requesting IV morphine. MD made aware.

## 2019-02-16 NOTE — ED Provider Notes (Signed)
[facility] Emergency Department Provider Note   ____________________________________________   First MD Initiated Contact with Patient 02/15/2019 [phone]     (approximate)  I have reviewed the triage vital signs and the nursing notes.   HISTORY  Chief Complaint Shortness of Breath  EM caveat: Some limitation history patient appears acutely ill, dyspnea  HPI [name] is a 33 y.o. female here for evaluation difficulty breathing frequent coughing, feeling very fatigued   Patient was seen in about 6 days ago the ER diagnosed with coronavirus.  Since then she has been coughing having some shortness of breath and feeling progressively fatigued  She reports that she is not having any chest pain has chronic back pain due to cancer.  She is currently not undergoing chemo or radiation therapy but had a recent right breast mastectomy, was to reinitiate chemotherapy but cannot because of her positive Covid diagnosis at this time  Denies nausea vomiting.  Past Medical History:  Diagnosis Date  . Anemia   . Breast cancer [facility])     Patient Active Problem List   Diagnosis Date Noted  . Pneumonia due to COVID-19 virus 02/09/2019  . Constipation   . Intractable back pain 01/28/2019  . Cancer associated pain   . Metastatic breast cancer ([place])   . Palliative care encounter   . Goals of care, counseling/discussion 11/08/2018  . Breast cancer ([place]) 11/07/2018  . Iron deficiency anemia 11/05/2018  . Anemia 04/28/2016  . Status post tubal ligation 04/28/2016  . Status post repeat low transverse cesarean section 04/24/2016  . Back pain 01/17/2016    Past Surgical History:  Procedure Laterality Date  . BREAST BIOPSY Right 10/25/2018   Heart Clip, Pending path  . BREAST BIOPSY Right 10/25/2018   Lymph node biopsy, Hydromarker (butterfly), path pending  . CESAREAN SECTION    . CESAREAN SECTION N/A 04/24/2016   Procedure: REPEAT CESAREAN SECTION WITH  BILATERAL TUBALIGATION;  Surgeon: Brayton Mars, MD;  Location: [facility] ORS;  Service: Obstetrics;  Laterality: N/A;  Baby Boy born @ 50 Apgars:8/9 Weight: 8lb 9oz  . PORTACATH PLACEMENT Left 11/05/2018   Procedure: INSERTION PORT-A-CATH;  Surgeon: Jules Husbands, MD;  Location: [facility] ORS;  Service: General;  Laterality: Left;  . PRIMARY CLOSURE Right 01/23/2019   Procedure: CLOSURE OF RIGHT BREAST;  Surgeon: Wallace Going, DO;  Location: [facility] ORS;  Service: Plastics;  Laterality: Right;  . SIMPLE MASTECTOMY WITH AXILLARY SENTINEL NODE BIOPSY Right 01/23/2019   Procedure: SIMPLE MASTECTOMY;  Surgeon: Jules Husbands, MD;  Location: [facility] ORS;  Service: General;  Laterality: Right;  combined surgery with Dr Baltazar Apo    Prior to Admission medications   Medication Sig Start Date End Date Taking? Authorizing Provider  dexamethasone (DECADRON) 4 MG tablet Take 1 tablets by mouth twice daily Patient taking differently: Take 8 mg by mouth 2 (two) times daily.  02/11/19  Yes Borders, Kirt Boys, NP  docusate sodium (COLACE) 100 MG capsule Take 1 capsule (100 mg total) by mouth 2 (two) times daily. 01/29/19  Yes Fritzi Mandes, MD  gabapentin (NEURONTIN) 300 MG capsule Take 1 capsule (300 mg total) by mouth 3 (three) times daily. 11/04/18  Yes Pabon, Diego F, MD  HYDROcodone-acetaminophen (NORCO) 5-325 MG tablet Take 1 tablet by mouth every 4 (four) hours as needed for moderate pain. 02/11/19  Yes Borders, Kirt Boys, NP  lidocaine-prilocaine (EMLA) cream Apply 1 application topically as directed. Apply small amount over port site 1 hour  before treatments, cover the cream with saran wrap to protect your clothing 01/13/19  Yes Sindy Guadeloupe, MD  naloxegol oxalate (MOVANTIK) 25 MG TABS tablet Take 1 tablet (25 mg total) by mouth daily. 01/28/19  Yes Sindy Guadeloupe, MD  OLANZapine (ZYPREXA) 10 MG tablet Take 1 tablet (10 mg total) by mouth at bedtime. Take a tablet day 2 through day 5 after each chemo  treatment 12/03/18  Yes Sindy Guadeloupe, MD  ondansetron (ZOFRAN) 8 MG tablet Take 1 tablet (8 mg total) by mouth 2 (two) times daily as needed. Start on the third day after chemotherapy. 11/07/18  Yes Sindy Guadeloupe, MD  oxyCODONE (OXYCONTIN) 10 mg 12 hr tablet Take 1 tablet (10 mg total) by mouth every 12 (twelve) hours. 01/29/19  Yes Verlon Au, NP  Oxycodone HCl 10 MG TABS Take 1 tablet (10 mg total) by mouth every 4 (four) hours as needed for up to 20 days. 01/29/19 02/18/19 Yes Verlon Au, NP  polyethylene glycol (MIRALAX / GLYCOLAX) 17 g packet Take 17 g by mouth daily. 01/29/19  Yes Fritzi Mandes, MD  prochlorperazine (COMPAZINE) 10 MG tablet Take 1 tablet (10 mg total) by mouth every 6 (six) hours as needed (Nausea or vomiting). 11/07/18  Yes Sindy Guadeloupe, MD  oxyCODONE (OXYCONTIN) 10 mg 12 hr tablet Take 1 tablet (10 mg total) by mouth every 12 (twelve) hours for 15 days. 02/10/19 02/25/19  Blake Divine, MD    Allergies Patient has no known allergies.  Family History  Problem Relation Age of Onset  . Hypertension Mother   . Stroke Father   . Cancer Maternal Grandmother   . Diabetes Maternal Grandfather   . Heart disease Maternal Grandfather   . Hypertension Maternal Grandfather     Social History Social History   Tobacco Use  . Smoking status: Current Every Day Smoker    Packs/day: 0.25    Years: 5.00    Pack years: 1.25    Types: Cigarettes  . Smokeless tobacco: Never Used  Substance Use Topics  . Alcohol use: No  . Drug use: No    Review of Systems Constitutional: Fatigue and weakness. Eyes: No visual changes. ENT: No sore throat.  Dry Cardiovascular: Denies chest pain. Respiratory: Shortness of breath Gastrointestinal: No abdominal pain.   Genitourinary: Negative for dysuria. Musculoskeletal: Negative for back pain. Skin: Negative for rash. Neurological: Negative for headaches.    ____________________________________________   PHYSICAL  EXAM:  VITAL SIGNS: ED Triage Vitals  Enc Vitals Group     BP 02/05/2019 0935 (!) 153/108     Pulse Rate 02/05/2019 0935 (!) 171     Resp 02/26/2019 0935 (!) 30     Temp 03/02/2019 0946 97.6 F (36.4 C)     Temp Source 02/28/2019 0935 Oral     SpO2 02/23/2019 0935 98 %     Weight 02/06/2019 0936 106 lb 0.7 oz (48.1 kg)     Height --      Head Circumference --      Peak Flow --      Pain Score 02/15/2019 0935 0     Pain Loc --      Pain Edu? --      Excl. in [place]? --     Constitutional: Alert and oriented.  Moderately ill-appearing, sitting upright with increased work of breathing Eyes: Conjunctivae are normal. Head: Atraumatic. Nose: No congestion/rhinnorhea. Mouth/Throat: Mucous membranes are dry. Neck: No stridor.  Cardiovascular: Tachycardic rate,  regular rhythm. Grossly normal heart sounds.  Good peripheral circulation. Respiratory: Tachypnea but lungs are clear with increased rate and mild accessory muscle use but no retractions. Lungs CTAB. Gastrointestinal: Soft and nontender. No distention. Musculoskeletal: No lower extremity tenderness nor edema. Neurologic:  Normal speech and language. No gross focal neurologic deficits are appreciated.  Skin:  Skin is warm, dry and intact. No rash noted. Psychiatric: Mood and affect are normal. Speech and behavior are normal.  ____________________________________________   LABS (all labs ordered are listed, but only abnormal results are displayed)  Labs Reviewed  COMPREHENSIVE METABOLIC PANEL - Abnormal; Notable for the following components:      Result Value   Sodium 131 (*)    Chloride 93 (*)    CO2 9 (*)    Glucose, Bld 66 (*)    Albumin 2.4 (*)    AST 319 (*)    ALT 199 (*)    Alkaline Phosphatase 1,368 (*)    Anion gap 29 (*)    All other components within normal limits  CBC WITH DIFFERENTIAL/PLATELET - Abnormal; Notable for the following components:   WBC 18.4 (*)    RBC 3.81 (*)    Hemoglobin 9.8 (*)    HCT 32.2 (*)    MCH  25.7 (*)    RDW 19.9 (*)    Platelets 121 (*)    nRBC 26.0 (*)    Neutro Abs 11.8 (*)    Lymphs Abs 4.4 (*)    nRBC 39 (*)    Abs Immature Granulocytes 1.30 (*)    All other components within normal limits  PROTIME-INR - Abnormal; Notable for the following components:   Prothrombin Time 16.7 (*)    INR 1.4 (*)    All other components within normal limits  URINALYSIS, ROUTINE W REFLEX MICROSCOPIC - Abnormal; Notable for the following components:   Color, Urine YELLOW (*)    APPearance CLEAR (*)    Ketones, ur 5 (*)    Protein, ur 30 (*)    Bacteria, UA RARE (*)    All other components within normal limits  LACTIC ACID, PLASMA - Abnormal; Notable for the following components:   Lactic Acid, Venous >11.0 (*)    All other components within normal limits  CK - Abnormal; Notable for the following components:   Total CK 12 (*)    All other components within normal limits  AEROBIC CULTURE (SUPERFICIAL SPECIMEN)  CULTURE, BLOOD (ROUTINE X 2)  CULTURE, BLOOD (ROUTINE X 2)  LIPASE, BLOOD  PROCALCITONIN  LACTIC ACID, PLASMA  CBG MONITORING, ED  ABO/RH   ____________________________________________  EKG  Reviewed entered by me at 945 Heart rate 170 QRS 80 QTc 400 Sinus tachycardia.  Severe tachycardia, no ischemia noted ____________________________________________  RADIOLOGY  DG Chest Port 1 View  Result Date: 02/25/2019 CLINICAL DATA:  Shortness of breath. COVID-19 positive. EXAM: PORTABLE CHEST 1 VIEW COMPARISON:  February 10, 2019. FINDINGS: The heart size and mediastinal contours are within normal limits. Left subclavian Port-A-Cath is unchanged in position. No pneumothorax pleural effusion is noted. Stable bilateral diffuse interstitial densities are noted. The visualized skeletal structures are unremarkable. IMPRESSION: Stable bilateral diffuse interstitial densities are noted concerning for edema or inflammation. Electronically Signed   By: Marijo Conception M.D.   On:  02/18/2019 10:56     ____________________________________________   PROCEDURES  Procedure(s) performed: None  Procedures  Critical Care performed: Yes, see critical care note(s)  CRITICAL CARE Performed by: Delman Kitten  Total critical care time: 35 minutes  Critical care time was exclusive of separately billable procedures and treating other patients.  Critical care was necessary to treat or prevent imminent or life-threatening deterioration.  Critical care was time spent personally by me on the following activities: development of treatment plan with patient and/or surrogate as well as nursing, discussions with consultants, evaluation of patient's response to treatment, examination of patient, obtaining history from patient or surrogate, ordering and performing treatments and interventions, ordering and review of laboratory studies, ordering and review of radiographic studies, pulse oximetry and re-evaluation of patient's condition.  ____________________________________________   INITIAL IMPRESSION / ASSESSMENT AND PLAN / ED COURSE  Pertinent labs & imaging results that were available during my care of the patient were reviewed by me and considered in my medical decision making (see chart for details).   Patient presents with known Covid, worsening of symptoms cough now very tachycardic appears dry on examination and likely severely dehydrated based on my clinical evaluation.  Awaiting laboratory testing, chest x-ray there is also concern is she had a recent CT angiogram reviewed upon that there was also effusion noted.  Question if this could also be worsening.  She appears very ill, likely critically ill but compensating for it given her age.  Sepsis panel initiated.  Also concerned that there could be some slight purulence and drainage from the anterior right surgical chest wound, very minimal a question of superinfection could exist for which we will culture.       ____________________________________________   FINAL CLINICAL IMPRESSION(S) / ED DIAGNOSES  Final diagnoses:  [id number] virus infection  Chest wall deformity, acquired  Sepsis, severe -source felt to be related to COVID-19 infection given the patient's medical history antibiotic coverage also initiated while cultures and further work-up are pending      Note:  This document was prepared using Dragon voice recognition software and may include unintentional dictation errors       Delman Kitten, MD 03/01/2019 1533

## 2019-02-16 NOTE — ED Notes (Addendum)
Date and time results received: 02/07/2019 1300  Test: Lactic Acid Critical Value: greater than 11.0  Name of Provider Notified: Dr. Jacqualine Code and Dr. Cruzita Lederer

## 2019-02-16 NOTE — ED Notes (Signed)
Date and time results received: 03/02/2019 1540  Test: Lactic Acid Critical Value: greater than 11.0  Name of Provider Notified: Dr. Cruzita Lederer, MD

## 2019-02-16 NOTE — H&P (Addendum)
History and Physical    [name] [id number] DOB: Sep 23, 1985 DOA: 03/02/2019  I have briefly reviewed the patient's prior medical records in [place]  PCP: Bunnie Pion, FNP  Patient coming from: home  Chief Complaint: Shortness of breath, weakness  HPI: [name] is a 33 y.o. 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  Patient came to the hospital 6 days ago on 02/10/2019 with fatigue, weakness, and she was diagnosed with SARS-CoV-2.  She was approached to be hospitalized however per previous notes she did not have childcare and she wanted to go home, and plans were in place for her to have arranged to receive outpatient infusion for remdesivir, however the nursing coordinator was unable to reach her as per chart and notes.  During that ED visit she underwent a CT angiogram which was negative for PE but did show diffuse groundglass infiltrates.  She has been managing her symptoms at home, however in the last 24 hours she has been more short of breath and has been feeling extremely weak, poor p.o. intake, and is unable to manage her symptoms at home.  She denies any abdominal pain, no nausea or vomiting, but does have poor p.o. intake.  She complains of lightheadedness when she stands up.  ED Course: In the emergency room she is afebrile, normotensive, and she is tachycardic with rates between 130 and 170s.  She is requiring 2 L nasal cannula.  Of note, during her previous ED visit her heart rate was 130.  CMP is pending but CBC shows a white count of 18.4, hemoglobin of 9.8 and platelets of 121.  Chest x-ray showed bilateral infiltrates.  Review of Systems: All systems reviewed, and apart from HPI, all negative  Past Medical History:  Diagnosis Date  . Anemia   . Breast  cancer [facility])     Past Surgical History:  Procedure Laterality Date  . BREAST BIOPSY Right 10/25/2018   Heart Clip, Pending path  . BREAST BIOPSY Right 10/25/2018   Lymph node biopsy, Hydromarker (butterfly), path pending  . CESAREAN SECTION    . CESAREAN SECTION N/A 04/24/2016   Procedure: REPEAT CESAREAN SECTION WITH BILATERAL TUBALIGATION;  Surgeon: Brayton Mars, MD;  Location: [facility] ORS;  Service: Obstetrics;  Laterality: N/A;  Baby Boy born @ 45 Apgars:8/9 Weight: 8lb 9oz  . PORTACATH PLACEMENT Left 11/05/2018   Procedure: INSERTION PORT-A-CATH;  Surgeon: Jules Husbands, MD;  Location: [facility] ORS;  Service: General;  Laterality: Left;  . PRIMARY CLOSURE Right 01/23/2019   Procedure: CLOSURE OF RIGHT BREAST;  Surgeon: Wallace Going, DO;  Location: [facility] ORS;  Service: Plastics;  Laterality: Right;  . SIMPLE MASTECTOMY WITH AXILLARY SENTINEL NODE BIOPSY Right 01/23/2019   Procedure: SIMPLE MASTECTOMY;  Surgeon: Jules Husbands, MD;  Location: [facility] ORS;  Service: General;  Laterality: Right;  combined surgery with Dr Baltazar Apo     reports that she has been smoking cigarettes. She has a 1.25 pack-year smoking history. She has never used smokeless tobacco. She reports that she does not drink alcohol or use drugs.  No Known Allergies  Family History  Problem Relation Age of Onset  . Hypertension Mother   . Stroke Father   . Cancer Maternal Grandmother   . Diabetes Maternal Grandfather   . Heart disease Maternal Grandfather   .  Hypertension Maternal Grandfather     Prior to Admission medications   Medication Sig Start Date End Date Taking? Authorizing Provider  dexamethasone (DECADRON) 4 MG tablet Take 1 tablets by mouth twice daily 02/11/19   Borders, Kirt Boys, NP  docusate sodium (COLACE) 100 MG capsule Take 1 capsule (100 mg total) by mouth 2 (two) times daily. 01/29/19   Fritzi Mandes, MD  gabapentin (NEURONTIN) 300 MG capsule Take 1 capsule (300 mg total) by mouth 3  (three) times daily. 11/04/18   Pabon, Diego F, MD  HYDROcodone-acetaminophen (NORCO) 5-325 MG tablet Take 1 tablet by mouth every 4 (four) hours as needed for moderate pain. 02/11/19   Borders, Kirt Boys, NP  lidocaine-prilocaine (EMLA) cream Apply 1 application topically as directed. Apply small amount over port site 1 hour before treatments, cover the cream with saran wrap to protect your clothing 01/13/19   Sindy Guadeloupe, MD  naloxegol oxalate (MOVANTIK) 25 MG TABS tablet Take 1 tablet (25 mg total) by mouth daily. 01/28/19   Sindy Guadeloupe, MD  OLANZapine (ZYPREXA) 10 MG tablet Take 1 tablet (10 mg total) by mouth at bedtime. Take a tablet day 2 through day 5 after each chemo treatment 12/03/18   Sindy Guadeloupe, MD  ondansetron (ZOFRAN) 8 MG tablet Take 1 tablet (8 mg total) by mouth 2 (two) times daily as needed. Start on the third day after chemotherapy. 11/07/18   Sindy Guadeloupe, MD  oxyCODONE (OXYCONTIN) 10 mg 12 hr tablet Take 1 tablet (10 mg total) by mouth every 12 (twelve) hours. 01/29/19   Verlon Au, NP  oxyCODONE (OXYCONTIN) 10 mg 12 hr tablet Take 1 tablet (10 mg total) by mouth every 12 (twelve) hours for 15 days. 02/10/19 02/25/19  Blake Divine, MD  Oxycodone HCl 10 MG TABS Take 1 tablet (10 mg total) by mouth every 4 (four) hours as needed for up to 20 days. 01/29/19 02/18/19  Verlon Au, NP  polyethylene glycol (MIRALAX / GLYCOLAX) 17 g packet Take 17 g by mouth daily. 01/29/19   Fritzi Mandes, MD  prochlorperazine (COMPAZINE) 10 MG tablet Take 1 tablet (10 mg total) by mouth every 6 (six) hours as needed (Nausea or vomiting). 11/07/18   Sindy Guadeloupe, MD   Physical Exam: Vitals:   02/18/2019 1100 02/06/2019 1115 02/05/2019 1130 02/18/2019 1145  BP: (!) 152/97 (!) 131/97 (!) 144/94 (!) 146/96  Pulse: (!) 137 (!) 139 (!) 145 (!) 145  Resp: (!) 32 (!) 24 (!) 30 15  Temp:      TempSrc:      SpO2: 100% 100% 100% 99%  Weight:       Constitutional: NAD, calm, comfortable Eyes:  PERRL, lids and conjunctivae normal ENMT: Mucous membranes are moist.  Neck: normal, supple Respiratory: Diffuse bilateral rhonchi, no wheezing.  Increased respiratory effort, tachypneic Cardiovascular: Regular rate and rhythm, no murmurs / rubs / gallops. No extremity edema. 2+ pedal pulses.  Abdomen: no tenderness, no masses palpated.  Musculoskeletal: no clubbing / cyanosis. Normal muscle tone.  Skin: no rashes, lesions, ulcers.  Surgical scar right breast healing, no surrounding cellulitis.  Drain in place with serous drainage Neurologic: Nonfocal, equal strength.  Generalized weakness present Psychiatric: Normal judgment and insight. Alert and oriented x 3. Normal mood.   Labs on Admission: I have personally reviewed following labs and imaging studies  CBC: Recent Labs  Lab 02/10/19 0800 02/05/2019 1004  WBC 13.2* 18.4*  NEUTROABS  --  11.8*  HGB 9.1* 9.8*  HCT 28.8* 32.2*  MCV 79.6* 84.5  PLT 433* [id number]*   Basic Metabolic Panel: Recent Labs  Lab 02/10/19 0800  NA 136  K 3.1*  CL 97*  CO2 19*  GLUCOSE 75  BUN 8  CREATININE 0.52  CALCIUM 9.0   Liver Function Tests: Recent Labs  Lab 02/10/19 0800  AST 260*  ALT 132*  ALKPHOS 653*  BILITOT 0.8  PROT 6.9  ALBUMIN 2.7*   Coagulation Profile: Recent Labs  Lab 02/11/2019 1004  INR 1.4*   BNP (last 3 results) No results for input(s): PROBNP in the last 8760 hours. CBG: No results for input(s): GLUCAP in the last 168 hours. Thyroid Function Tests: No results for input(s): TSH, T4TOTAL, FREET4, T3FREE, THYROIDAB in the last 72 hours. Urine analysis:    Component Value Date/Time   COLORURINE YELLOW (A) 02/10/2019 1130   APPEARANCEUR HAZY (A) 02/10/2019 1130   LABSPEC 1.025 02/10/2019 1130   PHURINE 5.0 02/10/2019 1130   GLUCOSEU NEGATIVE 02/10/2019 1130   HGBUR NEGATIVE 02/10/2019 1130   BILIRUBINUR NEGATIVE 02/10/2019 1130   KETONESUR 5 (A) 02/10/2019 1130   PROTEINUR 100 (A) 02/10/2019 1130   NITRITE  NEGATIVE 02/10/2019 1130   LEUKOCYTESUR NEGATIVE 02/10/2019 1130     Radiological Exams on Admission: DG Chest Port 1 View  Result Date: 02/04/2019 CLINICAL DATA:  Shortness of breath. COVID-19 positive. EXAM: PORTABLE CHEST 1 VIEW COMPARISON:  February 10, 2019. FINDINGS: The heart size and mediastinal contours are within normal limits. Left subclavian Port-A-Cath is unchanged in position. No pneumothorax pleural effusion is noted. Stable bilateral diffuse interstitial densities are noted. The visualized skeletal structures are unremarkable. IMPRESSION: Stable bilateral diffuse interstitial densities are noted concerning for edema or inflammation. Electronically Signed   By: Marijo Conception M.D.   On: 02/18/2019 10:56    EKG: Independently reviewed.  Sinus tachycardia  Assessment/Plan  Principal Problem Acute hypoxic respiratory failure due to COVID-19 multifocal pneumonia -Patient on 2 L, admitted to the hospital to stepdown given tachypnea as well as significant tachycardia, placed on remdesivir along with steroids -She is not a candidate for Actemra should she need it in the future -Check CRP, D-dimer, continue to monitor inflammatory markers -Had a CT angiogram chest a few days ago which was negative for PE  Active Problems Leukocytosis -White count is 18, I cannot tell whether she received Granix or Neulasta as an outpatient.  She did receive Decadron which helped a leukocytosis however it is difficult to differentiate between concurrent bacterial process given immunosuppression -Drain and surgical site do not look infected and drainage is serous -Placed on cefepime, blood cultures and culture from the drain have been sent  Stage IV metastatic breast cancer currently undergoing chemotherapy -For now chemotherapy will probably be on hold -Resume home pain medications for her chronic pain, use as needed's only and avoid scheduled due to respiratory status  Sinus  tachycardia -Probably in the setting of #1, component of dehydration also possibly due to underlying malignancy -Check a TSH for completeness -Bolused in the ED, continue IV fluids  DVT prophylaxis: Lovenox Code Status: Full code per patient Family Communication: No family at bedside Disposition Plan: Home when ready Bed Type: Stepdown Consults called: None Obs/Inp: Inpatient  Marzetta Board, MD, PhD [facility]  Contact via [email or web address]  02/28/2019, 11:52 AM

## 2019-02-17 ENCOUNTER — Other Ambulatory Visit: Payer: Medicaid Other

## 2019-02-17 ENCOUNTER — Inpatient Hospital Stay: Payer: Medicaid Other | Admitting: Oncology

## 2019-02-17 ENCOUNTER — Inpatient Hospital Stay: Payer: Medicaid Other

## 2019-02-17 ENCOUNTER — Ambulatory Visit: Payer: Medicaid Other

## 2019-02-17 ENCOUNTER — Other Ambulatory Visit: Payer: Self-pay

## 2019-02-17 DIAGNOSIS — Z7189 Other specified counseling: Secondary | ICD-10-CM

## 2019-02-17 DIAGNOSIS — Z515 Encounter for palliative care: Secondary | ICD-10-CM

## 2019-02-17 DIAGNOSIS — J9600 Acute respiratory failure, unspecified whether with hypoxia or hypercapnia: Secondary | ICD-10-CM

## 2019-02-17 DIAGNOSIS — C50919 Malignant neoplasm of unspecified site of unspecified female breast: Secondary | ICD-10-CM

## 2019-02-17 DIAGNOSIS — U071 COVID-19: Secondary | ICD-10-CM

## 2019-02-17 DIAGNOSIS — J9601 Acute respiratory failure with hypoxia: Secondary | ICD-10-CM

## 2019-02-17 DIAGNOSIS — J1289 Other viral pneumonia: Secondary | ICD-10-CM

## 2019-02-17 LAB — COMPREHENSIVE METABOLIC PANEL
ALT: 160 U/L — ABNORMAL HIGH (ref 0–44)
AST: 265 U/L — ABNORMAL HIGH (ref 15–41)
Albumin: 2 g/dL — ABNORMAL LOW (ref 3.5–5.0)
Alkaline Phosphatase: 1222 U/L — ABNORMAL HIGH (ref 38–126)
Anion gap: 22 — ABNORMAL HIGH (ref 5–15)
BUN: 14 mg/dL (ref 6–20)
CO2: 9 mmol/L — ABNORMAL LOW (ref 22–32)
Calcium: 8.8 mg/dL — ABNORMAL LOW (ref 8.9–10.3)
Chloride: 104 mmol/L (ref 98–111)
Creatinine, Ser: 0.46 mg/dL (ref 0.44–1.00)
GFR calc Af Amer: >60 mL/min (ref >60)
GFR calc non Af Amer: >60 mL/min (ref >60)
Glucose, Bld: 82 mg/dL (ref 70–99)
Potassium: 4.8 mmol/L (ref 3.5–5.1)
Sodium: 135 mmol/L (ref 135–145)
Total Bilirubin: 0.8 mg/dL (ref 0.3–1.2)
Total Protein: 5.5 g/dL — ABNORMAL LOW (ref 6.5–8.1)

## 2019-02-17 LAB — BLOOD GAS, ARTERIAL
Acid-base deficit: 18.2 mmol/L — ABNORMAL HIGH (ref 0.0–2.0)
Acid-base deficit: 21.3 mmol/L — ABNORMAL HIGH (ref 0.0–2.0)
Acid-base deficit: 16.7 mmol/L — ABNORMAL HIGH (ref 0.0–2.0)
Allens test (pass/fail): POSITIVE — AB
Bicarbonate: 5.6 mmol/L — ABNORMAL LOW (ref 20.0–28.0)
Bicarbonate: 8 mmol/L — ABNORMAL LOW (ref 20.0–28.0)
Bicarbonate: 10.7 mmol/L — ABNORMAL LOW (ref 20.0–28.0)
FIO2: 0.28
FIO2: 0.5
FIO2: 0.35
MECHVT: 450 mL
MECHVT: 450 mL
O2 Saturation: 98.3 %
O2 Saturation: 99 %
O2 Saturation: 98 %
PEEP: 5 cmH2O
PEEP: 5 cmH2O
Patient temperature: 37
Patient temperature: 37
Patient temperature: 37
RATE: 14 resp/min
RATE: 14 resp/min
pCO2 arterial: <19 mmHg — CL (ref 32.0–48.0)
pCO2 arterial: 29 mmHg — ABNORMAL LOW (ref 32.0–48.0)
pCO2 arterial: 30 mmHg — ABNORMAL LOW (ref 32.0–48.0)
pH, Arterial: 7.28 — ABNORMAL LOW (ref 7.350–7.450)
pH, Arterial: 7.05 — CL (ref 7.350–7.450)
pH, Arterial: 7.16 — CL (ref 7.350–7.450)
pO2, Arterial: 122 mmHg — ABNORMAL HIGH (ref 83.0–108.0)
pO2, Arterial: 177 mmHg — ABNORMAL HIGH (ref 83.0–108.0)
pO2, Arterial: 130 mmHg — ABNORMAL HIGH (ref 83.0–108.0)

## 2019-02-17 LAB — C-REACTIVE PROTEIN: CRP: 18.1 mg/dL — ABNORMAL HIGH (ref <1.0)

## 2019-02-17 LAB — CBC WITH DIFFERENTIAL/PLATELET
Abs Immature Granulocytes: 2.06 10*3/uL — ABNORMAL HIGH (ref 0.00–0.07)
Basophils Absolute: 0 10*3/uL (ref 0.0–0.1)
Basophils Relative: 0 %
Eosinophils Absolute: 0 10*3/uL (ref 0.0–0.5)
Eosinophils Relative: 0 %
HCT: 26.6 % — ABNORMAL LOW (ref 36.0–46.0)
Hemoglobin: 8.2 g/dL — ABNORMAL LOW (ref 12.0–15.0)
Immature Granulocytes: 14 %
Lymphocytes Relative: 12 %
Lymphs Abs: 1.8 10*3/uL (ref 0.7–4.0)
MCH: 25.6 pg — ABNORMAL LOW (ref 26.0–34.0)
MCHC: 30.8 g/dL (ref 30.0–36.0)
MCV: 83.1 fL (ref 80.0–100.0)
Monocytes Absolute: 0.9 10*3/uL (ref 0.1–1.0)
Monocytes Relative: 6 %
Neutro Abs: 9.8 10*3/uL — ABNORMAL HIGH (ref 1.7–7.7)
Neutrophils Relative %: 68 %
Platelets: 82 10*3/uL — ABNORMAL LOW (ref 150–400)
RBC: 3.2 MIL/uL — ABNORMAL LOW (ref 3.87–5.11)
RDW: 19.8 % — ABNORMAL HIGH (ref 11.5–15.5)
Smear Review: NORMAL
WBC: 14.6 10*3/uL — ABNORMAL HIGH (ref 4.0–10.5)
nRBC: 23.9 % — ABNORMAL HIGH (ref 0.0–0.2)

## 2019-02-17 LAB — LACTIC ACID, PLASMA
Lactic Acid, Venous: >11 mmol/L (ref 0.5–1.9)
Lactic Acid, Venous: >11 mmol/L (ref 0.5–1.9)

## 2019-02-17 LAB — BASIC METABOLIC PANEL
Anion gap: 23 — ABNORMAL HIGH (ref 5–15)
BUN: 20 mg/dL (ref 6–20)
CO2: 11 mmol/L — ABNORMAL LOW (ref 22–32)
Calcium: 8.8 mg/dL — ABNORMAL LOW (ref 8.9–10.3)
Chloride: 104 mmol/L (ref 98–111)
Creatinine, Ser: 0.65 mg/dL (ref 0.44–1.00)
GFR calc Af Amer: >60 mL/min (ref >60)
GFR calc non Af Amer: >60 mL/min (ref >60)
Glucose, Bld: 176 mg/dL — ABNORMAL HIGH (ref 70–99)
Potassium: 4.7 mmol/L (ref 3.5–5.1)
Sodium: 138 mmol/L (ref 135–145)

## 2019-02-17 LAB — TROPONIN I (HIGH SENSITIVITY)
Troponin I (High Sensitivity): 37 ng/L — ABNORMAL HIGH (ref <18)
Troponin I (High Sensitivity): 38 ng/L — ABNORMAL HIGH (ref <18)

## 2019-02-17 LAB — PHOSPHORUS
Phosphorus: 1.8 mg/dL — ABNORMAL LOW (ref 2.5–4.6)
Phosphorus: 3.8 mg/dL (ref 2.5–4.6)

## 2019-02-17 LAB — MRSA PCR SCREENING: MRSA by PCR: NEGATIVE

## 2019-02-17 LAB — PROCALCITONIN: Procalcitonin: 6.27 ng/mL

## 2019-02-17 LAB — FIBRIN DERIVATIVES D-DIMER (ARMC ONLY): Fibrin derivatives D-dimer (ARMC): >7500 ng/mL (FEU) — ABNORMAL HIGH (ref 0.00–499.00)

## 2019-02-17 LAB — MAGNESIUM: Magnesium: 1.9 mg/dL (ref 1.7–2.4)

## 2019-02-17 LAB — TSH: TSH: 2.138 u[IU]/mL (ref 0.350–4.500)

## 2019-02-17 MED ORDER — FENTANYL 2500MCG IN NS 250ML (10MCG/ML) PREMIX INFUSION
0.0000 ug/h | INTRAVENOUS | Status: DC
  Administered 2019-02-17: 50 ug/h via INTRAVENOUS
  Administered 2019-02-18 – 2019-02-19 (×3): 200 ug/h via INTRAVENOUS
  Filled 2019-02-17 (×5): qty 250

## 2019-02-17 MED ORDER — NOREPINEPHRINE 4 MG/250ML-% IV SOLN
0.0000 ug/min | INTRAVENOUS | Status: DC
  Filled 2019-02-17: qty 250

## 2019-02-17 MED ORDER — METOPROLOL TARTRATE 5 MG/5ML IV SOLN
2.5000 mg | Freq: Four times a day (QID) | INTRAVENOUS | Status: DC | PRN
  Administered 2019-02-18: 2.5 mg via INTRAVENOUS
  Filled 2019-02-17: qty 5

## 2019-02-17 MED ORDER — METOPROLOL TARTRATE 5 MG/5ML IV SOLN
INTRAVENOUS | Status: AC
  Administered 2019-02-17: 2.5 mg via INTRAVENOUS
  Filled 2019-02-17: qty 5

## 2019-02-17 MED ORDER — SODIUM BICARBONATE 8.4 % IV SOLN
100.0000 meq | Freq: Once | INTRAVENOUS | Status: AC
  Administered 2019-02-17: 100 meq via INTRAVENOUS
  Filled 2019-02-17: qty 50

## 2019-02-17 MED ORDER — CHLORHEXIDINE GLUCONATE CLOTH 2 % EX PADS
6.0000 | MEDICATED_PAD | Freq: Every day | CUTANEOUS | Status: DC
  Administered 2019-02-18 – 2019-02-19 (×2): 6 via TOPICAL

## 2019-02-17 MED ORDER — LACTATED RINGERS IV BOLUS
1000.0000 mL | Freq: Once | INTRAVENOUS | Status: AC
  Administered 2019-02-17: 1000 mL via INTRAVENOUS

## 2019-02-17 MED ORDER — MIDAZOLAM HCL 2 MG/2ML IJ SOLN
INTRAMUSCULAR | Status: AC
  Administered 2019-02-17: 18:00:00 2 mg via INTRAVENOUS
  Filled 2019-02-17: qty 2

## 2019-02-17 MED ORDER — VANCOMYCIN HCL IN DEXTROSE 1-5 GM/200ML-% IV SOLN
1000.0000 mg | Freq: Once | INTRAVENOUS | Status: AC
  Administered 2019-02-17: 1000 mg via INTRAVENOUS
  Filled 2019-02-17: qty 200

## 2019-02-17 MED ORDER — MELATONIN 5 MG PO TABS
5.0000 mg | ORAL_TABLET | ORAL | Status: DC
  Filled 2019-02-17 (×3): qty 1

## 2019-02-17 MED ORDER — ASCORBIC ACID 500 MG PO TABS
500.0000 mg | ORAL_TABLET | Freq: Two times a day (BID) | ORAL | Status: DC
  Administered 2019-02-17 – 2019-02-19 (×3): 500 mg
  Filled 2019-02-17 (×4): qty 1

## 2019-02-17 MED ORDER — VANCOMYCIN HCL IN DEXTROSE 750-5 MG/150ML-% IV SOLN
750.0000 mg | Freq: Two times a day (BID) | INTRAVENOUS | Status: DC
  Administered 2019-02-17 – 2019-02-19 (×4): 750 mg via INTRAVENOUS
  Filled 2019-02-17 (×5): qty 150

## 2019-02-17 MED ORDER — MIDAZOLAM HCL 2 MG/2ML IJ SOLN: 2.0000 mg | Freq: Once | INTRAMUSCULAR | Status: DC

## 2019-02-17 MED ORDER — CHLORHEXIDINE GLUCONATE 0.12% ORAL RINSE (MEDLINE KIT)
15.0000 mL | Freq: Two times a day (BID) | OROMUCOSAL | Status: DC
  Administered 2019-02-17 – 2019-02-19 (×4): 15 mL via OROMUCOSAL

## 2019-02-17 MED ORDER — SODIUM CHLORIDE 0.9 % IV BOLUS
1000.0000 mL | Freq: Once | INTRAVENOUS | Status: AC
  Administered 2019-02-17: 1000 mL via INTRAVENOUS

## 2019-02-17 MED ORDER — LACTATED RINGERS IV BOLUS
2000.0000 mL | Freq: Once | INTRAVENOUS | Status: AC
  Administered 2019-02-17: 2000 mL via INTRAVENOUS

## 2019-02-17 MED ORDER — ZINC SULFATE 220 (50 ZN) MG PO CAPS
220.0000 mg | ORAL_CAPSULE | Freq: Every day | ORAL | Status: DC
  Administered 2019-02-18 – 2019-02-19 (×2): 220 mg
  Filled 2019-02-17 (×2): qty 1

## 2019-02-17 MED ORDER — MIDAZOLAM HCL 2 MG/2ML IJ SOLN
2.0000 mg | INTRAMUSCULAR | Status: DC | PRN
  Administered 2019-02-17 – 2019-02-18 (×3): 2 mg via INTRAVENOUS
  Filled 2019-02-17 (×4): qty 2

## 2019-02-17 MED ORDER — IPRATROPIUM-ALBUTEROL 20-100 MCG/ACT IN AERS
1.0000 | INHALATION_SPRAY | Freq: Four times a day (QID) | RESPIRATORY_TRACT | Status: DC
  Administered 2019-02-17: 1 via RESPIRATORY_TRACT
  Filled 2019-02-17: qty 4

## 2019-02-17 MED ORDER — ORAL CARE MOUTH RINSE
15.0000 mL | OROMUCOSAL | Status: DC
  Administered 2019-02-17 – 2019-02-19 (×20): 15 mL via OROMUCOSAL

## 2019-02-17 MED ORDER — POLYETHYLENE GLYCOL 3350 17 G PO PACK
17.0000 g | PACK | Freq: Every day | ORAL | Status: DC
  Administered 2019-02-18 – 2019-02-19 (×2): 17 g
  Filled 2019-02-17 (×2): qty 1

## 2019-02-17 MED ORDER — SODIUM BICARBONATE 8.4 % IV SOLN
INTRAVENOUS | Status: DC
  Administered 2019-02-17 (×2): via INTRAVENOUS
  Filled 2019-02-17 (×3): qty 150

## 2019-02-17 MED ORDER — SODIUM CHLORIDE 0.9 % IV BOLUS: 1000.0000 mL | Freq: Once | INTRAVENOUS | Status: DC

## 2019-02-17 MED ORDER — PROPOFOL 1000 MG/100ML IV EMUL
5.0000 ug/kg/min | INTRAVENOUS | Status: DC
  Filled 2019-02-17: qty 100

## 2019-02-17 MED ORDER — ENOXAPARIN SODIUM 40 MG/0.4ML ~~LOC~~ SOLN
40.0000 mg | Freq: Two times a day (BID) | SUBCUTANEOUS | Status: DC
  Administered 2019-02-17 – 2019-02-18 (×2): 40 mg via SUBCUTANEOUS
  Filled 2019-02-17 (×2): qty 0.4

## 2019-02-17 NOTE — ED Notes (Addendum)
Fentanyl 100 mcg administered by [employer]

## 2019-02-17 NOTE — ED Notes (Signed)
Preparing for intubation. Mitch RN, Val RN, Dr Mortimer Fries and Donnelly Angelica RT at bedside.

## 2019-02-17 NOTE — ED Notes (Signed)
Message to admitting:  i'm concerned that this pt isn't getting any better. when awake HR 150's and when asleep 130's. RR 30's. Dr V ordered a 4th liter NS. I'll repeat a lactic after that. please advise

## 2019-02-17 NOTE — ED Notes (Signed)
Time out performed prior to procedure.

## 2019-02-17 NOTE — Consult Note (Addendum)
Hematology/Oncology Consult note [facility] [phone] [phone]  Patient Care Team: Bunnie Pion, FNP as PCP - General (Family Medicine) Rico Junker, RN as Registered Nurse Theodore Demark, RN as Registered Nurse   Name of the patient: [name]  [id number]  01-May-1985    Reason for consult: Metastatic triple negative breast cancer now with COVID-19 infection and acute respiratory failure   Requesting physician: Dr. 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  She underwent 2 cycles of AC chemotherapy, last cycle was given on 12/03/2018 and since then patient has not received any chemotherapy.  Patient wanted to get her breast wound taken care of first before continuing chemotherapy.  She underwent mastectomy with reconstruction on 01/23/2019.  Patient was supposed to receive chemotherapy prior to that but did not show up for her appointments.  In late November patient was admitted to the ER with symptoms of worsening back pain and was noted to have progression of disease withModerate right pleural effusion, bulky mediastinal hilar and axillary adenopathy, liver lesions and scattered subpleural nodules.  She was discharged from the hospital on pain control for her back pain and was supposed to restart chemotherapy as an outpatient today.  However patient then came down with worsening respiratory distress and was diagnosed with Covid about 10 days ago.  She did not wish to get hospitalized.  She was started on outpatient Decadron and we are planning to get her into outpatient clinic for bamlanavamib infusion.  However patient's respiratory status got acutely worse and patient came to the ER with increasing respiratory distress and is currently intubated secondary to her acute hypoxic  respiratory failure from Covid pneumonia  ECOG PS- 4     Review of systems- Review of Systems  Unable to perform ROS: Acuity of condition    No Known Allergies  Patient Active Problem List   Diagnosis Date Noted  . Acute respiratory failure ([place])   . COVID-19 virus infection 02/11/2019  . Constipation   . Intractable back pain 01/28/2019  . Cancer associated pain   . Metastatic breast cancer ([place])   . Palliative care encounter   . Goals of care, counseling/discussion 11/08/2018  . Breast cancer ([place]) 11/07/2018  . Iron deficiency anemia 11/05/2018  . Anemia 04/28/2016  . Status post tubal ligation 04/28/2016  . Status post repeat low transverse cesarean section 04/24/2016  . Back pain 01/17/2016     Past Medical History:  Diagnosis Date  . Anemia   . Breast cancer [facility])      Past Surgical History:  Procedure Laterality Date  . BREAST BIOPSY Right 10/25/2018   Heart Clip, Pending path  . BREAST BIOPSY Right 10/25/2018   Lymph node biopsy, Hydromarker (butterfly), path pending  . CESAREAN SECTION    . CESAREAN SECTION N/A 04/24/2016   Procedure: REPEAT CESAREAN SECTION WITH BILATERAL TUBALIGATION;  Surgeon: Brayton Mars, MD;  Location: [facility] ORS;  Service: Obstetrics;  Laterality: N/A;  Baby Boy born @ 76 Apgars:8/9 Weight: 8lb 9oz  . PORTACATH PLACEMENT Left 11/05/2018   Procedure: INSERTION PORT-A-CATH;  Surgeon: Jules Husbands, MD;  Location: [facility] ORS;  Service: General;  Laterality: Left;  . PRIMARY CLOSURE Right 01/23/2019   Procedure: CLOSURE OF RIGHT BREAST;  Surgeon: Wallace Going, DO;  Location: [facility] ORS;  Service: Plastics;  Laterality: Right;  . SIMPLE MASTECTOMY WITH AXILLARY SENTINEL NODE BIOPSY Right 01/23/2019   Procedure: SIMPLE MASTECTOMY;  Surgeon: Jules Husbands, MD;  Location: [facility] ORS;  Service: General;  Laterality: Right;  combined surgery with Dr Baltazar Apo    Social History   Socioeconomic History  . Marital status:  Single    Spouse name: Not on file  . Number of children: Not on file  . Years of education: Not on file  . Highest education level: Not on file  Occupational History  . Not on file  Tobacco Use  . Smoking status: Current Every Day Smoker    Packs/day: 0.25    Years: 5.00    Pack years: 1.25    Types: Cigarettes  . Smokeless tobacco: Never Used  Substance and Sexual Activity  . Alcohol use: No  . Drug use: No  . Sexual activity: Yes  Other Topics Concern  . Not on file  Social History Narrative  . Not on file   Social Determinants of Health   Financial Resource Strain:   . Difficulty of Paying Living Expenses: Not on file  Food Insecurity:   . Worried About Charity fundraiser in the Last Year: Not on file  . Ran Out of Food in the Last Year: Not on file  Transportation Needs:   . Lack of Transportation (Medical): Not on file  . Lack of Transportation (Non-Medical): Not on file  Physical Activity:   . Days of Exercise per Week: Not on file  . Minutes of Exercise per Session: Not on file  Stress:   . Feeling of Stress : Not on file  Social Connections:   . Frequency of Communication with Friends and Family: Not on file  . Frequency of Social Gatherings with Friends and Family: Not on file  . Attends Religious Services: Not on file  . Active Member of Clubs or Organizations: Not on file  . Attends Archivist Meetings: Not on file  . Marital Status: Not on file  Intimate Partner Violence:   . Fear of Current or Ex-Partner: Not on file  . Emotionally Abused: Not on file  . Physically Abused: Not on file  . Sexually Abused: Not on file     Family History  Problem Relation Age of Onset  . Hypertension Mother   . Stroke Father   . Cancer Maternal Grandmother   . Diabetes Maternal Grandfather   . Heart disease Maternal Grandfather   . Hypertension Maternal Grandfather      Current Facility-Administered Medications:  .  acetaminophen (TYLENOL) tablet  650 mg, 650 mg, Oral, Q6H PRN, Caren Griffins, MD, 650 mg at 03/06/2019 2215 .  ascorbic acid (VITAMIN C) tablet 500 mg, 500 mg, Per Tube, BID, Kasa, Kurian, MD .  ceFEPIme (MAXIPIME) 2 g in sodium chloride 0.9 % 100 mL IVPB, 2 g, Intravenous, Q8H, Rocky Morel, RPH, Last Rate: 200 mL/hr at 02/17/19 1451, 2 g at 02/17/19 1451 .  enoxaparin (LOVENOX) injection 40 mg, 40 mg, Subcutaneous, Q12H, Kasa, Kurian, MD .  fentaNYL 2515mg in NS 2553m(1060mml) infusion-PREMIX, 0-400 mcg/hr, Intravenous, Continuous, Kasa, Kurian, MD, Last Rate: 2.5 mL/hr at 02/17/19 1242, 25 mcg/hr at 02/17/19 1242 .  Melatonin TABS 5 mg, 5 mg, Oral, Q24H, Kasa, Kurian, MD .  methylPREDNISolone sodium succinate (SOLU-MEDROL) 40 mg/mL injection 24 mg, 0.5 mg/kg, Intravenous, Q12H, Gherghe, Costin M, MD, 24 mg at 02/17/19 0942 .  naloxegol oxalate (MOVANTIK) tablet 25 mg, 25  mg, Oral, Daily, Gherghe, Costin M, MD .  norepinephrine (LEVOPHED) 13m in 2554mpremix infusion, 0-40 mcg/min, Intravenous, Titrated, KaFlora LippsMD .  [SDerrill MemoN 02/18/2019] polyethylene glycol (MIRALAX / GLYCOLAX) packet 17 g, 17 g, Per Tube, Daily, KaFlora LippsMD .  [CMargrett Rudemdesivir 200 mg in sodium chloride 0.9% 250 mL IVPB, 200 mg, Intravenous, Once, Stopped at 03/01/2019 1322 **FOLLOWED BY** remdesivir 100 mg in sodium chloride 0.9 % 100 mL IVPB, 100 mg, Intravenous, Daily, GhCaren GriffinsMD, Stopped at 02/17/19 1037 .  sodium bicarbonate 150 mEq in dextrose 5 % 1,000 mL infusion, , Intravenous, Continuous, Kasa, Kurian, MD, Last Rate: 100 mL/hr at 02/17/19 1447, Rate Change at 02/17/19 1447 .  [START ON 02/18/2019] zinc sulfate capsule 220 mg, 220 mg, Per Tube, Daily, KaFlora LippsMD   Physical exam:  Vitals:   02/17/19 1210 02/17/19 1215 02/17/19 1245 02/17/19 1300  BP:  123/78 108/74 103/73  Pulse:  (!) 163 (!) 160 (!) 159  Resp:  (!) 22 (!) 22 15  Temp:  97.7 F (36.5 C) 99.6 F (37.6 C) 99.8 F (37.7 C)  TempSrc:        SpO2: 100% 100% 100% 100%  Weight:       General: intubated and sedated.  Unresponsive Respiratory: Appears tachypneic.  On ventilator Cardiac: tachycardic    CMP Latest Ref Rng & Units 02/17/2019  Glucose 70 - 99 mg/dL 176(H)  BUN 6 - 20 mg/dL 20  Creatinine 0.44 - 1.00 mg/dL 0.65  Sodium 135 - 145 mmol/L 138  Potassium 3.5 - 5.1 mmol/L 4.7  Chloride 98 - 111 mmol/L 104  CO2 22 - 32 mmol/L 11(L)  Calcium 8.9 - 10.3 mg/dL 8.8(L)  Total Protein 6.5 - 8.1 g/dL -  Total Bilirubin 0.3 - 1.2 mg/dL -  Alkaline Phos 38 - 126 U/L -  AST 15 - 41 U/L -  ALT 0 - 44 U/L -   CBC Latest Ref Rng & Units 02/17/2019  WBC 4.0 - 10.5 K/uL 14.6(H)  Hemoglobin 12.0 - 15.0 g/dL 8.2(L)  Hematocrit 36.0 - 46.0 % 26.6(L)  Platelets 150 - 400 K/uL 82(L)    _0 @  DG Chest 2 View  Result Date: 01/27/2019 CLINICAL DATA:  Right-sided chest pain status post mastectomy EXAM: CHEST - 2 VIEW COMPARISON:  11/05/2018 FINDINGS: Left-sided central venous catheter with tip over the proximal right atrium. Drainage catheter over the right inframammary region. Small right greater than left pleural effusion. Mild airspace disease at the right base. Bilateral hilar fullness, possible nodes. Perihilar interstitial opacity. No pneumothorax. IMPRESSION: 1. Small bilateral pleural effusions, right greater than left with atelectasis or mild infiltrate at the right base. 2. Increased perihilar interstitial opacity which may reflect atypical infection, less likely edema, or possible interstitial spread of disease. 3. Bilateral hilar fullness, cannot exclude hilar nodes. Electronically Signed   By: KiDonavan Foil.D.   On: 01/27/2019 22:15   CT Angio Chest PE W/Cm &/Or Wo Cm  Result Date: 02/10/2019 CLINICAL DATA:  History of breast cancer. Shortness of breath for 2 days. Patient status post right mastectomy 01/23/2019 EXAM: CT ANGIOGRAPHY CHEST CT ABDOMEN AND PELVIS WITH CONTRAST TECHNIQUE: Multidetector CT imaging of  the chest was performed using the standard protocol during bolus administration of intravenous contrast. Multiplanar CT image reconstructions and MIPs were obtained to evaluate the vascular anatomy. Multidetector CT imaging of the abdomen and pelvis was performed using the standard protocol during bolus administration of intravenous contrast. CONTRAST:  75 mL OMNIPAQUE IOHEXOL 350 MG/ML SOLN COMPARISON:  CT angiogram of the chest 01/28/2019. CT abdomen and pelvis 01/18/2019. FINDINGS: CTA CHEST FINDINGS Cardiovascular: No pulmonary embolus is seen. Heart size is normal. No pericardial effusion. Mediastinum/Nodes: Mediastinal and hilar lymphadenopathy is again seen. 1.6 cm node in the right hilum on image 45 is identified. An AP window node on image 38 measures 1.1 cm short axis dimension. The appearance is unchanged. The esophagus is unremarkable. The thyroid gland appears normal. Lungs/Pleura: Moderately large right pleural effusion has slightly increased since the prior CT. Scattered areas of ground-glass attenuation throughout both lungs are new since the prior CT. Multiple pulmonary nodules are again identified. A left lower lobe nodule measuring 0.8 cm on image 67 measured 0.6 cm on the prior examination. A 0.6 cm left lower lobe nodule on the prior examination measures 0.7 cm on image 62, series 4 today. A 0.5 cm left upper lobe nodule on image 33 is new since the prior CT. Compressive atelectasis on the right is noted. Musculoskeletal: Soft tissue gas in the right chest wall from prior mastectomy has resolved since the prior study. There is soft tissue edema and stranding in the right chest wall likely due to postoperative change. A drain is in place. Multiple sclerotic lesions are seen in scattered bones are consistent with metastatic disease. Review of the MIP images confirms the above findings. CT ABDOMEN and PELVIS FINDINGS Hepatobiliary: Again seen are multiple metastatic deposits in the liver including  a 2.3 cm lesion in the posterior right hepatic lobe on image 18 which measured 2.1 cm on the prior exam. The gallbladder is mildly distended. Biliary tree is unremarkable. Pancreas: Unremarkable. No pancreatic ductal dilatation or surrounding inflammatory changes. Spleen: Normal in size without focal abnormality. Adrenals/Urinary Tract: Adrenal glands are unremarkable. Kidneys are normal, without renal calculi, focal lesion, or hydronephrosis. Bladder is unremarkable. Stomach/Bowel: Stomach is within normal limits. Appendix appears normal. No evidence of bowel wall thickening, distention, or inflammatory changes. Vascular/Lymphatic: No significant vascular findings are present. No enlarged abdominal or pelvic lymph nodes. Reproductive: Uterus and bilateral adnexa are unremarkable. Other: There is a small volume of free pelvic fluid which is new since the prior exam. Musculoskeletal: Multiple sclerotic bone lesions consistent with metastatic disease are again seen. Review of the MIP images confirms the above findings. IMPRESSION: Negative for pulmonary embolus. Scattered ground-glass attenuation in both lungs is worrisome for pneumonia, including atypical/viral infection. Moderately large right pleural effusion has increased since the prior chest CT. New pulmonary nodules and increase in the size previously seen nodules consistent with progressive metastatic disease. Additional metastatic disease with lymphadenopathy in the chest, liver lesions and skeletal lesions noted. Small volume of free pelvic fluid is new since the prior abdomen and pelvis CT scan and may be due to volume overload. Postoperative change right chest wall. Surgical drain remains in place. Electronically Signed   By: Inge Rise M.D.   On: 02/10/2019 12:27   CT Angio Chest PE W and/or Wo Contrast  Result Date: 01/28/2019 CLINICAL DATA:  Right upper back and rib pain, increasing drainage from mastectomy site, procedure performed  01/23/2019 EXAM: CT ANGIOGRAPHY CHEST WITH CONTRAST TECHNIQUE: Multidetector CT imaging of the chest was performed using the standard protocol during bolus administration of intravenous contrast. Multiplanar CT image reconstructions and MIPs were obtained to evaluate the vascular anatomy. CONTRAST:  44m OMNIPAQUE IOHEXOL 350 MG/ML SOLN COMPARISON:  PET CT November 04, 2018 FINDINGS: Cardiovascular: Satisfactory opacification the pulmonary arteries to  the segmental level. No pulmonary artery filling defects are identified. Central pulmonary arteries are normal caliber. No elevation of the RV/LV ratio (0.7). Left subclavian approach Port-A-Cath tip terminates at the cavoatrial junction. Normal cardiac size. Trace pericardial fluid. Thoracic aorta is normal caliber. Normal 3 vessel branching of the aortic arch. No luminal irregularity is seen. Mediastinum/Nodes: There is increasing bulky mediastinal, hilar and axillary adenopathy, much of which was previously FDG avid on comparison PET-CT. Reference lymph nodes include a right axillary lymph node measuring 18 mm, previously 14 mm (4/34). A 13 mm left axillary lymph node not clearly evident on comparison study (4/29). A 15 mm right hilar lymph node, measuring approximately 12 mm previously (4/44). And a 12 mm AP window lymph node, not clearly present on comparison (4/37). No acute abnormality of the trachea or esophagus. Lungs/Pleura: There is a moderate right pleural effusion and trace left effusion. No abnormal pleural thickening or enhancement adjacent areas of passive atelectasis are noted. Some interlobular septal thickening is present as well. There are several scattered subpleural nodules, largest measuring up to 6 mm in size (6/60). Upper Abdomen: Numerous hypoattenuating nodules throughout the liver, not clearly evident on comparison PET-CT. Musculoskeletal: There are postsurgical changes from recent right mastectomy and right axillary nodal dissection with  surgical clips. There is soft tissue gas and stranding adjacent the right pectoralis musculature with a a surgical soft tissue drain coursing from the right chest wall and terminating just to the left of midline. Axillary fluid in stranding is noted as well (4/49). There is some abnormal hypoattenuation in the right pectoralis musculature (4/56) nonspecific given recent postoperative state but an intramuscular hematoma, abscess or breast mass is not excluded. Review of the MIP images confirms the above findings. IMPRESSION: 1. No evidence of pulmonary embolism. 2. Postsurgical changes from recent right mastectomy and right axillary nodal dissection with soft tissue gas and stranding adjacent to the right pectoralis musculature with a surgical soft tissue drain coursing from the right chest wall and terminating just to the left of midline. There is some abnormal hypoattenuation in the medial right pectoralis musculature, nonspecific given recent postoperative state and proximity to the primary breast lesion, but an intramuscular hematoma, abscess or breast mass is not excluded. 3. Numerous hypoattenuating nodules throughout the liver, not clearly evident on comparison PET-CT, concerning for metastatic disease. 4. Increased bulky mediastinal, hilar and axillary adenopathy, much of which was previously FDG avid on comparison PET-CT, concerning for metastatic disease. 5. Few rounded subpleural nodule suspicious for further metastatic disease are present as well. 6. The moderate right and trace left pleural effusions. Malignant effusion is highly likely though no pleural thickening or enhancement is seen. These results were called by telephone at the time of interpretation on 01/28/2019 at 12:36 am to provider Dr Alfred Levins , who verbally acknowledged these results. Electronically Signed   By: Lovena Le M.D.   On: 01/28/2019 00:37   MR LUMBAR SPINE WO CONTRAST  Result Date: 01/28/2019 CLINICAL DATA:  Back pain,  greater than 6 weeks conservative treatment, persistent symptoms additional history provided: 33 year old female with known history of metastatic breast cancer status post chemotherapy. Acute onset intractable left upper back pain EXAM: MRI LUMBAR SPINE WITHOUT CONTRAST TECHNIQUE: Multiplanar, multisequence MR imaging of the lumbar spine was performed. No intravenous contrast was administered. COMPARISON:  CT abdomen/pelvis 01/18/2019. FINDINGS: Multiple sequences are mildly motion degraded. Segmentation:  5 lumbar vertebrae Alignment: Straightening of the expected lumbar lordosis. No significant spondylolisthesis. Vertebrae: There is diffuse  abnormal T1 hypointense marrow signal. 1.5 cm T2/STIR hyperintense lesion within the T12 vertebral body suspicious for a metastatic lesion. Multifocal sclerotic metastases within the lower thoracic and lumbar spine as well as bony pelvis were otherwise better appreciated on recent prior CT abdomen/pelvis 01/18/2019. Vertebral body height is maintained. Conus medullaris and cauda equina: Conus extends to the L1 level. No signal abnormality within the visualized distal spinal cord. Paraspinal and other soft tissues: Multiple liver metastases, partially imaged. No other abnormality identified within included portions of the abdomen/retroperitoneum. Paraspinal soft tissues within normal limits. Disc levels: Intervertebral disc height is preserved. There is multilevel mild to moderate facet hypertrophy. Facet hypertrophy results in mild left L4-L5 subarticular narrowing without nerve root impingement. No significant disc herniation, central canal stenosis or neural foraminal narrowing at any level. IMPRESSION: 1.5 cm T12 vertebral body lesion likely reflecting and osseous metastasis. Numerous additional sclerotic metastases within the lower thoracic and lumbar spine as well as bony pelvis were better appreciated on CT abdomen/pelvis 01/18/2019. No compression fracture. Mild lumbar  spondylosis predominantly consisting of facet arthropathy. Mild L4-L5 left subarticular narrowing without nerve root impingement. No significant central canal stenosis or neural foraminal narrowing at any level. Diffuse abnormal T1 hypointense marrow signal which is nonspecific, but likely reflects red marrow reconversion given provided history. Multiple partially imaged liver metastases. Electronically Signed   By: Kellie Simmering DO   On: 01/28/2019 14:40   CT Abdomen Pelvis W Contrast  Result Date: 02/10/2019 CLINICAL DATA:  History of breast cancer. Shortness of breath for 2 days. Patient status post right mastectomy 01/23/2019 EXAM: CT ANGIOGRAPHY CHEST CT ABDOMEN AND PELVIS WITH CONTRAST TECHNIQUE: Multidetector CT imaging of the chest was performed using the standard protocol during bolus administration of intravenous contrast. Multiplanar CT image reconstructions and MIPs were obtained to evaluate the vascular anatomy. Multidetector CT imaging of the abdomen and pelvis was performed using the standard protocol during bolus administration of intravenous contrast. CONTRAST:  75 mL OMNIPAQUE IOHEXOL 350 MG/ML SOLN COMPARISON:  CT angiogram of the chest 01/28/2019. CT abdomen and pelvis 01/18/2019. FINDINGS: CTA CHEST FINDINGS Cardiovascular: No pulmonary embolus is seen. Heart size is normal. No pericardial effusion. Mediastinum/Nodes: Mediastinal and hilar lymphadenopathy is again seen. 1.6 cm node in the right hilum on image 45 is identified. An AP window node on image 38 measures 1.1 cm short axis dimension. The appearance is unchanged. The esophagus is unremarkable. The thyroid gland appears normal. Lungs/Pleura: Moderately large right pleural effusion has slightly increased since the prior CT. Scattered areas of ground-glass attenuation throughout both lungs are new since the prior CT. Multiple pulmonary nodules are again identified. A left lower lobe nodule measuring 0.8 cm on image 67 measured 0.6 cm on  the prior examination. A 0.6 cm left lower lobe nodule on the prior examination measures 0.7 cm on image 62, series 4 today. A 0.5 cm left upper lobe nodule on image 33 is new since the prior CT. Compressive atelectasis on the right is noted. Musculoskeletal: Soft tissue gas in the right chest wall from prior mastectomy has resolved since the prior study. There is soft tissue edema and stranding in the right chest wall likely due to postoperative change. A drain is in place. Multiple sclerotic lesions are seen in scattered bones are consistent with metastatic disease. Review of the MIP images confirms the above findings. CT ABDOMEN and PELVIS FINDINGS Hepatobiliary: Again seen are multiple metastatic deposits in the liver including a 2.3 cm lesion in the posterior  right hepatic lobe on image 18 which measured 2.1 cm on the prior exam. The gallbladder is mildly distended. Biliary tree is unremarkable. Pancreas: Unremarkable. No pancreatic ductal dilatation or surrounding inflammatory changes. Spleen: Normal in size without focal abnormality. Adrenals/Urinary Tract: Adrenal glands are unremarkable. Kidneys are normal, without renal calculi, focal lesion, or hydronephrosis. Bladder is unremarkable. Stomach/Bowel: Stomach is within normal limits. Appendix appears normal. No evidence of bowel wall thickening, distention, or inflammatory changes. Vascular/Lymphatic: No significant vascular findings are present. No enlarged abdominal or pelvic lymph nodes. Reproductive: Uterus and bilateral adnexa are unremarkable. Other: There is a small volume of free pelvic fluid which is new since the prior exam. Musculoskeletal: Multiple sclerotic bone lesions consistent with metastatic disease are again seen. Review of the MIP images confirms the above findings. IMPRESSION: Negative for pulmonary embolus. Scattered ground-glass attenuation in both lungs is worrisome for pneumonia, including atypical/viral infection. Moderately large  right pleural effusion has increased since the prior chest CT. New pulmonary nodules and increase in the size previously seen nodules consistent with progressive metastatic disease. Additional metastatic disease with lymphadenopathy in the chest, liver lesions and skeletal lesions noted. Small volume of free pelvic fluid is new since the prior abdomen and pelvis CT scan and may be due to volume overload. Postoperative change right chest wall. Surgical drain remains in place. Electronically Signed   By: Inge Rise M.D.   On: 02/10/2019 12:27   CT ABDOMEN PELVIS W CONTRAST  Result Date: 01/18/2019 CLINICAL DATA:  Breast cancer.  Left lower back pain. EXAM: CT ABDOMEN AND PELVIS WITH CONTRAST TECHNIQUE: Multidetector CT imaging of the abdomen and pelvis was performed using the standard protocol following bolus administration of intravenous contrast. CONTRAST:  69m OMNIPAQUE IOHEXOL 300 MG/ML  SOLN COMPARISON:  None. FINDINGS: Lower chest: Small right pleural effusion. Lung bases clear. Heart is normal size. Hepatobiliary: Numerous low-density lesions throughout the liver compatible with metastases. Index lesion in the posterior right hepatic lobe measures 2.1 cm. Gallbladder unremarkable. Pancreas: No focal abnormality or ductal dilatation. Spleen: No focal abnormality.  Normal size. Adrenals/Urinary Tract: No adrenal abnormality. No focal renal abnormality. No stones or hydronephrosis. Urinary bladder is unremarkable. Stomach/Bowel: Large stool burden in the cecum and ascending colon with mild gaseous distention of the right colon and transverse colon. No evidence of bowel obstruction. Stomach and small bowel grossly unremarkable. Vascular/Lymphatic: No evidence of aneurysm or adenopathy. Reproductive: Uterus and adnexa unremarkable.  No mass. Other: No free fluid or free air. Musculoskeletal: Sclerotic lesions throughout the bony pelvis as well as lumbar spine and lower thoracic spine compatible with  sclerotic metastases. IMPRESSION: Numerous ill-defined low-density lesions throughout the liver compatible with metastases. Numerous rounded sclerotic lesions throughout the visualized thoracolumbar spine and pelvis compatible with metastatic disease. Large stool burden and mild gaseous distention of the right colon. Electronically Signed   By: KRolm BaptiseM.D.   On: 01/18/2019 19:58   DG Chest Portable 1 View  Result Date: 02/17/2019 CLINICAL DATA:  Intubation.  Acute respiratory failure. EXAM: PORTABLE CHEST 1 VIEW COMPARISON:  02/17/2019 FINDINGS: The endotracheal tube is 3.3 cm above the carina. The NG tube is coursing down the esophagus and into the stomach. New right IJ central venous catheter tip is in the distal SVC. Stable left-sided Port-A-Cath with its tip in the right atrium. Persistent diffuse interstitial and airspace process in the lungs along with a right-sided pleural effusion. Slight improved lung aeration status post intubation. No pneumothorax. IMPRESSION: 1. The endotracheal tube,  NG tube and right IJ central venous catheter are in good position without complicating features. 2. Diffuse interstitial and airspace process and right pleural effusion. Overall slight improved aeration after intubation. Electronically Signed   By: Marijo Sanes M.D.   On: 02/17/2019 12:34   DG Chest Port 1 View  Result Date: 02/17/2019 CLINICAL DATA:  Acute respiratory failure. COVID-19. Difficulty breathing. EXAM: PORTABLE CHEST 1 VIEW COMPARISON:  Radiograph yesterday. CT 02/10/2019 FINDINGS: Right pleural effusion which may have increased from yesterday. Worsening in diffuse patchy and interstitial opacities. Development of mild pulmonary edema with Kerley B-lines. Normal heart size and mediastinal contours. No pneumothorax. Post right mastectomy. Probable drain in place. Surgical clips in the right axilla. IMPRESSION: 1. Right pleural effusion which is increased from yesterday. 2. Increased interstitial  opacities and Kerley B-lines consistent with pulmonary edema. Additional patchy opacities throughout both lungs may represent pneumonia in the setting of COVID-19. Electronically Signed   By: Keith Rake M.D.   On: 02/17/2019 05:05   DG Chest Port 1 View  Result Date: 03/03/2019 CLINICAL DATA:  Shortness of breath. COVID-19 positive. EXAM: PORTABLE CHEST 1 VIEW COMPARISON:  February 10, 2019. FINDINGS: The heart size and mediastinal contours are within normal limits. Left subclavian Port-A-Cath is unchanged in position. No pneumothorax pleural effusion is noted. Stable bilateral diffuse interstitial densities are noted. The visualized skeletal structures are unremarkable. IMPRESSION: Stable bilateral diffuse interstitial densities are noted concerning for edema or inflammation. Electronically Signed   By: Marijo Conception M.D.   On: 02/06/2019 10:56   DG Chest Portable 1 View  Result Date: 02/10/2019 CLINICAL DATA:  Short of breath.  History of breast cancer. EXAM: PORTABLE CHEST 1 VIEW COMPARISON:  Chest x-ray 01/27/2019.  CT chest 01/28/2019 FINDINGS: Heart size normal. Diffusely increased interstitial markings with mild progression since the prior study. Improvement in right pleural effusion. Heart size normal.  Mild mediastinal adenopathy best seen on CT Port-A-Cath tip in the right atrium unchanged. Soft tissue drain in the right breast. IMPRESSION: Diffuse interstitial markings have progressed in the interval and may be due to infection or edema. Improvement in right pleural effusion. Electronically Signed   By: Franchot Gallo M.D.   On: 02/10/2019 08:49    Assessment and plan- Patient is a 33 y.o. 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  Treatment was on hold as patient went to toilet mastectomy given increasing pain in the chest wall.  Now complicated by acute hypoxic respiratory failure secondary to Covid  pneumonia  I met with patient's mother and her stepfather and had a long discussion about her overall prognosis which is poor.  NP Altha Harm from palliative care was also a part of the conversation. Patient's baseline prognosis from her metastatic breast cancer is poor. She is unlikely to survive a second insult from covid pneumonia. Explained to the patient's family that she is currently in multiorgan failure as evidenced by abnormal LFTs, hypoxic respiratory failure worsening anemia and thrombocytopenia and elevated lactic acid.  She is currently on IV Decadron and remdesivir She is currently intubated and patient's family would like to continue that to see if there is a chance for improvement.  We did discuss the futility of going through a CPR in the event of cardiac arrest.  That CPR would cause more suffering and not lead to any meaningful recovery in this patient.  Patient's mother understands and is agreeable to  no CPR at this time.   Short of CPR please continue current scope of management including mechanical ventilation and treatment of underlying Covid pneumonia.     Total face to face encounter time for this patient visit was 40 min. >50% of the time was  spent in counseling and coordination of care.      Visit Diagnosis 1. COVID-19 virus infection   2. Chest wall deformity, acquired   3. Acute respiratory failure ([place])     Dr. Randa Evens, MD, MPH [facility] at [facility] [phone] 02/17/2019  5:28 PM

## 2019-02-17 NOTE — Progress Notes (Signed)
Pharmacy Antibiotic Note  [name] is a 33 y.o. female admitted on 03/01/2019 with pneumonia.  Pharmacy has been consulted for cefepime dosing.  Plan: Cefepime 2g IV q8h   Vancomycin 1g IV x 1 followed by vancomycin 750mg  IV Q12hr. Will follow serum creatinine daily and obtain levels as clinically indicated.    Weight: 106 lb 0.7 oz (48.1 kg)  Temp (24hrs), Avg:98.8 F (37.1 C), Min:97.5 F (36.4 C), Max:100 F (37.8 C)  Recent Labs  Lab 02/21/2019 1004 02/24/2019 1213 02/10/2019 1449 02/09/2019 1737 02/17/19 0156 02/17/19 0450 02/17/19 1430  WBC 18.4*  --   --   --   --  14.6*  --   CREATININE 0.66  --   --   --   --  0.46 0.65  LATICACIDVEN  --  >11.0* >11.0* >11.0* >11.0* >11.0*  --     Estimated Creatinine Clearance: 75.2 mL/min (by C-G formula based on SCr of 0.65 mg/dL).    No Known Allergies  Antimicrobials this admission: Cefepime 12/13 >> Remdesivir 12/13 >> Vancomycin 12/14 >>   Dose adjustments this admission: N/A  Microbiology results: 12/13 BCx: no growth < 24 hours  12/13 WCx: Abundant Staph Aureus  12/14 MRSA PCR: negative   Thank you for allowing pharmacy to be a part of this patient's care.  Lavonne Cass L 02/17/2019 5:51 PM

## 2019-02-17 NOTE — Procedures (Signed)
Central Venous Catheter Placement:TRIPLE LUMEN Indication: Patient receiving vesicant or irritant drug.; Patient receiving intravenous therapy for longer than 5 days.; Patient has limited or no vascular access.   Consent:emergent    Hand washing performed prior to starting the procedure.   Procedure:   An active timeout was performed and correct patient, name, & ID confirmed.   Patient was positioned correctly for central venous access.  Patient was prepped using strict sterile technique including chlorohexadine preps, sterile drape, sterile gown and sterile gloves.    The area was prepped, draped and anesthetized in the usual sterile manner. Patient comfort was obtained.    A triple lumen catheter was placed in RT  Internal Jugular Vein There was good blood return, catheter caps were placed on lumens, catheter flushed easily, the line was secured and a sterile dressing and BIO-PATCH applied.   Ultrasound was used to visualize vasculature and guidance of needle.   Number of Attempts: 1 Complications:none Estimated Blood Loss: none Chest Radiograph indicated and ordered.  Operator: Merland Holness.   Maurisha Mongeau David Talasia Saulter, M.D.  [place] [facility]  Medical Director ICU-[facility] Medical Director [facility] Cardio-Pulmonary Department     

## 2019-02-17 NOTE — ED Notes (Signed)
Dr Mortimer Fries is at bedside preparing to intubate pt.

## 2019-02-17 NOTE — ED Notes (Addendum)
Versed 2 mg administered by [employer] RN

## 2019-02-17 NOTE — Progress Notes (Addendum)
    BRIEF OVERNIGHT PROGRESS REPORT    SUBJECTIVE: Patient noted with persistent tachycardia and elevated lactic acid >11  OBJECTIVE:On arrival to the ED, she was afebrile with blood pressure 136/84 mm Hg and pulse rate 125 beats/min. There were no focal neurological deficits; she was lethargic but easily awaken.  ASSESSMENT: 33 y.o. 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 Multifocal Pneumonia secondary to COVID-19 infection - Patient presenting with SOB, leukocytosis, generalized weakness, and tachycardia. Oxygen saturation stable on 2L without evidence of hypoxia. - CTA chest shows Multifocal areas of ground-glass attenuation with an appearance consistent with underlying infection. No PE Patient noted with worsening Tachycardia and Lactic acid, will transfer to ICU for close monitoring - Monitor fever curve - Blood cultures pending - Continue Empiric abx with Cefepime - Continue Remdesivir and IV steroids - Inhalers with flutter valve - Daily CRP, CMP, CBC, D-dimer - Recent Echocardiogram with normal LVEF - Continuous pulse oximetry - Vitamins (Zinc and Vitamin C) - IVFs and PRN bolus to keep MAP<62mmHg or SBP <32mmHg monitoring for overload - Consult placed to Intensivist  2. Lactic Acidosis - Likely multifactorial in the setting of infection,neoplasm with mets  - Trend lactic acid, BMP + Mag +Phos + CBC - Serum Osmolality - ABG/VBG to assess severity of acidemia - Repeat CXR - Trend Troponin, EKG for ischemia - Start Bicarb gtt  3. Elevated LFTs - Secondary to mets to liver? - Continue to monitor LFTs - Avoid livertoxins  4. Metastatic triple negative breast cancer with bone, liver and lymph node metastases - S/p right mastectomy with Dr Dahlia Byes on 11/19 -Treated with  palliative chemotherapy with carboplatin and Taxol given weekly until progression or toxicity. - Chemo on hold given above - Follows with oncology Dr. Janese Banks - Palliative care consult     DVT prophylaxis - Hold anti-coagulation for thrombocytopenia (PLTs<100) at high risk for bleeding     Rufina Falco, DNP, CCRN, FNP-C [facility] Nurse Practitioner Between 7pm to [place] - Pager [phone]  After 7am go to [email or web address] - password:[email or web address]  [facility] [employer]  [phone]

## 2019-02-17 NOTE — ED Notes (Signed)
Spoke with charge RN taylor in ICU and will notify ICU MD of pts increased WOB.  Pt remains to have RR 35-40, tachy in 150-160.  Unable to take PO meds r/t dyspnea.

## 2019-02-17 NOTE — ED Notes (Signed)
Spoke with greg charge RN about ICU bed and when will be available as pt is extremely labored with significant increased WOB.  He will check on bed.

## 2019-02-17 NOTE — ED Notes (Addendum)
Vecuronium 10mg  administered by [employer] RN

## 2019-02-17 NOTE — ED Notes (Signed)
Put patient on bedpan, but pt was unable to use the bathroom. Repositioned pt in bed w/ assistance from [facility] ED tech.

## 2019-02-17 NOTE — H&P (Signed)
Name: [name] MRN: [id number] DOB: [birth year]     CONSULTATION DATE: 02/11/2019  REFERRING MD : gherghe  CHIEF COMPLAINT:  resp failure     HISTORY OF PRESENT ILLNESS: 33 y.o. 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    During that ED visit she underwent a CT angiogram which was negative for PE but did show diffuse groundglass infiltrates  Patient with severe acidosis and encephalopathy   ED Course: In the emergency room she is afebrile, normotensive, and she is tachycardic with rates between 130 and 170s.  She is requiring 2 L nasal cannula.  Of note, during her previous ED visit her heart rate was 130.  CMP is pending but CBC shows a white count of 18.4, hemoglobin of 9.8 and platelets of 121.  Chest x-ray showed bilateral infiltrates.  I emergently intubated the patient and placed CVL I attempted to call mother several times but no answer.   Patient is critically ill and dying    PAST MEDICAL HISTORY :   has a past medical history of Anemia and Breast cancer ([place]).  has a past surgical history that includes Cesarean section; Cesarean section (N/A, 04/24/2016); Breast biopsy (Right, 10/25/2018); Breast biopsy (Right, 10/25/2018); Portacath placement (Left, 11/05/2018); Simple mastectomy with axillary sentinel node biopsy (Right, 01/23/2019); and Primary closure (Right, 01/23/2019). Prior to Admission medications   Medication Sig Start Date End Date Taking? Authorizing Provider  dexamethasone  (DECADRON) 4 MG tablet Take 1 tablets by mouth twice daily Patient taking differently: Take 8 mg by mouth 2 (two) times daily.  02/11/19  Yes Borders, Kirt Boys, NP  docusate sodium (COLACE) 100 MG capsule Take 1 capsule (100 mg total) by mouth 2 (two) times daily. 01/29/19  Yes Fritzi Mandes, MD  gabapentin (NEURONTIN) 300 MG capsule Take 1 capsule (300 mg total) by mouth 3 (three) times daily. 11/04/18  Yes Pabon, Diego F, MD  HYDROcodone-acetaminophen (NORCO) 5-325 MG tablet Take 1 tablet by mouth every 4 (four) hours as needed for moderate pain. 02/11/19  Yes Borders, Kirt Boys, NP  lidocaine-prilocaine (EMLA) cream Apply 1 application topically as directed. Apply small amount over port site 1 hour before treatments, cover the cream with saran wrap to protect your clothing 01/13/19  Yes Sindy Guadeloupe, MD  naloxegol oxalate (MOVANTIK) 25 MG TABS tablet Take 1 tablet (25 mg total) by mouth daily. 01/28/19  Yes Sindy Guadeloupe, MD  OLANZapine (ZYPREXA) 10 MG tablet Take 1 tablet (10 mg total) by mouth at bedtime. Take a tablet day 2 through day 5 after each chemo treatment 12/03/18  Yes Sindy Guadeloupe, MD  ondansetron (ZOFRAN) 8 MG tablet Take 1 tablet (8 mg total) by mouth 2 (two) times daily as needed. Start on the third day after chemotherapy. 11/07/18  Yes Sindy Guadeloupe, MD  oxyCODONE (OXYCONTIN) 10 mg 12 hr tablet Take 1 tablet (10 mg total) by mouth every 12 (twelve) hours. 01/29/19  Yes Verlon Au, NP  Oxycodone HCl 10 MG TABS Take 1 tablet (10 mg total) by mouth every 4 (  four) hours as needed for up to 20 days. 01/29/19 02/18/19 Yes Verlon Au, NP  polyethylene glycol (MIRALAX / GLYCOLAX) 17 g packet Take 17 g by mouth daily. 01/29/19  Yes Fritzi Mandes, MD  prochlorperazine (COMPAZINE) 10 MG tablet Take 1 tablet (10 mg total) by mouth every 6 (six) hours as needed (Nausea or vomiting). 11/07/18  Yes Sindy Guadeloupe, MD  oxyCODONE (OXYCONTIN) 10 mg 12 hr tablet Take 1 tablet (10 mg total) by mouth  every 12 (twelve) hours for 15 days. 02/10/19 02/25/19  Blake Divine, MD   No Known Allergies  FAMILY HISTORY:  family history includes Cancer in her maternal grandmother; Diabetes in her maternal grandfather; Heart disease in her maternal grandfather; Hypertension in her maternal grandfather and mother; Stroke in her father. SOCIAL HISTORY:  reports that she has been smoking cigarettes. She has a 1.25 pack-year smoking history. She has never used smokeless tobacco. She reports that she does not drink alcohol or use drugs.  REVIEW OF SYSTEMS:   Unable to obtain due to critical illness    VITAL SIGNS: Temp:  [98.1 F (36.7 C)] 98.1 F (36.7 C) (12/13 2200) Pulse Rate:  [115-159] 159 (12/14 1000) Resp:  [16-36] 32 (12/14 1000) BP: (114-165)/(70-110) 165/110 (12/14 1000) SpO2:  [96 %-100 %] 99 % (12/14 1000)   I/O last 3 completed shifts: In: [id number] [I.V.:866.3; IV Piggyback:4594.8] Out: -  No intake/output data recorded.   SpO2: 99 % O2 Flow Rate (L/min): 2 L/min   Physical Examination:  GENERAL:critically ill appearing, +resp distress HEAD: Normocephalic, atraumatic.  EYES: Pupils equal, round, reactive to light.  No scleral icterus.  MOUTH: Moist mucosal membrane. NECK: Supple. No JVD.  PULMONARY: +rhonchi, +wheezing CARDIOVASCULAR: S1 and S2. Regular rate and rhythm. No murmurs, rubs, or gallops.  GASTROINTESTINAL: Soft, nontender, -distended. No masses. Positive bowel sounds. No hepatosplenomegaly.  MUSCULOSKELETAL: No swelling, clubbing, or edema.  NEUROLOGIC: obtunded SKIN:intact,warm,dry  I personally reviewed lab work that was obtained in last 24 hrs. CXR Independently reviewed-b/l infiltrates  MEDICATIONS: I have reviewed all medications and confirmed regimen as documented   CULTURE RESULTS   Recent Results (from the past 240 hour(s))  Blood Culture (routine x 2)     Status: None (Preliminary result)   Collection Time: 02/21/2019  9:47 AM   Specimen:  BLOOD  Result Value Ref Range Status   Specimen Description BLOOD RIGHT ANTECUBITAL  Final   Special Requests   Final    BOTTLES DRAWN AEROBIC AND ANAEROBIC Blood Culture adequate volume   Culture   Final    NO GROWTH < 24 HOURS Performed at [facility], [address], [place], [place] [address]    Report Status PENDING  Incomplete  Blood Culture (routine x 2)     Status: None (Preliminary result)   Collection Time: 02/27/2019  9:47 AM   Specimen: BLOOD  Result Value Ref Range Status   Specimen Description BLOOD LAC  Final   Special Requests   Final    BOTTLES DRAWN AEROBIC AND ANAEROBIC Blood Culture adequate volume   Culture   Final    NO GROWTH < 24 HOURS Performed at [facility], [address], [place], [place] [address]    Report Status PENDING  Incomplete  Wound or Superficial Culture     Status: None (Preliminary result)   Collection Time: 02/23/2019 10:19 AM   Specimen: Chest; Wound  Result Value Ref Range Status   Specimen Description   Final  CHEST Performed at [facility], [address] [place], [place] [address]    Special Requests   Final    Normal Performed at [facility], [place], [place] [address]    Gram Stain   Final    NO WBC SEEN MODERATE GRAM POSITIVE COCCI IN PAIRS Performed at [facility], [address], [place], [place] [address]    Culture PENDING  Incomplete   Report Status PENDING  Incomplete          IMAGING    DG Chest Port 1 View  Result Date: 02/17/2019 CLINICAL DATA:  Acute respiratory failure. COVID-19. Difficulty breathing. EXAM: PORTABLE CHEST 1 VIEW COMPARISON:  Radiograph yesterday. CT 02/10/2019 FINDINGS: Right pleural effusion which may have increased from yesterday. Worsening in diffuse patchy and interstitial opacities. Development of mild pulmonary edema with Kerley B-lines. Normal heart size and mediastinal contours. No pneumothorax. Post right  mastectomy. Probable drain in place. Surgical clips in the right axilla. IMPRESSION: 1. Right pleural effusion which is increased from yesterday. 2. Increased interstitial opacities and Kerley B-lines consistent with pulmonary edema. Additional patchy opacities throughout both lungs may represent pneumonia in the setting of COVID-19. Electronically Signed   By: Keith Rake M.D.   On: 02/17/2019 05:05    CBC    Component Value Date/Time   WBC 14.6 (H) 02/17/2019 0450   RBC 3.20 (L) 02/17/2019 0450   HGB 8.2 (L) 02/17/2019 0450   HCT 26.6 (L) 02/17/2019 0450   PLT 82 (L) 02/17/2019 0450   MCV 83.1 02/17/2019 0450   MCH 25.6 (L) 02/17/2019 0450   MCHC 30.8 02/17/2019 0450   RDW 19.8 (H) 02/17/2019 0450   LYMPHSABS 1.8 02/17/2019 0450   MONOABS 0.9 02/17/2019 0450   EOSABS 0.0 02/17/2019 0450   BASOSABS 0.0 02/17/2019 0450   BMP Latest Ref Rng & Units 02/17/2019 02/18/2019 02/10/2019  Glucose 70 - 99 mg/dL 82 66(L) 75  BUN 6 - 20 mg/dL 14 14 8   Creatinine 0.44 - 1.00 mg/dL 0.46 0.66 0.52  Sodium 135 - 145 mmol/L 135 131(L) 136  Potassium 3.5 - 5.1 mmol/L 4.8 4.9 3.1(L)  Chloride 98 - 111 mmol/L 104 93(L) 97(L)  CO2 22 - 32 mmol/L 9(L) 9(L) 19(L)  Calcium 8.9 - 10.3 mg/dL 8.8(L) 10.0 9.0       Indwelling Urinary Catheter continued, requirement due to   Reason to continue Indwelling Urinary Catheter strict Intake/Output monitoring for hemodynamic instability   Central Line/ continued, requirement due to  Reason to continue [employer] of central venous pressure or other hemodynamic parameters and poor IV access   Ventilator continued, requirement due to severe respiratory failure   Ventilator Sedation RASS 0 to -2      ASSESSMENT AND PLAN SYNOPSIS 33 y.o. 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, liver, lung metastases, currently undergoing chemotherapy, who presents to the hospital with shortness of  breath and generalized weakness with progressive COVID 19 infection and pneumonia with severe acidosis, RT breast with signs of infection and pus, recent single mastectomy    Severe ACUTE Hypoxic and Hypercapnic Respiratory Failure -continue Full MV support -continue Bronchodilator Therapy For severe acidosis and sepsis  SEVERE ACIDOSIS Started biCARB infusion  NEUROLOGY - intubated and sedated - minimal sedation to achieve a RASS goal: -1   COVID 19 infection pneumonia Start remdisivir Start Antioxidants   CARDIAC ICU monitoring  ID -continue IV  abx as prescibed -follow up cultures  GI GI PROPHYLAXIS as indicated  NUTRITIONAL STATUS DIET-->TF's as tolerated Constipation protocol as indicated   ENDO - will use ICU hypoglycemic\Hyperglycemia protocol if needed    ELECTROLYTES -follow labs as needed -replace as needed -pharmacy consultation and following   DVT/GI PRX ordered TRANSFUSIONS AS NEEDED MONITOR FSBS ASSESS the need for LABS    Critical Care Time devoted to patient care services described in this note is 55 minutes.   Overall, patient is critically ill, prognosis is guarded.  Patient with Multiorgan failure and at high risk for cardiac arrest and death.   Prognosis is very poor  Corrin Parker, M.D.  [facility]  Medical Director [place] Director [facility] Cardio-Pulmonary Department

## 2019-02-17 NOTE — ED Notes (Signed)
Bicarb drip found across from pyxis

## 2019-02-17 NOTE — ED Notes (Signed)
Marya Amsler RN spoke with ICU since no bed yet and, intensivist should be coming to see pt in ED

## 2019-02-17 NOTE — ED Notes (Signed)
Pharm called for bicarb drip

## 2019-02-17 NOTE — Consult Note (Addendum)
[place]  [phone] [phone]   Name: CARL BLEECKER Date: 02/17/2019 MRN: [id number]  DOB: 1985-06-10  Patient Care Team: Bunnie Pion, FNP as PCP - General (Family Medicine) Rico Junker, RN as Registered Nurse Theodore Demark, RN as Registered Nurse    REASON FOR CONSULTATION: Ms. Blossie Raffel is a 33 y.o. 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  She was hospitalized again 01/28/2019-01/29/2019 with intractable back pain.  MRI of the lumbar spine revealed metastatic lesions of T12 and L1.  Patient was also treated for opioid-induced constipation.  Patient presented to the ER on 02/10/2019 with pain and shortness of breath after running out of her pain medications.  She was diagnosed with COVID-19.    Per records, hospitalization was discussed but patient declined due to childcare.  Follow-up telephone visit on 02/11/2019 with referral made to the outpatient treatment facility at [facility] for bamlanivimab.  Patient was also started on oral dexamethasone.  Unfortunately, it does not appear that patient ever went to the infusion center for treatment.  She was readmitted on 03/05/2019 with acute respiratory failure.  CTA of the chest was negative for PE but showed diffuse groundglass infiltrates consistent with multifocal pneumonia from COVID-19.  Lactic acid greater than 11.  Unfortunately, patient later decompensated shortly after admission requiring intubation. Palliative care was consulted to help address goals and manage ongoing symptoms.  SOCIAL HISTORY:     reports that she has been smoking cigarettes. She has a 1.25 pack-year smoking history. She has never used smokeless tobacco. She reports that she does not drink alcohol or use drugs.    Patient is unmarried.  She is a single mother of four children, the oldest of which is 74 and the other children are under the age of 21.  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  Her father lives in the home but is also disabled.  Her mother is involved and sees her daily.  Patient also has a brother who is involved.  ADVANCE DIRECTIVES:  Does not have  CODE STATUS: DNR  PAST MEDICAL HISTORY: Past Medical History:  Diagnosis Date  . Anemia   . Breast cancer ([place])     PAST SURGICAL HISTORY:  Past Surgical History:  Procedure Laterality Date  . BREAST BIOPSY Right 10/25/2018   Heart Clip, Pending path  . BREAST BIOPSY Right 10/25/2018   Lymph node biopsy, Hydromarker (butterfly), path pending  . CESAREAN SECTION    . CESAREAN SECTION N/A 04/24/2016   Procedure: REPEAT CESAREAN SECTION WITH BILATERAL TUBALIGATION;  Surgeon: Brayton Mars, MD;  Location: [facility] ORS;  Service: Obstetrics;  Laterality: N/A;  Baby Boy born @ 80 Apgars:8/9 Weight: 8lb 9oz  . PORTACATH PLACEMENT Left 11/05/2018   Procedure: INSERTION PORT-A-CATH;  Surgeon: Jules Husbands, MD;  Location: [facility] ORS;  Service: General;  Laterality: Left;  . PRIMARY CLOSURE Right 01/23/2019   Procedure: CLOSURE OF RIGHT BREAST;  Surgeon: Wallace Going, DO;  Location: [facility] ORS;  Service: Plastics;  Laterality: Right;  . SIMPLE MASTECTOMY WITH AXILLARY SENTINEL NODE BIOPSY Right 01/23/2019   Procedure: SIMPLE MASTECTOMY;  Surgeon: Jules Husbands, MD;  Location: [facility] ORS;  Service: General;  Laterality: Right;  combined surgery with Dr Baltazar Apo    HEMATOLOGY/ONCOLOGY HISTORY:  Oncology History  Breast cancer ([place])  11/05/2018 Cancer Staging   Staging form: Breast, AJCC 8th Edition - Clinical stage from 11/05/2018: Stage IV (cT4, cN3, cM1, G3, ER-, PR-, HER2-) - Signed by Sindy Guadeloupe, MD on 11/08/2018   11/07/2018 Initial Diagnosis   Breast cancer ([place])   11/12/2018 -  Chemotherapy   The  patient had DOXOrubicin (ADRIAMYCIN) chemo injection 90 mg, 60 mg/m2 = 90 mg, Intravenous,  Once, 2 of 4 cycles Administration: 90 mg (11/12/2018), 90 mg (12/03/2018) palonosetron (ALOXI) injection 0.25 mg, 0.25 mg, Intravenous,  Once, 2 of 4 cycles Administration: 0.25 mg (11/12/2018), 0.25 mg (12/03/2018) pegfilgrastim-jmdb (FULPHILA) injection 6 mg, 6 mg, Subcutaneous,  Once, 2 of 4 cycles Administration: 6 mg (11/13/2018), 6 mg (12/04/2018) cyclophosphamide (CYTOXAN) 900 mg in sodium chloride 0.9 % 250 mL chemo infusion, 600 mg/m2 = 900 mg, Intravenous,  Once, 2 of 4 cycles Administration: 900 mg (11/12/2018), 900 mg (12/03/2018) fosaprepitant (EMEND) 150 mg, dexamethasone (DECADRON) 12 mg in sodium chloride 0.9 % 145 mL IVPB, , Intravenous,  Once, 2 of 4 cycles Administration:  (11/12/2018),  (12/03/2018)  for chemotherapy treatment.      ALLERGIES:  has No Known Allergies.  MEDICATIONS:  Current Facility-Administered Medications  Medication Dose Route Frequency Provider Last Rate Last Admin  . acetaminophen (TYLENOL) tablet 650 mg  650 mg Oral Q6H PRN Caren Griffins, MD   650 mg at 03/06/2019 2215  . ascorbic acid (VITAMIN C) tablet 500 mg  500 mg Per Tube BID Flora Lipps, MD      . ceFEPIme (MAXIPIME) 2 g in sodium chloride 0.9 % 100 mL IVPB  2 g Intravenous Q8H Wang, Hannah L, RPH 200 mL/hr at 02/17/19 1451 2 g at 02/17/19 1451  . enoxaparin (LOVENOX) injection 40 mg  40 mg Subcutaneous Q12H Kasa, Kurian, MD      . fentaNYL 2520mg in NS 2535m(1021mml) infusion-PREMIX  0-400 mcg/hr Intravenous Continuous KasFlora LippsD 2.5 mL/hr at 02/17/19 1242 25 mcg/hr at 02/17/19 1242  . Melatonin TABS 5 mg  5 mg Oral Q24H KasFlora LippsD      . methylPREDNISolone sodium succinate (SOLU-MEDROL) 40 mg/mL injection 24 mg  0.5 mg/kg Intravenous Q12H GheCaren GriffinsD   24 mg at 02/17/19 [id number] naloxegol oxalate (MOVANTIK) tablet 25 mg  25 mg Oral Daily Gherghe, CosVella RedheadD      . norepinephrine  (LEVOPHED) 4mg39m 250mL1mmix infusion  0-40 mcg/min Intravenous Titrated Kasa,Flora Lipps     . [STARDerrill Memo2/15/2020] polyethylene glycol (MIRALAX / GLYCOLAX) packet 17 g  17 g Per Tube Daily Kasa,Flora Lipps     . remdesivir 100 mg in sodium chloride 0.9 % 100 mL IVPB  100 mg Intravenous Daily GhergCaren Griffins  Stopped at 02/17/19 1037  . sodium bicarbonate 150 mEq in dextrose 5 % 1,000 mL infusion   Intravenous Continuous Kasa,Flora Lipps100 mL/hr at 02/17/19 1447 Rate Change at 02/17/19 1447  . vancomycin (VANCOCIN) IVPB 1000 mg/200 mL premix  1,000 mg Intravenous Once Kasa,Flora Lipps200 mL/hr at 02/17/19 1400 1,000 mg at 02/17/19 1400  . [START ON 02/18/2019] zinc sulfate capsule 220 mg  220 mg Per Tube Daily Kasa,Flora Lipps       VITAL SIGNS: BP 103/73   Pulse (!) 159   Temp 99.8 F (37.7 C)   Resp 15   Wt 106 lb 0.7 oz (48.1 kg)  SpO2 100%   BMI 17.65 kg/m  Filed Weights   02/12/2019 0936  Weight: 106 lb 0.7 oz (48.1 kg)    Estimated body mass index is 17.65 kg/m as calculated from the following:   Height as of 02/10/19: _0  (1.651 m).   Weight as of this encounter: 106 lb 0.7 oz (48.1 kg).  LABS: CBC:    Component Value Date/Time   WBC 14.6 (H) 02/17/2019 0450   HGB 8.2 (L) 02/17/2019 0450   HCT 26.6 (L) 02/17/2019 0450   PLT 82 (L) 02/17/2019 0450   MCV 83.1 02/17/2019 0450   NEUTROABS 9.8 (H) 02/17/2019 0450   LYMPHSABS 1.8 02/17/2019 0450   MONOABS 0.9 02/17/2019 0450   EOSABS 0.0 02/17/2019 0450   BASOSABS 0.0 02/17/2019 0450   Comprehensive Metabolic Panel:    Component Value Date/Time   NA 135 02/17/2019 0450   K 4.8 02/17/2019 0450   CL 104 02/17/2019 0450   CO2 9 (L) 02/17/2019 0450   BUN 14 02/17/2019 0450   CREATININE 0.46 02/17/2019 0450   GLUCOSE 82 02/17/2019 0450   CALCIUM 8.8 (L) 02/17/2019 0450   AST 265 (H) 02/17/2019 0450   ALT 160 (H) 02/17/2019 0450   ALKPHOS 1,222 (H) 02/17/2019 0450   BILITOT 0.8 02/17/2019 0450    PROT 5.5 (L) 02/17/2019 0450   ALBUMIN 2.0 (L) 02/17/2019 0450    RADIOGRAPHIC STUDIES: DG Chest 2 View  Result Date: 01/27/2019 CLINICAL DATA:  Right-sided chest pain status post mastectomy EXAM: CHEST - 2 VIEW COMPARISON:  11/05/2018 FINDINGS: Left-sided central venous catheter with tip over the proximal right atrium. Drainage catheter over the right inframammary region. Small right greater than left pleural effusion. Mild airspace disease at the right base. Bilateral hilar fullness, possible nodes. Perihilar interstitial opacity. No pneumothorax. IMPRESSION: 1. Small bilateral pleural effusions, right greater than left with atelectasis or mild infiltrate at the right base. 2. Increased perihilar interstitial opacity which may reflect atypical infection, less likely edema, or possible interstitial spread of disease. 3. Bilateral hilar fullness, cannot exclude hilar nodes. Electronically Signed   By: Donavan Foil M.D.   On: 01/27/2019 22:15   CT Angio Chest PE W/Cm &/Or Wo Cm  Result Date: 02/10/2019 CLINICAL DATA:  History of breast cancer. Shortness of breath for 2 days. Patient status post right mastectomy 01/23/2019 EXAM: CT ANGIOGRAPHY CHEST CT ABDOMEN AND PELVIS WITH CONTRAST TECHNIQUE: Multidetector CT imaging of the chest was performed using the standard protocol during bolus administration of intravenous contrast. Multiplanar CT image reconstructions and MIPs were obtained to evaluate the vascular anatomy. Multidetector CT imaging of the abdomen and pelvis was performed using the standard protocol during bolus administration of intravenous contrast. CONTRAST:  75 mL OMNIPAQUE IOHEXOL 350 MG/ML SOLN COMPARISON:  CT angiogram of the chest 01/28/2019. CT abdomen and pelvis 01/18/2019. FINDINGS: CTA CHEST FINDINGS Cardiovascular: No pulmonary embolus is seen. Heart size is normal. No pericardial effusion. Mediastinum/Nodes: Mediastinal and hilar lymphadenopathy is again seen. 1.6 cm node in the  right hilum on image 45 is identified. An AP window node on image 38 measures 1.1 cm short axis dimension. The appearance is unchanged. The esophagus is unremarkable. The thyroid gland appears normal. Lungs/Pleura: Moderately large right pleural effusion has slightly increased since the prior CT. Scattered areas of ground-glass attenuation throughout both lungs are new since the prior CT. Multiple pulmonary nodules are again identified. A left lower lobe nodule measuring 0.8 cm on image 67 measured 0.6 cm on the  prior examination. A 0.6 cm left lower lobe nodule on the prior examination measures 0.7 cm on image 62, series 4 today. A 0.5 cm left upper lobe nodule on image 33 is new since the prior CT. Compressive atelectasis on the right is noted. Musculoskeletal: Soft tissue gas in the right chest wall from prior mastectomy has resolved since the prior study. There is soft tissue edema and stranding in the right chest wall likely due to postoperative change. A drain is in place. Multiple sclerotic lesions are seen in scattered bones are consistent with metastatic disease. Review of the MIP images confirms the above findings. CT ABDOMEN and PELVIS FINDINGS Hepatobiliary: Again seen are multiple metastatic deposits in the liver including a 2.3 cm lesion in the posterior right hepatic lobe on image 18 which measured 2.1 cm on the prior exam. The gallbladder is mildly distended. Biliary tree is unremarkable. Pancreas: Unremarkable. No pancreatic ductal dilatation or surrounding inflammatory changes. Spleen: Normal in size without focal abnormality. Adrenals/Urinary Tract: Adrenal glands are unremarkable. Kidneys are normal, without renal calculi, focal lesion, or hydronephrosis. Bladder is unremarkable. Stomach/Bowel: Stomach is within normal limits. Appendix appears normal. No evidence of bowel wall thickening, distention, or inflammatory changes. Vascular/Lymphatic: No significant vascular findings are present. No  enlarged abdominal or pelvic lymph nodes. Reproductive: Uterus and bilateral adnexa are unremarkable. Other: There is a small volume of free pelvic fluid which is new since the prior exam. Musculoskeletal: Multiple sclerotic bone lesions consistent with metastatic disease are again seen. Review of the MIP images confirms the above findings. IMPRESSION: Negative for pulmonary embolus. Scattered ground-glass attenuation in both lungs is worrisome for pneumonia, including atypical/viral infection. Moderately large right pleural effusion has increased since the prior chest CT. New pulmonary nodules and increase in the size previously seen nodules consistent with progressive metastatic disease. Additional metastatic disease with lymphadenopathy in the chest, liver lesions and skeletal lesions noted. Small volume of free pelvic fluid is new since the prior abdomen and pelvis CT scan and may be due to volume overload. Postoperative change right chest wall. Surgical drain remains in place. Electronically Signed   By: Inge Rise M.D.   On: 02/10/2019 12:27   CT Angio Chest PE W and/or Wo Contrast  Result Date: 01/28/2019 CLINICAL DATA:  Right upper back and rib pain, increasing drainage from mastectomy site, procedure performed 01/23/2019 EXAM: CT ANGIOGRAPHY CHEST WITH CONTRAST TECHNIQUE: Multidetector CT imaging of the chest was performed using the standard protocol during bolus administration of intravenous contrast. Multiplanar CT image reconstructions and MIPs were obtained to evaluate the vascular anatomy. CONTRAST:  64m OMNIPAQUE IOHEXOL 350 MG/ML SOLN COMPARISON:  PET CT November 04, 2018 FINDINGS: Cardiovascular: Satisfactory opacification the pulmonary arteries to the segmental level. No pulmonary artery filling defects are identified. Central pulmonary arteries are normal caliber. No elevation of the RV/LV ratio (0.7). Left subclavian approach Port-A-Cath tip terminates at the cavoatrial junction. Normal  cardiac size. Trace pericardial fluid. Thoracic aorta is normal caliber. Normal 3 vessel branching of the aortic arch. No luminal irregularity is seen. Mediastinum/Nodes: There is increasing bulky mediastinal, hilar and axillary adenopathy, much of which was previously FDG avid on comparison PET-CT. Reference lymph nodes include a right axillary lymph node measuring 18 mm, previously 14 mm (4/34). A 13 mm left axillary lymph node not clearly evident on comparison study (4/29). A 15 mm right hilar lymph node, measuring approximately 12 mm previously (4/44). And a 12 mm AP window lymph node, not clearly present on  comparison (4/37). No acute abnormality of the trachea or esophagus. Lungs/Pleura: There is a moderate right pleural effusion and trace left effusion. No abnormal pleural thickening or enhancement adjacent areas of passive atelectasis are noted. Some interlobular septal thickening is present as well. There are several scattered subpleural nodules, largest measuring up to 6 mm in size (6/60). Upper Abdomen: Numerous hypoattenuating nodules throughout the liver, not clearly evident on comparison PET-CT. Musculoskeletal: There are postsurgical changes from recent right mastectomy and right axillary nodal dissection with surgical clips. There is soft tissue gas and stranding adjacent the right pectoralis musculature with a a surgical soft tissue drain coursing from the right chest wall and terminating just to the left of midline. Axillary fluid in stranding is noted as well (4/49). There is some abnormal hypoattenuation in the right pectoralis musculature (4/56) nonspecific given recent postoperative state but an intramuscular hematoma, abscess or breast mass is not excluded. Review of the MIP images confirms the above findings. IMPRESSION: 1. No evidence of pulmonary embolism. 2. Postsurgical changes from recent right mastectomy and right axillary nodal dissection with soft tissue gas and stranding adjacent to  the right pectoralis musculature with a surgical soft tissue drain coursing from the right chest wall and terminating just to the left of midline. There is some abnormal hypoattenuation in the medial right pectoralis musculature, nonspecific given recent postoperative state and proximity to the primary breast lesion, but an intramuscular hematoma, abscess or breast mass is not excluded. 3. Numerous hypoattenuating nodules throughout the liver, not clearly evident on comparison PET-CT, concerning for metastatic disease. 4. Increased bulky mediastinal, hilar and axillary adenopathy, much of which was previously FDG avid on comparison PET-CT, concerning for metastatic disease. 5. Few rounded subpleural nodule suspicious for further metastatic disease are present as well. 6. The moderate right and trace left pleural effusions. Malignant effusion is highly likely though no pleural thickening or enhancement is seen. These results were called by telephone at the time of interpretation on 01/28/2019 at 12:36 am to provider Dr Alfred Levins , who verbally acknowledged these results. Electronically Signed   By: Lovena Le M.D.   On: 01/28/2019 00:37   MR LUMBAR SPINE WO CONTRAST  Result Date: 01/28/2019 CLINICAL DATA:  Back pain, greater than 6 weeks conservative treatment, persistent symptoms additional history provided: 33 year old female with known history of metastatic breast cancer status post chemotherapy. Acute onset intractable left upper back pain EXAM: MRI LUMBAR SPINE WITHOUT CONTRAST TECHNIQUE: Multiplanar, multisequence MR imaging of the lumbar spine was performed. No intravenous contrast was administered. COMPARISON:  CT abdomen/pelvis 01/18/2019. FINDINGS: Multiple sequences are mildly motion degraded. Segmentation:  5 lumbar vertebrae Alignment: Straightening of the expected lumbar lordosis. No significant spondylolisthesis. Vertebrae: There is diffuse abnormal T1 hypointense marrow signal. 1.5 cm T2/STIR  hyperintense lesion within the T12 vertebral body suspicious for a metastatic lesion. Multifocal sclerotic metastases within the lower thoracic and lumbar spine as well as bony pelvis were otherwise better appreciated on recent prior CT abdomen/pelvis 01/18/2019. Vertebral body height is maintained. Conus medullaris and cauda equina: Conus extends to the L1 level. No signal abnormality within the visualized distal spinal cord. Paraspinal and other soft tissues: Multiple liver metastases, partially imaged. No other abnormality identified within included portions of the abdomen/retroperitoneum. Paraspinal soft tissues within normal limits. Disc levels: Intervertebral disc height is preserved. There is multilevel mild to moderate facet hypertrophy. Facet hypertrophy results in mild left L4-L5 subarticular narrowing without nerve root impingement. No significant disc herniation, central canal stenosis or  neural foraminal narrowing at any level. IMPRESSION: 1.5 cm T12 vertebral body lesion likely reflecting and osseous metastasis. Numerous additional sclerotic metastases within the lower thoracic and lumbar spine as well as bony pelvis were better appreciated on CT abdomen/pelvis 01/18/2019. No compression fracture. Mild lumbar spondylosis predominantly consisting of facet arthropathy. Mild L4-L5 left subarticular narrowing without nerve root impingement. No significant central canal stenosis or neural foraminal narrowing at any level. Diffuse abnormal T1 hypointense marrow signal which is nonspecific, but likely reflects red marrow reconversion given provided history. Multiple partially imaged liver metastases. Electronically Signed   By: Kellie Simmering DO   On: 01/28/2019 14:40   CT Abdomen Pelvis W Contrast  Result Date: 02/10/2019 CLINICAL DATA:  History of breast cancer. Shortness of breath for 2 days. Patient status post right mastectomy 01/23/2019 EXAM: CT ANGIOGRAPHY CHEST CT ABDOMEN AND PELVIS WITH CONTRAST  TECHNIQUE: Multidetector CT imaging of the chest was performed using the standard protocol during bolus administration of intravenous contrast. Multiplanar CT image reconstructions and MIPs were obtained to evaluate the vascular anatomy. Multidetector CT imaging of the abdomen and pelvis was performed using the standard protocol during bolus administration of intravenous contrast. CONTRAST:  75 mL OMNIPAQUE IOHEXOL 350 MG/ML SOLN COMPARISON:  CT angiogram of the chest 01/28/2019. CT abdomen and pelvis 01/18/2019. FINDINGS: CTA CHEST FINDINGS Cardiovascular: No pulmonary embolus is seen. Heart size is normal. No pericardial effusion. Mediastinum/Nodes: Mediastinal and hilar lymphadenopathy is again seen. 1.6 cm node in the right hilum on image 45 is identified. An AP window node on image 38 measures 1.1 cm short axis dimension. The appearance is unchanged. The esophagus is unremarkable. The thyroid gland appears normal. Lungs/Pleura: Moderately large right pleural effusion has slightly increased since the prior CT. Scattered areas of ground-glass attenuation throughout both lungs are new since the prior CT. Multiple pulmonary nodules are again identified. A left lower lobe nodule measuring 0.8 cm on image 67 measured 0.6 cm on the prior examination. A 0.6 cm left lower lobe nodule on the prior examination measures 0.7 cm on image 62, series 4 today. A 0.5 cm left upper lobe nodule on image 33 is new since the prior CT. Compressive atelectasis on the right is noted. Musculoskeletal: Soft tissue gas in the right chest wall from prior mastectomy has resolved since the prior study. There is soft tissue edema and stranding in the right chest wall likely due to postoperative change. A drain is in place. Multiple sclerotic lesions are seen in scattered bones are consistent with metastatic disease. Review of the MIP images confirms the above findings. CT ABDOMEN and PELVIS FINDINGS Hepatobiliary: Again seen are multiple  metastatic deposits in the liver including a 2.3 cm lesion in the posterior right hepatic lobe on image 18 which measured 2.1 cm on the prior exam. The gallbladder is mildly distended. Biliary tree is unremarkable. Pancreas: Unremarkable. No pancreatic ductal dilatation or surrounding inflammatory changes. Spleen: Normal in size without focal abnormality. Adrenals/Urinary Tract: Adrenal glands are unremarkable. Kidneys are normal, without renal calculi, focal lesion, or hydronephrosis. Bladder is unremarkable. Stomach/Bowel: Stomach is within normal limits. Appendix appears normal. No evidence of bowel wall thickening, distention, or inflammatory changes. Vascular/Lymphatic: No significant vascular findings are present. No enlarged abdominal or pelvic lymph nodes. Reproductive: Uterus and bilateral adnexa are unremarkable. Other: There is a small volume of free pelvic fluid which is new since the prior exam. Musculoskeletal: Multiple sclerotic bone lesions consistent with metastatic disease are again seen. Review of the MIP  images confirms the above findings. IMPRESSION: Negative for pulmonary embolus. Scattered ground-glass attenuation in both lungs is worrisome for pneumonia, including atypical/viral infection. Moderately large right pleural effusion has increased since the prior chest CT. New pulmonary nodules and increase in the size previously seen nodules consistent with progressive metastatic disease. Additional metastatic disease with lymphadenopathy in the chest, liver lesions and skeletal lesions noted. Small volume of free pelvic fluid is new since the prior abdomen and pelvis CT scan and may be due to volume overload. Postoperative change right chest wall. Surgical drain remains in place. Electronically Signed   By: Inge Rise M.D.   On: 02/10/2019 12:27   CT ABDOMEN PELVIS W CONTRAST  Result Date: 01/18/2019 CLINICAL DATA:  Breast cancer.  Left lower back pain. EXAM: CT ABDOMEN AND PELVIS  WITH CONTRAST TECHNIQUE: Multidetector CT imaging of the abdomen and pelvis was performed using the standard protocol following bolus administration of intravenous contrast. CONTRAST:  48m OMNIPAQUE IOHEXOL 300 MG/ML  SOLN COMPARISON:  None. FINDINGS: Lower chest: Small right pleural effusion. Lung bases clear. Heart is normal size. Hepatobiliary: Numerous low-density lesions throughout the liver compatible with metastases. Index lesion in the posterior right hepatic lobe measures 2.1 cm. Gallbladder unremarkable. Pancreas: No focal abnormality or ductal dilatation. Spleen: No focal abnormality.  Normal size. Adrenals/Urinary Tract: No adrenal abnormality. No focal renal abnormality. No stones or hydronephrosis. Urinary bladder is unremarkable. Stomach/Bowel: Large stool burden in the cecum and ascending colon with mild gaseous distention of the right colon and transverse colon. No evidence of bowel obstruction. Stomach and small bowel grossly unremarkable. Vascular/Lymphatic: No evidence of aneurysm or adenopathy. Reproductive: Uterus and adnexa unremarkable.  No mass. Other: No free fluid or free air. Musculoskeletal: Sclerotic lesions throughout the bony pelvis as well as lumbar spine and lower thoracic spine compatible with sclerotic metastases. IMPRESSION: Numerous ill-defined low-density lesions throughout the liver compatible with metastases. Numerous rounded sclerotic lesions throughout the visualized thoracolumbar spine and pelvis compatible with metastatic disease. Large stool burden and mild gaseous distention of the right colon. Electronically Signed   By: KRolm BaptiseM.D.   On: 01/18/2019 19:58   DG Chest Portable 1 View  Result Date: 02/17/2019 CLINICAL DATA:  Intubation.  Acute respiratory failure. EXAM: PORTABLE CHEST 1 VIEW COMPARISON:  02/17/2019 FINDINGS: The endotracheal tube is 3.3 cm above the carina. The NG tube is coursing down the esophagus and into the stomach. New right IJ central  venous catheter tip is in the distal SVC. Stable left-sided Port-A-Cath with its tip in the right atrium. Persistent diffuse interstitial and airspace process in the lungs along with a right-sided pleural effusion. Slight improved lung aeration status post intubation. No pneumothorax. IMPRESSION: 1. The endotracheal tube, NG tube and right IJ central venous catheter are in good position without complicating features. 2. Diffuse interstitial and airspace process and right pleural effusion. Overall slight improved aeration after intubation. Electronically Signed   By: PMarijo SanesM.D.   On: 02/17/2019 12:34   DG Chest Port 1 View  Result Date: 02/17/2019 CLINICAL DATA:  Acute respiratory failure. COVID-19. Difficulty breathing. EXAM: PORTABLE CHEST 1 VIEW COMPARISON:  Radiograph yesterday. CT 02/10/2019 FINDINGS: Right pleural effusion which may have increased from yesterday. Worsening in diffuse patchy and interstitial opacities. Development of mild pulmonary edema with Kerley B-lines. Normal heart size and mediastinal contours. No pneumothorax. Post right mastectomy. Probable drain in place. Surgical clips in the right axilla. IMPRESSION: 1. Right pleural effusion which is increased from yesterday.  2. Increased interstitial opacities and Kerley B-lines consistent with pulmonary edema. Additional patchy opacities throughout both lungs may represent pneumonia in the setting of COVID-19. Electronically Signed   By: Keith Rake M.D.   On: 02/17/2019 05:05   DG Chest Port 1 View  Result Date: 02/25/2019 CLINICAL DATA:  Shortness of breath. COVID-19 positive. EXAM: PORTABLE CHEST 1 VIEW COMPARISON:  February 10, 2019. FINDINGS: The heart size and mediastinal contours are within normal limits. Left subclavian Port-A-Cath is unchanged in position. No pneumothorax pleural effusion is noted. Stable bilateral diffuse interstitial densities are noted. The visualized skeletal structures are unremarkable.  IMPRESSION: Stable bilateral diffuse interstitial densities are noted concerning for edema or inflammation. Electronically Signed   By: Marijo Conception M.D.   On: 02/10/2019 10:56   DG Chest Portable 1 View  Result Date: 02/10/2019 CLINICAL DATA:  Short of breath.  History of breast cancer. EXAM: PORTABLE CHEST 1 VIEW COMPARISON:  Chest x-ray 01/27/2019.  CT chest 01/28/2019 FINDINGS: Heart size normal. Diffusely increased interstitial markings with mild progression since the prior study. Improvement in right pleural effusion. Heart size normal.  Mild mediastinal adenopathy best seen on CT Port-A-Cath tip in the right atrium unchanged. Soft tissue drain in the right breast. IMPRESSION: Diffuse interstitial markings have progressed in the interval and may be due to infection or edema. Improvement in right pleural effusion. Electronically Signed   By: Franchot Gallo M.D.   On: 02/10/2019 08:49    PERFORMANCE STATUS (ECOG) : 4  Review of Systems Unable to provide  Physical Exam General: Critically ill-appearing on vent Cards: Tachycardic Respiratory: Tachypneic on vent Neuro: Sedated  IMPRESSION: Patient is currently critically ill-appearing in ICU on the ventilator.  She has sustained tachycardia with heart rate in 160s.  Patient is receiving fluid boluses and sodium bicarb due to lactic acidosis.  She has not yet on pressors.  Multiple unsuccessful attempts by the critical care team to reach patient's mother.  I was able to call and speak with patient's mother.  I updated her on patient's current medical problems.  She became quite upset to learn of the severity of patient's current illness.  Patient's mother said she was coming to the hospital prior to hanging up the phone.  We will hopefully be able to speak with patient's mother upon her arrival to the hospital.  Note, that I have previously discussed with patient and her wishes for resuscitation.  Patient previously told me that she would  have to think about decision-making but that she felt her mother would likely opt for full code.  Case discussed at length with Dr. Mortimer Fries, nursing, and Dr. Janese Banks.  Addendum:  Dr. Janese Banks and I met with patient's mother and step-father. Together, we reviewed patient's current medical problems. They verbalized and understanding that patient is critically ill and might not survive this hospitalization. Even if she recovers from this critical illness, she still has terminal cancer. Mother would like to continue the current scope of treatment but not escalate care and in the event of cardiac arrest, she would not want [place] to perform CPR/defibrillation. She is in agreement with DNR.   PLAN: -Continue current scope of treatment -DNR -Will follow   Time Total: 60 minutes  Visit consisted of counseling and education dealing with the complex and emotionally intense issues of symptom management and palliative care in the setting of serious and potentially life-threatening illness.Greater than 50%  of this time was spent counseling and coordinating care related to  the above assessment and plan.  Signed by: Altha Harm, PhD, NP-C

## 2019-02-17 NOTE — Procedures (Signed)
Endotracheal Intubation: Patient required placement of an artificial airway secondary to Respiratory Failure  Consent: Emergent.   Hand washing performed prior to starting the procedure.   Medications administered for sedation prior to procedure:  Midazolam 4 mg IV,  Vecuronium 10 mg IV, Fentanyl 100 mcg IV.    A time out procedure was called and correct patient, name, & ID confirmed. Needed supplies and equipment were assembled and checked to include ETT, 10 ml syringe, Glidescope, Mac and Miller blades, suction, oxygen and bag mask valve, end tidal CO2 monitor.   Patient was positioned to align the mouth and pharynx to facilitate visualization of the glottis.   Heart rate, SpO2 and blood pressure was continuously monitored during the procedure. Pre-oxygenation was conducted prior to intubation and endotracheal tube was placed through the vocal cords into the trachea.     The artificial airway was placed under direct visualization via glidescope route using a 8.0 ETT on the first attempt.  ETT was secured at 23 cm mark.  Placement was confirmed by auscuitation of lungs with good breath sounds bilaterally and no stomach sounds.  Condensation was noted on endotracheal tube.   Pulse ox 98%.  CO2 detector in place with appropriate color change.   Complications: None .   Operator: [name].   Chest radiograph ordered and pending.   Comments: OGT placed via glidescope.  [name], M.D.  [facility]  Medical Director ICU-[facility] Medical Director [facility]       

## 2019-02-17 NOTE — ED Notes (Signed)
Dr Mortimer Fries inserting central line.

## 2019-02-18 ENCOUNTER — Other Ambulatory Visit: Payer: Self-pay

## 2019-02-18 DIAGNOSIS — K7200 Acute and subacute hepatic failure without coma: Secondary | ICD-10-CM

## 2019-02-18 LAB — CBC WITH DIFFERENTIAL/PLATELET
Abs Immature Granulocytes: 1.44 10*3/uL — ABNORMAL HIGH (ref 0.00–0.07)
Basophils Absolute: 0.1 10*3/uL (ref 0.0–0.1)
Basophils Relative: 1 %
Eosinophils Absolute: 0 10*3/uL (ref 0.0–0.5)
Eosinophils Relative: 0 %
HCT: 22.7 % — ABNORMAL LOW (ref 36.0–46.0)
Hemoglobin: 7.4 g/dL — ABNORMAL LOW (ref 12.0–15.0)
Immature Granulocytes: 11 %
Lymphocytes Relative: 12 %
Lymphs Abs: 1.6 10*3/uL (ref 0.7–4.0)
MCH: 25.9 pg — ABNORMAL LOW (ref 26.0–34.0)
MCHC: 32.6 g/dL (ref 30.0–36.0)
MCV: 79.4 fL — ABNORMAL LOW (ref 80.0–100.0)
Monocytes Absolute: 0.7 10*3/uL (ref 0.1–1.0)
Monocytes Relative: 6 %
Neutro Abs: 8.9 10*3/uL — ABNORMAL HIGH (ref 1.7–7.7)
Neutrophils Relative %: 70 %
Platelets: 55 10*3/uL — ABNORMAL LOW (ref 150–400)
RBC: 2.86 MIL/uL — ABNORMAL LOW (ref 3.87–5.11)
RDW: 20.2 % — ABNORMAL HIGH (ref 11.5–15.5)
WBC: 12.7 10*3/uL — ABNORMAL HIGH (ref 4.0–10.5)
nRBC: 25.4 % — ABNORMAL HIGH (ref 0.0–0.2)

## 2019-02-18 LAB — C-REACTIVE PROTEIN: CRP: 18.4 mg/dL — ABNORMAL HIGH (ref <1.0)

## 2019-02-18 LAB — BLOOD GAS, ARTERIAL
Acid-base deficit: 13.8 mmol/L — ABNORMAL HIGH (ref 0.0–2.0)
Bicarbonate: 12.7 mmol/L — ABNORMAL LOW (ref 20.0–28.0)
FIO2: 0.35
MECHVT: 450 mL
O2 Saturation: 97.1 %
PEEP: 5 cmH2O
Patient temperature: 37
RATE: 14 resp/min
pCO2 arterial: 31 mmHg — ABNORMAL LOW (ref 32.0–48.0)
pH, Arterial: 7.22 — ABNORMAL LOW (ref 7.350–7.450)
pO2, Arterial: 109 mmHg — ABNORMAL HIGH (ref 83.0–108.0)

## 2019-02-18 LAB — COMPREHENSIVE METABOLIC PANEL
ALT: 138 U/L — ABNORMAL HIGH (ref 0–44)
AST: 529 U/L — ABNORMAL HIGH (ref 15–41)
Albumin: 1.4 g/dL — ABNORMAL LOW (ref 3.5–5.0)
Alkaline Phosphatase: 1549 U/L — ABNORMAL HIGH (ref 38–126)
Anion gap: 24 — ABNORMAL HIGH (ref 5–15)
BUN: 32 mg/dL — ABNORMAL HIGH (ref 6–20)
CO2: 15 mmol/L — ABNORMAL LOW (ref 22–32)
Calcium: 8.5 mg/dL — ABNORMAL LOW (ref 8.9–10.3)
Chloride: 99 mmol/L (ref 98–111)
Creatinine, Ser: 1.09 mg/dL — ABNORMAL HIGH (ref 0.44–1.00)
GFR calc Af Amer: >60 mL/min (ref >60)
GFR calc non Af Amer: >60 mL/min (ref >60)
Glucose, Bld: 103 mg/dL — ABNORMAL HIGH (ref 70–99)
Potassium: 4.7 mmol/L (ref 3.5–5.1)
Sodium: 138 mmol/L (ref 135–145)
Total Bilirubin: 1 mg/dL (ref 0.3–1.2)
Total Protein: 4.2 g/dL — ABNORMAL LOW (ref 6.5–8.1)

## 2019-02-18 LAB — AEROBIC CULTURE W GRAM STAIN (SUPERFICIAL SPECIMEN)
Gram Stain: NONE SEEN
Special Requests: NORMAL

## 2019-02-18 LAB — TRIGLYCERIDES: Triglycerides: 126 mg/dL (ref <150)

## 2019-02-18 LAB — PROCALCITONIN: Procalcitonin: 19.97 ng/mL

## 2019-02-18 LAB — LACTIC ACID, PLASMA: Lactic Acid, Venous: >11 mmol/L (ref 0.5–1.9)

## 2019-02-18 LAB — URINE CULTURE: Culture: <10000 — AB

## 2019-02-18 LAB — FIBRIN DERIVATIVES D-DIMER (ARMC ONLY): Fibrin derivatives D-dimer (ARMC): >7500 ng/mL (FEU) — ABNORMAL HIGH (ref 0.00–499.00)

## 2019-02-18 LAB — MAGNESIUM: Magnesium: 2 mg/dL (ref 1.7–2.4)

## 2019-02-18 LAB — PHOSPHORUS: Phosphorus: 2.3 mg/dL — ABNORMAL LOW (ref 2.5–4.6)

## 2019-02-18 LAB — GLUCOSE, CAPILLARY: Glucose-Capillary: 148 mg/dL — ABNORMAL HIGH (ref 70–99)

## 2019-02-18 MED ORDER — SODIUM BICARBONATE-DEXTROSE 150-5 MEQ/L-% IV SOLN
150.0000 meq | INTRAVENOUS | Status: DC
  Administered 2019-02-18: 150 meq via INTRAVENOUS
  Filled 2019-02-18 (×2): qty 1000

## 2019-02-18 MED ORDER — SODIUM BICARBONATE-DEXTROSE 150-5 MEQ/L-% IV SOLN
150.0000 meq | INTRAVENOUS | Status: DC
  Administered 2019-02-18: 150 meq via INTRAVENOUS
  Filled 2019-02-18: qty 1000

## 2019-02-18 MED ORDER — PANTOPRAZOLE SODIUM 40 MG IV SOLR
40.0000 mg | Freq: Two times a day (BID) | INTRAVENOUS | Status: DC
  Administered 2019-02-18 – 2019-02-19 (×2): 40 mg via INTRAVENOUS
  Filled 2019-02-18 (×2): qty 40

## 2019-02-18 MED ORDER — IBUPROFEN 400 MG PO TABS
800.0000 mg | ORAL_TABLET | Freq: Three times a day (TID) | ORAL | Status: DC | PRN
  Administered 2019-02-18: 800 mg via ORAL
  Filled 2019-02-18: qty 2

## 2019-02-18 MED ORDER — PHENYLEPHRINE CONCENTRATED 100MG/250ML (0.4 MG/ML) INFUSION SIMPLE
0.0000 ug/min | INTRAVENOUS | Status: DC
  Administered 2019-02-18: 200 ug/min via INTRAVENOUS
  Administered 2019-02-18: 300 ug/min via INTRAVENOUS
  Administered 2019-02-18: 50 ug/min via INTRAVENOUS
  Administered 2019-02-19: 133.333 ug/min via INTRAVENOUS
  Administered 2019-02-19 (×2): 400 ug/min via INTRAVENOUS
  Filled 2019-02-18 (×7): qty 250

## 2019-02-18 MED ORDER — VASOPRESSIN 20 UNIT/ML IV SOLN
0.0300 [IU]/min | INTRAVENOUS | Status: DC
  Administered 2019-02-18 – 2019-02-19 (×2): 0.03 [IU]/min via INTRAVENOUS
  Filled 2019-02-18 (×2): qty 2

## 2019-02-18 MED ORDER — SODIUM CHLORIDE 0.9 % IV SOLN
200.0000 mg | Freq: Once | INTRAVENOUS | Status: AC
  Administered 2019-02-18: 200 mg via INTRAVENOUS
  Filled 2019-02-18: qty 200

## 2019-02-18 MED ORDER — LACTATED RINGERS IV BOLUS
3000.0000 mL | Freq: Once | INTRAVENOUS | Status: AC
  Administered 2019-02-18: 3000 mL via INTRAVENOUS

## 2019-02-18 MED ORDER — NOREPINEPHRINE BITARTRATE 1 MG/ML IV SOLN
0.0000 ug/min | INTRAVENOUS | Status: DC
  Filled 2019-02-18: qty 4

## 2019-02-18 MED ORDER — HYDROCORTISONE NA SUCCINATE PF 100 MG IJ SOLR
50.0000 mg | Freq: Four times a day (QID) | INTRAMUSCULAR | Status: DC
  Administered 2019-02-18 – 2019-02-19 (×6): 50 mg via INTRAVENOUS
  Filled 2019-02-18 (×6): qty 2

## 2019-02-18 MED ORDER — METRONIDAZOLE IN NACL 5-0.79 MG/ML-% IV SOLN
500.0000 mg | Freq: Three times a day (TID) | INTRAVENOUS | Status: DC
  Administered 2019-02-18 – 2019-02-19 (×5): 500 mg via INTRAVENOUS
  Filled 2019-02-18 (×7): qty 100

## 2019-02-18 MED ORDER — SODIUM CHLORIDE 0.9 % IV SOLN
100.0000 mg | Freq: Every day | INTRAVENOUS | Status: DC
  Administered 2019-02-19: 100 mg via INTRAVENOUS
  Filled 2019-02-18 (×2): qty 100

## 2019-02-18 MED ORDER — NOREPINEPHRINE 4 MG/250ML-% IV SOLN
0.0000 ug/min | INTRAVENOUS | Status: DC
  Filled 2019-02-18: qty 250

## 2019-02-18 NOTE — Progress Notes (Addendum)
Initial Nutrition Assessment  DOCUMENTATION CODES:   Underweight  INTERVENTION:  Plan is to hold off on initiation of tube feeds at this time.  Once patient more stable and appropriate for tube feeds: -Initiate Vital AF 1.2 Cal at 20 mL/hr and advance by 15 mL/hr every 8 hours to goal rate of 50 mL/hr (1200 mL goal daily volume). Provides 1440 kcal, 90 grams of protein, 972 mL H2O daily. -Provide minimum free water flush of 30 mL Q4hrs to maintain tube patency. -Provide liquid MVI daily per tube.  Monitor magnesium, potassium, and phosphorus daily for at least 3 days, MD to replete as needed, as pt is at risk for refeeding syndrome.  NUTRITION DIAGNOSIS:   Inadequate oral intake related to inability to eat as evidenced by NPO status.  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   Patient with COVID-19 infection on airborne/contact precautions so RD unable to enter room. Patient intubated and sedated. On PRVC mode with FiO2 35% and PEEP 5 cmH2O. Abdomen soft per RN documentation. Last BM unknown/PTA. Plan is to hold off on initiation of enteral nutrition at this time. According to weight history in chart patient was around 130 lbs in 2018. She is now 48.1 kg (106.04 lbs). Unable to trend when weight loss occurred or significance for time frame. RD suspects patient is malnourished but unable to confirm without full nutrition/weight history or NFPE.  Enteral Access: 18 Fr. OGT placed 12/14; terminates in stomach per chest x-ray 12/14; 55 cm at corner of  mouth  MAP: 57-91 mmHg  Patient is currently intubated on ventilator support Ve: 7 L/min Temp (24hrs), Avg:97.4 F (36.3 C), Min:96.6 F (35.9 C), Max:99.3 F (37.4 C)  Propofol: N/A  Medications reviewed and include: vitamin C 500 mg BID, Solu-Cortef 50 mg Q6hrs IV, melatonin 5 mg Q24 hrs, Miralax 17 grams daily, zinc sulfate 220 mg daily, anidulafungin, cefepime, fentanyl gtt, Flagyl, phenylephrine gtt at 300 mcg/min (increase from 100 mcg/min earlier today), remdesivir, sodium bicarbonate 150 mEq in D5 at 125 mL/hr, vancomycin, vasopressin gtt at 0.03 units/min.  Labs reviewed: CO2 15, BUN 32, Creatinine 1.09, Phosphorus 2.3.  I/O: 700 mL UOP yesterday  NUTRITION - FOCUSED PHYSICAL EXAM:  Unable to complete at this time.  Diet Order:   Diet Order    None     EDUCATION NEEDS:   No education needs have been identified at this time  Skin:  Skin Assessment: Skin Integrity Issues:(closed incision right chest)  Last BM:  unknown/PTA  Height:   Ht Readings from Last 1 Encounters:  02/18/19 5\' 5"  (1.651 m)   Weight:   Wt Readings from Last 1 Encounters:  02/22/2019 48.1 kg   Ideal Body Weight:  56.8 kg  BMI:  Body mass index is 17.65 kg/m.  Estimated Nutritional Needs:   Kcal:  [id number] (PSU 2003b w/ MSJ 1184, Ve 7, Tmax 37.4)  Protein:  72-87 grams (1.5-1.8 grams/kg)  Fluid:  1.2-1.4 L/day  Jacklynn Barnacle, MS, RD, LDN Office: [phone] Pager: [phone] After Hours/Weekend Pager: [phone]

## 2019-02-18 NOTE — Progress Notes (Signed)
[place]  [phone] [phone]   Name: [name] Date: 02/18/2019 MRN: [id number]  DOB: 04-16-1985  Patient Care Team: Bunnie Pion, FNP as PCP - General (Family Medicine) Rico Junker, RN as Registered Nurse Theodore Demark, RN as Registered Nurse    REASON FOR CONSULTATION: Ms. [name] is a 33 y.o. 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  Shewas hospitalized again11/24/2020-11/25/2020with intractable back pain. MRI of the lumbar spine revealed metastatic lesions of T12 and L1. Patient was also treated for opioid-induced constipation. Patient presented to the ER on 02/10/2019 with pain and shortness of breath after running out of her pain medications. She was diagnosed with COVID-19.   Per records, hospitalization was discussed but patient declined due to childcare.  Follow-up telephone visit on 02/11/2019 with referral made to the outpatient treatment facility at [facility] for bamlanivimab.  Patient was also started on oral dexamethasone.  Unfortunately, it does not appear that patient ever went to the infusion center for treatment.  She was readmitted on 03/05/2019 with acute respiratory failure.  CTA of the chest was negative for PE but showed diffuse groundglass infiltrates consistent with multifocal pneumonia from COVID-19.  Lactic acid greater than 11.  Unfortunately, patient later decompensated shortly after admission requiring intubation. Palliative care was consulted to help address goals and manage ongoing symptoms.   CODE STATUS: DNR  PAST MEDICAL HISTORY: Past Medical History:  Diagnosis Date  . Anemia   . Breast cancer ([place])     PAST SURGICAL HISTORY:  Past Surgical History:  Procedure Laterality Date  .  BREAST BIOPSY Right 10/25/2018   Heart Clip, Pending path  . BREAST BIOPSY Right 10/25/2018   Lymph node biopsy, Hydromarker (butterfly), path pending  . CESAREAN SECTION    . CESAREAN SECTION N/A 04/24/2016   Procedure: REPEAT CESAREAN SECTION WITH BILATERAL TUBALIGATION;  Surgeon: Brayton Mars, MD;  Location: [facility] ORS;  Service: Obstetrics;  Laterality: N/A;  Baby Boy born @ 58 Apgars:8/9 Weight: 8lb 9oz  . PORTACATH PLACEMENT Left 11/05/2018   Procedure: INSERTION PORT-A-CATH;  Surgeon: Jules Husbands, MD;  Location: [facility] ORS;  Service: General;  Laterality: Left;  . PRIMARY CLOSURE Right 01/23/2019   Procedure: CLOSURE OF RIGHT BREAST;  Surgeon: Wallace Going, DO;  Location: [facility] ORS;  Service: Plastics;  Laterality: Right;  . SIMPLE MASTECTOMY WITH AXILLARY SENTINEL NODE BIOPSY Right 01/23/2019   Procedure: SIMPLE MASTECTOMY;  Surgeon: Jules Husbands, MD;  Location: [facility] ORS;  Service: General;  Laterality: Right;  combined surgery with Dr Baltazar Apo    HEMATOLOGY/ONCOLOGY HISTORY:  Oncology History  Breast cancer ([place])  11/05/2018 Cancer Staging   Staging form: Breast, AJCC 8th Edition - Clinical stage from 11/05/2018: Stage IV (cT4, cN3, cM1, G3, ER-, PR-, HER2-) - Signed by Sindy Guadeloupe, MD on 11/08/2018   11/07/2018 Initial Diagnosis   Breast cancer ([place])   11/12/2018 -  Chemotherapy   The patient had DOXOrubicin (ADRIAMYCIN) chemo injection 90 mg, 60 mg/m2 = 90 mg, Intravenous,  Once, 2 of 4 cycles Administration: 90 mg (11/12/2018), 90 mg (12/03/2018) palonosetron (ALOXI) injection 0.25 mg, 0.25 mg, Intravenous,  Once, 2 of 4 cycles Administration: 0.25 mg (11/12/2018), 0.25 mg (12/03/2018) pegfilgrastim-jmdb (FULPHILA) injection 6 mg, 6 mg, Subcutaneous,  Once, 2 of 4 cycles Administration: 6  mg (11/13/2018), 6 mg (12/04/2018) cyclophosphamide (CYTOXAN) 900 mg in sodium chloride 0.9 % 250 mL chemo infusion, 600 mg/m2 = 900 mg, Intravenous,  Once, 2 of 4  cycles Administration: 900 mg (11/12/2018), 900 mg (12/03/2018) fosaprepitant (EMEND) 150 mg, dexamethasone (DECADRON) 12 mg in sodium chloride 0.9 % 145 mL IVPB, , Intravenous,  Once, 2 of 4 cycles Administration:  (11/12/2018),  (12/03/2018)  for chemotherapy treatment.      ALLERGIES:  has No Known Allergies.  MEDICATIONS:  Current Facility-Administered Medications  Medication Dose Route Frequency Provider Last Rate Last Admin  . acetaminophen (TYLENOL) tablet 650 mg  650 mg Oral Q6H PRN Caren Griffins, MD   650 mg at 02/18/19 6387  . [START ON 02/07/2019] anidulafungin (ERAXIS) 100 mg in sodium chloride 0.9 % 100 mL IVPB  100 mg Intravenous Daily Kasa, Kurian, MD      . ascorbic acid (VITAMIN C) tablet 500 mg  500 mg Per Tube BID Flora Lipps, MD   500 mg at 02/18/19 0817  . ceFEPIme (MAXIPIME) 2 g in sodium chloride 0.9 % 100 mL IVPB  2 g Intravenous Q8H WangLavonda Jumbo, RPH 200 mL/hr at 02/18/19 1500 Rate Verify at 02/18/19 1500  . chlorhexidine gluconate (MEDLINE KIT) (PERIDEX) 0.12 % solution 15 mL  15 mL Mouth Rinse BID Awilda Bill, NP   15 mL at 02/18/19 0811  . Chlorhexidine Gluconate Cloth 2 % PADS 6 each  6 each Topical Daily Flora Lipps, MD   6 each at 02/18/19 0941  . enoxaparin (LOVENOX) injection 40 mg  40 mg Subcutaneous Q12H Flora Lipps, MD   40 mg at 02/18/19 0811  . fentaNYL 2572mg in NS 2556m(1025mml) infusion-PREMIX  0-400 mcg/hr Intravenous Continuous KasFlora LippsD 20 mL/hr at 02/18/19 1500 200 mcg/hr at 02/18/19 1500  . hydrocortisone sodium succinate (SOLU-CORTEF) 100 MG injection 50 mg  50 mg Intravenous Q6H KasFlora LippsD   50 mg at 02/18/19 1228  . ibuprofen (ADVIL) tablet 800 mg  800 mg Oral TID PRN KasFlora LippsD   800 mg at 02/18/19 0942  . MEDLINE mouth rinse  15 mL Mouth Rinse 10 times per day BlaAwilda BillP   15 mL at 02/18/19 1400  . Melatonin TABS 5 mg  5 mg Oral Q24H Kasa, Kurian, MD      . metoprolol tartrate (LOPRESSOR) injection 2.5  mg  2.5 mg Intravenous Q6H PRN BlaAwilda BillP   2.5 mg at 02/18/19 0810  . metroNIDAZOLE (FLAGYL) IVPB 500 mg  500 mg Intravenous Q8HBarnabas ListerD   Stopped at 02/18/19 1315  . midazolam (VERSED) injection 2 mg  2 mg Intravenous Q1H PRN KasFlora LippsD   2 mg at 02/18/19 [id number] naloxegol oxalate (MOVANTIK) tablet 25 mg  25 mg Oral Daily GheCaren GriffinsD   25 mg at 02/18/19 0945  . norepinephrine (LEVOPHED) '4mg'$  in 250m47memix infusion  0-40 mcg/min Intravenous Titrated KasaFlora Lipps      . phenylephrine CONCENTRATED '100mg'$  in sodium chloride 0.9% 250mL70m'4mg'$ /mL) infusion  0-400 mcg/min Intravenous Titrated BlakeAwilda Bill30 mL/hr at 02/18/19 1621 200 mcg/min at 02/18/19 1621  . polyethylene glycol (MIRALAX / GLYCOLAX) packet 17 g  17 g Per Tube Daily Kasa,Flora Lipps  17 g at 02/18/19 0942  . propofol (DIPRIVAN) 1000 MG/100ML infusion  5-80 mcg/kg/min Intravenous Titrated Kasa,Flora Lipps  Stopped at 02/17/19 1753  . remdesivir  100 mg in sodium chloride 0.9 % 100 mL IVPB  100 mg Intravenous Daily Caren Griffins, MD   Stopped at 02/18/19 1015  . sodium bicarbonate 150 mEq in dextrose 5% 1000 mL infusion  150 mEq Intravenous Continuous Awilda Bill, NP 125 mL/hr at 02/18/19 1500 Rate Verify at 02/18/19 1500  . vancomycin (VANCOCIN) IVPB 750 mg/150 ml premix  750 mg Intravenous Q12H Flora Lipps, MD   Stopped at 02/18/19 [phone]  . vasopressin (PITRESSIN) 40 Units in sodium chloride 0.9 % 250 mL (0.16 Units/mL) infusion  0.03 Units/min Intravenous Continuous Kasa, Kurian, MD 11.25 mL/hr at 02/18/19 1500 0.03 Units/min at 02/18/19 1500  . zinc sulfate capsule 220 mg  220 mg Per Tube Daily Flora Lipps, MD   220 mg at 02/18/19 0946    VITAL SIGNS: BP 119/67   Pulse (!) 120   Temp (!) 97.2 F (36.2 C)   Resp 10   Ht _0  (1.651 m)   Wt 106 lb 0.7 oz (48.1 kg)   SpO2 95%   BMI 17.65 kg/m  Filed Weights   02/24/2019 0936  Weight: 106 lb 0.7 oz (48.1 kg)     Estimated body mass index is 17.65 kg/m as calculated from the following:   Height as of this encounter: _1  (1.651 m).   Weight as of this encounter: 106 lb 0.7 oz (48.1 kg).  LABS: CBC:    Component Value Date/Time   WBC 12.7 (H) 02/18/2019 0421   HGB 7.4 (L) 02/18/2019 0421   HCT 22.7 (L) 02/18/2019 0421   PLT 55 (L) 02/18/2019 0421   MCV 79.4 (L) 02/18/2019 0421   NEUTROABS 8.9 (H) 02/18/2019 0421   LYMPHSABS 1.6 02/18/2019 0421   MONOABS 0.7 02/18/2019 0421   EOSABS 0.0 02/18/2019 0421   BASOSABS 0.1 02/18/2019 0421   Comprehensive Metabolic Panel:    Component Value Date/Time   NA 138 02/18/2019 0421   K 4.7 02/18/2019 0421   CL 99 02/18/2019 0421   CO2 15 (L) 02/18/2019 0421   BUN 32 (H) 02/18/2019 0421   CREATININE 1.09 (H) 02/18/2019 0421   GLUCOSE 103 (H) 02/18/2019 0421   CALCIUM 8.5 (L) 02/18/2019 0421   AST 529 (H) 02/18/2019 0421   ALT 138 (H) 02/18/2019 0421   ALKPHOS 1,549 (H) 02/18/2019 0421   BILITOT 1.0 02/18/2019 0421   PROT 4.2 (L) 02/18/2019 0421   ALBUMIN 1.4 (L) 02/18/2019 0421    RADIOGRAPHIC STUDIES: DG Chest 2 View  Result Date: 01/27/2019 CLINICAL DATA:  Right-sided chest pain status post mastectomy EXAM: CHEST - 2 VIEW COMPARISON:  11/05/2018 FINDINGS: Left-sided central venous catheter with tip over the proximal right atrium. Drainage catheter over the right inframammary region. Small right greater than left pleural effusion. Mild airspace disease at the right base. Bilateral hilar fullness, possible nodes. Perihilar interstitial opacity. No pneumothorax. IMPRESSION: 1. Small bilateral pleural effusions, right greater than left with atelectasis or mild infiltrate at the right base. 2. Increased perihilar interstitial opacity which may reflect atypical infection, less likely edema, or possible interstitial spread of disease. 3. Bilateral hilar fullness, cannot exclude hilar nodes. Electronically Signed   By: Donavan Foil M.D.   On:  01/27/2019 22:15   CT Angio Chest PE W/Cm &/Or Wo Cm  Result Date: 02/10/2019 CLINICAL DATA:  History of breast cancer. Shortness of breath for 2 days. Patient status post right mastectomy 01/23/2019 EXAM: CT ANGIOGRAPHY CHEST CT ABDOMEN AND PELVIS WITH CONTRAST TECHNIQUE: Multidetector CT  imaging of the chest was performed using the standard protocol during bolus administration of intravenous contrast. Multiplanar CT image reconstructions and MIPs were obtained to evaluate the vascular anatomy. Multidetector CT imaging of the abdomen and pelvis was performed using the standard protocol during bolus administration of intravenous contrast. CONTRAST:  75 mL OMNIPAQUE IOHEXOL 350 MG/ML SOLN COMPARISON:  CT angiogram of the chest 01/28/2019. CT abdomen and pelvis 01/18/2019. FINDINGS: CTA CHEST FINDINGS Cardiovascular: No pulmonary embolus is seen. Heart size is normal. No pericardial effusion. Mediastinum/Nodes: Mediastinal and hilar lymphadenopathy is again seen. 1.6 cm node in the right hilum on image 45 is identified. An AP window node on image 38 measures 1.1 cm short axis dimension. The appearance is unchanged. The esophagus is unremarkable. The thyroid gland appears normal. Lungs/Pleura: Moderately large right pleural effusion has slightly increased since the prior CT. Scattered areas of ground-glass attenuation throughout both lungs are new since the prior CT. Multiple pulmonary nodules are again identified. A left lower lobe nodule measuring 0.8 cm on image 67 measured 0.6 cm on the prior examination. A 0.6 cm left lower lobe nodule on the prior examination measures 0.7 cm on image 62, series 4 today. A 0.5 cm left upper lobe nodule on image 33 is new since the prior CT. Compressive atelectasis on the right is noted. Musculoskeletal: Soft tissue gas in the right chest wall from prior mastectomy has resolved since the prior study. There is soft tissue edema and stranding in the right chest wall likely due to  postoperative change. A drain is in place. Multiple sclerotic lesions are seen in scattered bones are consistent with metastatic disease. Review of the MIP images confirms the above findings. CT ABDOMEN and PELVIS FINDINGS Hepatobiliary: Again seen are multiple metastatic deposits in the liver including a 2.3 cm lesion in the posterior right hepatic lobe on image 18 which measured 2.1 cm on the prior exam. The gallbladder is mildly distended. Biliary tree is unremarkable. Pancreas: Unremarkable. No pancreatic ductal dilatation or surrounding inflammatory changes. Spleen: Normal in size without focal abnormality. Adrenals/Urinary Tract: Adrenal glands are unremarkable. Kidneys are normal, without renal calculi, focal lesion, or hydronephrosis. Bladder is unremarkable. Stomach/Bowel: Stomach is within normal limits. Appendix appears normal. No evidence of bowel wall thickening, distention, or inflammatory changes. Vascular/Lymphatic: No significant vascular findings are present. No enlarged abdominal or pelvic lymph nodes. Reproductive: Uterus and bilateral adnexa are unremarkable. Other: There is a small volume of free pelvic fluid which is new since the prior exam. Musculoskeletal: Multiple sclerotic bone lesions consistent with metastatic disease are again seen. Review of the MIP images confirms the above findings. IMPRESSION: Negative for pulmonary embolus. Scattered ground-glass attenuation in both lungs is worrisome for pneumonia, including atypical/viral infection. Moderately large right pleural effusion has increased since the prior chest CT. New pulmonary nodules and increase in the size previously seen nodules consistent with progressive metastatic disease. Additional metastatic disease with lymphadenopathy in the chest, liver lesions and skeletal lesions noted. Small volume of free pelvic fluid is new since the prior abdomen and pelvis CT scan and may be due to volume overload. Postoperative change right  chest wall. Surgical drain remains in place. Electronically Signed   By: Inge Rise M.D.   On: 02/10/2019 12:27   CT Angio Chest PE W and/or Wo Contrast  Result Date: 01/28/2019 CLINICAL DATA:  Right upper back and rib pain, increasing drainage from mastectomy site, procedure performed 01/23/2019 EXAM: CT ANGIOGRAPHY CHEST WITH CONTRAST TECHNIQUE: Multidetector CT imaging  of the chest was performed using the standard protocol during bolus administration of intravenous contrast. Multiplanar CT image reconstructions and MIPs were obtained to evaluate the vascular anatomy. CONTRAST:  83m OMNIPAQUE IOHEXOL 350 MG/ML SOLN COMPARISON:  PET CT November 04, 2018 FINDINGS: Cardiovascular: Satisfactory opacification the pulmonary arteries to the segmental level. No pulmonary artery filling defects are identified. Central pulmonary arteries are normal caliber. No elevation of the RV/LV ratio (0.7). Left subclavian approach Port-A-Cath tip terminates at the cavoatrial junction. Normal cardiac size. Trace pericardial fluid. Thoracic aorta is normal caliber. Normal 3 vessel branching of the aortic arch. No luminal irregularity is seen. Mediastinum/Nodes: There is increasing bulky mediastinal, hilar and axillary adenopathy, much of which was previously FDG avid on comparison PET-CT. Reference lymph nodes include a right axillary lymph node measuring 18 mm, previously 14 mm (4/34). A 13 mm left axillary lymph node not clearly evident on comparison study (4/29). A 15 mm right hilar lymph node, measuring approximately 12 mm previously (4/44). And a 12 mm AP window lymph node, not clearly present on comparison (4/37). No acute abnormality of the trachea or esophagus. Lungs/Pleura: There is a moderate right pleural effusion and trace left effusion. No abnormal pleural thickening or enhancement adjacent areas of passive atelectasis are noted. Some interlobular septal thickening is present as well. There are several scattered  subpleural nodules, largest measuring up to 6 mm in size (6/60). Upper Abdomen: Numerous hypoattenuating nodules throughout the liver, not clearly evident on comparison PET-CT. Musculoskeletal: There are postsurgical changes from recent right mastectomy and right axillary nodal dissection with surgical clips. There is soft tissue gas and stranding adjacent the right pectoralis musculature with a a surgical soft tissue drain coursing from the right chest wall and terminating just to the left of midline. Axillary fluid in stranding is noted as well (4/49). There is some abnormal hypoattenuation in the right pectoralis musculature (4/56) nonspecific given recent postoperative state but an intramuscular hematoma, abscess or breast mass is not excluded. Review of the MIP images confirms the above findings. IMPRESSION: 1. No evidence of pulmonary embolism. 2. Postsurgical changes from recent right mastectomy and right axillary nodal dissection with soft tissue gas and stranding adjacent to the right pectoralis musculature with a surgical soft tissue drain coursing from the right chest wall and terminating just to the left of midline. There is some abnormal hypoattenuation in the medial right pectoralis musculature, nonspecific given recent postoperative state and proximity to the primary breast lesion, but an intramuscular hematoma, abscess or breast mass is not excluded. 3. Numerous hypoattenuating nodules throughout the liver, not clearly evident on comparison PET-CT, concerning for metastatic disease. 4. Increased bulky mediastinal, hilar and axillary adenopathy, much of which was previously FDG avid on comparison PET-CT, concerning for metastatic disease. 5. Few rounded subpleural nodule suspicious for further metastatic disease are present as well. 6. The moderate right and trace left pleural effusions. Malignant effusion is highly likely though no pleural thickening or enhancement is seen. These results were called  by telephone at the time of interpretation on 01/28/2019 at 12:36 am to provider Dr VAlfred Levins, who verbally acknowledged these results. Electronically Signed   By: PLovena LeM.D.   On: 01/28/2019 00:37   MR LUMBAR SPINE WO CONTRAST  Result Date: 01/28/2019 CLINICAL DATA:  Back pain, greater than 6 weeks conservative treatment, persistent symptoms additional history provided: 33year old female with known history of metastatic breast cancer status post chemotherapy. Acute onset intractable left upper back pain EXAM: MRI  LUMBAR SPINE WITHOUT CONTRAST TECHNIQUE: Multiplanar, multisequence MR imaging of the lumbar spine was performed. No intravenous contrast was administered. COMPARISON:  CT abdomen/pelvis 01/18/2019. FINDINGS: Multiple sequences are mildly motion degraded. Segmentation:  5 lumbar vertebrae Alignment: Straightening of the expected lumbar lordosis. No significant spondylolisthesis. Vertebrae: There is diffuse abnormal T1 hypointense marrow signal. 1.5 cm T2/STIR hyperintense lesion within the T12 vertebral body suspicious for a metastatic lesion. Multifocal sclerotic metastases within the lower thoracic and lumbar spine as well as bony pelvis were otherwise better appreciated on recent prior CT abdomen/pelvis 01/18/2019. Vertebral body height is maintained. Conus medullaris and cauda equina: Conus extends to the L1 level. No signal abnormality within the visualized distal spinal cord. Paraspinal and other soft tissues: Multiple liver metastases, partially imaged. No other abnormality identified within included portions of the abdomen/retroperitoneum. Paraspinal soft tissues within normal limits. Disc levels: Intervertebral disc height is preserved. There is multilevel mild to moderate facet hypertrophy. Facet hypertrophy results in mild left L4-L5 subarticular narrowing without nerve root impingement. No significant disc herniation, central canal stenosis or neural foraminal narrowing at any  level. IMPRESSION: 1.5 cm T12 vertebral body lesion likely reflecting and osseous metastasis. Numerous additional sclerotic metastases within the lower thoracic and lumbar spine as well as bony pelvis were better appreciated on CT abdomen/pelvis 01/18/2019. No compression fracture. Mild lumbar spondylosis predominantly consisting of facet arthropathy. Mild L4-L5 left subarticular narrowing without nerve root impingement. No significant central canal stenosis or neural foraminal narrowing at any level. Diffuse abnormal T1 hypointense marrow signal which is nonspecific, but likely reflects red marrow reconversion given provided history. Multiple partially imaged liver metastases. Electronically Signed   By: Kellie Simmering DO   On: 01/28/2019 14:40   CT Abdomen Pelvis W Contrast  Result Date: 02/10/2019 CLINICAL DATA:  History of breast cancer. Shortness of breath for 2 days. Patient status post right mastectomy 01/23/2019 EXAM: CT ANGIOGRAPHY CHEST CT ABDOMEN AND PELVIS WITH CONTRAST TECHNIQUE: Multidetector CT imaging of the chest was performed using the standard protocol during bolus administration of intravenous contrast. Multiplanar CT image reconstructions and MIPs were obtained to evaluate the vascular anatomy. Multidetector CT imaging of the abdomen and pelvis was performed using the standard protocol during bolus administration of intravenous contrast. CONTRAST:  75 mL OMNIPAQUE IOHEXOL 350 MG/ML SOLN COMPARISON:  CT angiogram of the chest 01/28/2019. CT abdomen and pelvis 01/18/2019. FINDINGS: CTA CHEST FINDINGS Cardiovascular: No pulmonary embolus is seen. Heart size is normal. No pericardial effusion. Mediastinum/Nodes: Mediastinal and hilar lymphadenopathy is again seen. 1.6 cm node in the right hilum on image 45 is identified. An AP window node on image 38 measures 1.1 cm short axis dimension. The appearance is unchanged. The esophagus is unremarkable. The thyroid gland appears normal. Lungs/Pleura:  Moderately large right pleural effusion has slightly increased since the prior CT. Scattered areas of ground-glass attenuation throughout both lungs are new since the prior CT. Multiple pulmonary nodules are again identified. A left lower lobe nodule measuring 0.8 cm on image 67 measured 0.6 cm on the prior examination. A 0.6 cm left lower lobe nodule on the prior examination measures 0.7 cm on image 62, series 4 today. A 0.5 cm left upper lobe nodule on image 33 is new since the prior CT. Compressive atelectasis on the right is noted. Musculoskeletal: Soft tissue gas in the right chest wall from prior mastectomy has resolved since the prior study. There is soft tissue edema and stranding in the right chest wall likely due to postoperative  change. A drain is in place. Multiple sclerotic lesions are seen in scattered bones are consistent with metastatic disease. Review of the MIP images confirms the above findings. CT ABDOMEN and PELVIS FINDINGS Hepatobiliary: Again seen are multiple metastatic deposits in the liver including a 2.3 cm lesion in the posterior right hepatic lobe on image 18 which measured 2.1 cm on the prior exam. The gallbladder is mildly distended. Biliary tree is unremarkable. Pancreas: Unremarkable. No pancreatic ductal dilatation or surrounding inflammatory changes. Spleen: Normal in size without focal abnormality. Adrenals/Urinary Tract: Adrenal glands are unremarkable. Kidneys are normal, without renal calculi, focal lesion, or hydronephrosis. Bladder is unremarkable. Stomach/Bowel: Stomach is within normal limits. Appendix appears normal. No evidence of bowel wall thickening, distention, or inflammatory changes. Vascular/Lymphatic: No significant vascular findings are present. No enlarged abdominal or pelvic lymph nodes. Reproductive: Uterus and bilateral adnexa are unremarkable. Other: There is a small volume of free pelvic fluid which is new since the prior exam. Musculoskeletal: Multiple  sclerotic bone lesions consistent with metastatic disease are again seen. Review of the MIP images confirms the above findings. IMPRESSION: Negative for pulmonary embolus. Scattered ground-glass attenuation in both lungs is worrisome for pneumonia, including atypical/viral infection. Moderately large right pleural effusion has increased since the prior chest CT. New pulmonary nodules and increase in the size previously seen nodules consistent with progressive metastatic disease. Additional metastatic disease with lymphadenopathy in the chest, liver lesions and skeletal lesions noted. Small volume of free pelvic fluid is new since the prior abdomen and pelvis CT scan and may be due to volume overload. Postoperative change right chest wall. Surgical drain remains in place. Electronically Signed   By: Inge Rise M.D.   On: 02/10/2019 12:27   DG Chest Portable 1 View  Result Date: 02/17/2019 CLINICAL DATA:  Intubation.  Acute respiratory failure. EXAM: PORTABLE CHEST 1 VIEW COMPARISON:  02/17/2019 FINDINGS: The endotracheal tube is 3.3 cm above the carina. The NG tube is coursing down the esophagus and into the stomach. New right IJ central venous catheter tip is in the distal SVC. Stable left-sided Port-A-Cath with its tip in the right atrium. Persistent diffuse interstitial and airspace process in the lungs along with a right-sided pleural effusion. Slight improved lung aeration status post intubation. No pneumothorax. IMPRESSION: 1. The endotracheal tube, NG tube and right IJ central venous catheter are in good position without complicating features. 2. Diffuse interstitial and airspace process and right pleural effusion. Overall slight improved aeration after intubation. Electronically Signed   By: Marijo Sanes M.D.   On: 02/17/2019 12:34   DG Chest Port 1 View  Result Date: 02/17/2019 CLINICAL DATA:  Acute respiratory failure. COVID-19. Difficulty breathing. EXAM: PORTABLE CHEST 1 VIEW COMPARISON:   Radiograph yesterday. CT 02/10/2019 FINDINGS: Right pleural effusion which may have increased from yesterday. Worsening in diffuse patchy and interstitial opacities. Development of mild pulmonary edema with Kerley B-lines. Normal heart size and mediastinal contours. No pneumothorax. Post right mastectomy. Probable drain in place. Surgical clips in the right axilla. IMPRESSION: 1. Right pleural effusion which is increased from yesterday. 2. Increased interstitial opacities and Kerley B-lines consistent with pulmonary edema. Additional patchy opacities throughout both lungs may represent pneumonia in the setting of COVID-19. Electronically Signed   By: Keith Rake M.D.   On: 02/17/2019 05:05   DG Chest Port 1 View  Result Date: 02/14/2019 CLINICAL DATA:  Shortness of breath. COVID-19 positive. EXAM: PORTABLE CHEST 1 VIEW COMPARISON:  February 10, 2019. FINDINGS: The heart  size and mediastinal contours are within normal limits. Left subclavian Port-A-Cath is unchanged in position. No pneumothorax pleural effusion is noted. Stable bilateral diffuse interstitial densities are noted. The visualized skeletal structures are unremarkable. IMPRESSION: Stable bilateral diffuse interstitial densities are noted concerning for edema or inflammation. Electronically Signed   By: Marijo Conception M.D.   On: 02/27/2019 10:56   DG Chest Portable 1 View  Result Date: 02/10/2019 CLINICAL DATA:  Short of breath.  History of breast cancer. EXAM: PORTABLE CHEST 1 VIEW COMPARISON:  Chest x-ray 01/27/2019.  CT chest 01/28/2019 FINDINGS: Heart size normal. Diffusely increased interstitial markings with mild progression since the prior study. Improvement in right pleural effusion. Heart size normal.  Mild mediastinal adenopathy best seen on CT Port-A-Cath tip in the right atrium unchanged. Soft tissue drain in the right breast. IMPRESSION: Diffuse interstitial markings have progressed in the interval and may be due to infection or  edema. Improvement in right pleural effusion. Electronically Signed   By: Franchot Gallo M.D.   On: 02/10/2019 08:49    PERFORMANCE STATUS (ECOG) : 4 - Bedbound  Review of Systems Unable to address  Physical Exam General: critically ill appearing Cardiovascular: tachy Pulmonary: unlabored Neurological: sedated  IMPRESSION: Patient remains critically ill in ICU. Now on pressors. Transaminases climbing.   Prognosis is poor. No family present.   Case discussed with Dr. Mortimer Fries and Dr. Janese Banks.   PLAN: -Continue current scope of treatment -DNR -Will follow   Time Total: 15 minutes  Visit consisted of counseling and education dealing with the complex and emotionally intense issues of symptom management and palliative care in the setting of serious and potentially life-threatening illness.Greater than 50%  of this time was spent counseling and coordinating care related to the above assessment and plan.  Signed by: Altha Harm, PhD, NP-C

## 2019-02-18 NOTE — Progress Notes (Signed)
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   During that ED visit she underwent a CT angiogram which was negative for PE but did show diffuse groundglass infiltrates  Patient with severe acidosis and encephalopathy   ED Course:In the emergency room she is afebrile, normotensive, and she is tachycardic with rates between 130 and 170s. She is requiring 2 L nasal cannula. Of note, during her previous ED visit her heart rate was 130. CMP is pending but CBC shows a white count of 18.4, hemoglobin of 9.8 and platelets of 121. Chest x-ray showed bilateral infiltrates.  I emergently intubated the patient and placed CVL I attempted to call mother several times but no answer.   Patient is critically ill and dying     CC  FOLLOW UP RESP FAILURE FOLLOW UP LIVER FAILURE   SUBJECTIVE REMAINS CRITICALLY ILL COVID 19 INFECTION PROGRESSIVE LIVER FAILURE CT ABD SHOWS LIVER, BONE, LUNG METS   BP 116/81   Pulse (!) 117   Temp (!) 96.6 F (35.9 C)   Resp 12   Ht 5\' 5"  (1.651 m)   Wt 48.1 kg   SpO2 99%   BMI 17.65 kg/m    I/O last 3 completed shifts: In: 5471.9 [I.V.:2368.1; IV Piggyback:3103.8] Out: 730 [Urine:700; Drains:30] No intake/output data recorded.  SpO2: 99 % O2 Flow Rate (L/min): 2 L/min FiO2 (%): 35 %   SIGNIFICANT EVENTS   REVIEW OF SYSTEMS  PATIENT IS UNABLE TO PROVIDE COMPLETE REVIEW OF SYSTEMS DUE TO SEVERE CRITICAL  ILLNESS   PHYSICAL EXAMINATION:  GENERAL:critically ill appearing, +resp distress HEAD: Normocephalic, atraumatic.  EYES: Pupils equal, round, reactive to light.  No scleral icterus.  MOUTH: Moist mucosal membrane. NECK: Supple.  PULMONARY: +rhonchi, +wheezing CARDIOVASCULAR: S1 and S2. Regular rate and rhythm. No murmurs, rubs, or gallops.  GASTROINTESTINAL: Soft, nontender, -distended. No masses. Positive bowel sounds. No hepatosplenomegaly.  MUSCULOSKELETAL: No swelling, clubbing, or edema.  NEUROLOGIC: obtunded, GCS<8 SKIN:intact,warm,dry  MEDICATIONS: I have reviewed all medications and confirmed regimen as documented   CULTURE RESULTS   Recent Results (from the past 240 hour(s))  Blood Culture (routine x 2)     Status: None (Preliminary result)   Collection Time: 02/21/2019  9:47 AM   Specimen: BLOOD  Result Value Ref Range Status   Specimen Description BLOOD RIGHT ANTECUBITAL  Final   Special Requests   Final    BOTTLES DRAWN AEROBIC AND ANAEROBIC Blood Culture adequate volume   Culture   Final    NO GROWTH 2 DAYS Performed at [facility], [address], [place], [place] [address]    Report Status PENDING  Incomplete  Blood Culture (routine x 2)     Status: None (Preliminary result)   Collection Time: 02/04/2019  9:47 AM   Specimen: BLOOD  Result Value Ref Range Status   Specimen Description BLOOD LAC  Final   Special Requests   Final    BOTTLES DRAWN AEROBIC AND ANAEROBIC Blood Culture adequate volume   Culture   Final    NO  GROWTH 2 DAYS Performed at [facility], [address] [place], [place] [address]    Report Status PENDING  Incomplete  Wound or Superficial Culture     Status: None (Preliminary result)   Collection Time: 02/17/2019 10:19 AM   Specimen: Chest; Wound  Result Value Ref Range Status   Specimen Description   Final    CHEST Performed at [facility], [address] [place], [place] [address]    Special  Requests   Final    Normal Performed at [facility], [address] [place], [place] [address]    Gram Stain   Final    NO WBC SEEN MODERATE GRAM POSITIVE COCCI IN PAIRS Performed at [facility], [place] [address], [place], [place] [address]    Culture ABUNDANT STAPHYLOCOCCUS AUREUS  Final   Report Status PENDING  Incomplete  MRSA PCR Screening     Status: None   Collection Time: 02/17/19  2:40 PM   Specimen: Nasal Mucosa; Nasopharyngeal  Result Value Ref Range Status   MRSA by PCR NEGATIVE NEGATIVE Final    Comment:        The GeneXpert MRSA Assay (FDA approved for NASAL specimens only), is one component of a comprehensive MRSA colonization surveillance program. It is not intended to diagnose MRSA infection nor to guide or monitor treatment for MRSA infections. Performed at [facility], [address] [place],  [address]           IMAGING    DG Chest Portable 1 View  Result Date: 02/17/2019 CLINICAL DATA:  Intubation.  Acute respiratory failure. EXAM: PORTABLE CHEST 1 VIEW COMPARISON:  02/17/2019 FINDINGS: The endotracheal tube is 3.3 cm above the carina. The NG tube is coursing down the esophagus and into the stomach. New right IJ central venous catheter tip is in the distal SVC. Stable left-sided Port-A-Cath with its tip in the right atrium. Persistent diffuse interstitial and airspace process in the lungs along with a right-sided pleural effusion. Slight improved lung aeration status post intubation. No pneumothorax. IMPRESSION: 1. The endotracheal tube, NG tube and right IJ central venous catheter are in good position without complicating features. 2. Diffuse interstitial and airspace process and right pleural effusion. Overall slight improved aeration after intubation. Electronically Signed   By: Marijo Sanes M.D.   On: 02/17/2019 12:34       Indwelling Urinary Catheter continued, requirement due to   Reason to continue  Indwelling Urinary Catheter strict Intake/Output monitoring for hemodynamic instability   Central Line/ continued, requirement due to  Reason to continue [place] of central venous pressure or other hemodynamic parameters and poor IV access   Ventilator continued, requirement due to severe respiratory failure   Ventilator Sedation RASS 0 to -2      ASSESSMENT AND PLAN SYNOPSIS  Severe ACUTE Hypoxic and Hypercapnic Respiratory Failure FROM PROGRESSIVE LIVER FAILURE AND METASTATIC BREAST CANCER WITH COVID 19 INFECTION PATIENT IS DYING -continue Mechanical Ventilator support -continue Bronchodilator Therapy -Wean Fio2 and PEEP as tolerated -VAP/VENT bundle implementation   NEUROLOGY - intubated and sedated - minimal sedation to achieve a RASS goal: -1   CARDIAC ICU monitoring  ID-COVID 19 INFECTION -continue IV abx as prescibed -follow up cultures  GI GI PROPHYLAXIS as indicated  NUTRITIONAL STATUS DIET-->TF's as tolerated Constipation protocol as indicated  ENDO - will use ICU hypoglycemic\Hyperglycemia protocol if indicated   ELECTROLYTES -follow labs as needed -replace as needed -pharmacy consultation and following  DVT/GI PRX ordered TRANSFUSIONS AS NEEDED MONITOR FSBS ASSESS the need for LABS as needed   Critical Care Time devoted to patient care services described in this note is 35 minutes.   Overall, patient is critically ill, prognosis is guarded.  Patient with Multiorgan failure and at high risk for cardiac arrest and death.    Corrin Parker, M.D.  [facility]  Medical Director [place] Director [facility] Cardio-Pulmonary Department

## 2019-02-18 NOTE — Progress Notes (Signed)
Hematology/Oncology Consult note [facility]  Telephone:([phone] [phone]  Patient Care Team: Bunnie Pion, FNP as PCP - General (Family Medicine) Rico Junker, RN as Registered Nurse Theodore Demark, RN as Registered Nurse   Name of the patient: [name]  [id number]  1985/05/29   Date of visit: 02/18/19  Interval history- unresponsive, remains intubated  Review of systems- Review of Systems  Unable to perform ROS: Acuity of condition    No Known Allergies   Past Medical History:  Diagnosis Date  . Anemia   . Breast cancer [facility])      Past Surgical History:  Procedure Laterality Date  . BREAST BIOPSY Right 10/25/2018   Heart Clip, Pending path  . BREAST BIOPSY Right 10/25/2018   Lymph node biopsy, Hydromarker (butterfly), path pending  . CESAREAN SECTION    . CESAREAN SECTION N/A 04/24/2016   Procedure: REPEAT CESAREAN SECTION WITH BILATERAL TUBALIGATION;  Surgeon: Brayton Mars, MD;  Location: [facility] ORS;  Service: Obstetrics;  Laterality: N/A;  Baby Boy born @ 25 Apgars:8/9 Weight: 8lb 9oz  . PORTACATH PLACEMENT Left 11/05/2018   Procedure: INSERTION PORT-A-CATH;  Surgeon: Jules Husbands, MD;  Location: [facility] ORS;  Service: General;  Laterality: Left;  . PRIMARY CLOSURE Right 01/23/2019   Procedure: CLOSURE OF RIGHT BREAST;  Surgeon: Wallace Going, DO;  Location: [facility] ORS;  Service: Plastics;  Laterality: Right;  . SIMPLE MASTECTOMY WITH AXILLARY SENTINEL NODE BIOPSY Right 01/23/2019   Procedure: SIMPLE MASTECTOMY;  Surgeon: Jules Husbands, MD;  Location: [facility] ORS;  Service: General;  Laterality: Right;  combined surgery with Dr Baltazar Apo    Social History   Socioeconomic History  . Marital status: Single    Spouse name: Not on file  . Number of children: Not on file  . Years of education: Not on file  . Highest education level: Not on file  Occupational History  . Not on file  Tobacco Use    . Smoking status: Current Every Day Smoker    Packs/day: 0.25    Years: 5.00    Pack years: 1.25    Types: Cigarettes  . Smokeless tobacco: Never Used  Substance and Sexual Activity  . Alcohol use: No  . Drug use: No  . Sexual activity: Yes  Other Topics Concern  . Not on file  Social History Narrative  . Not on file   Social Determinants of Health   Financial Resource Strain:   . Difficulty of Paying Living Expenses: Not on file  Food Insecurity:   . Worried About Charity fundraiser in the Last Year: Not on file  . Ran Out of Food in the Last Year: Not on file  Transportation Needs:   . Lack of Transportation (Medical): Not on file  . Lack of Transportation (Non-Medical): Not on file  Physical Activity:   . Days of Exercise per Week: Not on file  . Minutes of Exercise per Session: Not on file  Stress:   . Feeling of Stress : Not on file  Social Connections:   . Frequency of Communication with Friends and Family: Not on file  . Frequency of Social Gatherings with Friends and Family: Not on file  . Attends Religious Services: Not on file  . Active Member of Clubs or Organizations: Not on file  . Attends Archivist Meetings: Not on file  . Marital Status: Not on file  Intimate Partner Violence:   .  Fear of Current or Ex-Partner: Not on file  . Emotionally Abused: Not on file  . Physically Abused: Not on file  . Sexually Abused: Not on file    Family History  Problem Relation Age of Onset  . Hypertension Mother   . Stroke Father   . Cancer Maternal Grandmother   . Diabetes Maternal Grandfather   . Heart disease Maternal Grandfather   . Hypertension Maternal Grandfather      Current Facility-Administered Medications:  .  acetaminophen (TYLENOL) tablet 650 mg, 650 mg, Oral, Q6H PRN, Caren Griffins, MD, 650 mg at 02/18/19 0625 .  anidulafungin (ERAXIS) 200 mg in sodium chloride 0.9 % 200 mL IVPB, 200 mg, Intravenous, Once, Last Rate: 78 mL/hr at  02/18/19 1230, 200 mg at 02/18/19 1230 **FOLLOWED BY** [START ON 02/10/2019] anidulafungin (ERAXIS) 100 mg in sodium chloride 0.9 % 100 mL IVPB, 100 mg, Intravenous, Daily, Kasa, Kurian, MD .  ascorbic acid (VITAMIN C) tablet 500 mg, 500 mg, Per Tube, BID, Flora Lipps, MD, 500 mg at 02/18/19 0817 .  ceFEPIme (MAXIPIME) 2 g in sodium chloride 0.9 % 100 mL IVPB, 2 g, Intravenous, Q8H, Rocky Morel, RPH, Last Rate: 200 mL/hr at 02/18/19 1455, 2 g at 02/18/19 1455 .  chlorhexidine gluconate (MEDLINE KIT) (PERIDEX) 0.12 % solution 15 mL, 15 mL, Mouth Rinse, BID, Blakeney, Dreama Saa, NP, 15 mL at 02/18/19 0811 .  Chlorhexidine Gluconate Cloth 2 % PADS 6 each, 6 each, Topical, Daily, Flora Lipps, MD, 6 each at 02/18/19 0941 .  enoxaparin (LOVENOX) injection 40 mg, 40 mg, Subcutaneous, Q12H, Kasa, Kurian, MD, 40 mg at 02/18/19 0811 .  fentaNYL 2563mg in NS 2517m(1056mml) infusion-PREMIX, 0-400 mcg/hr, Intravenous, Continuous, Kasa, Kurian, MD, Last Rate: 20 mL/hr at 02/18/19 0432, 200 mcg/hr at 02/18/19 0432 .  hydrocortisone sodium succinate (SOLU-CORTEF) 100 MG injection 50 mg, 50 mg, Intravenous, Q6H, Kasa, Kurian, MD, 50 mg at 02/18/19 1228 .  ibuprofen (ADVIL) tablet 800 mg, 800 mg, Oral, TID PRN, KasFlora LippsD, 800 mg at 02/18/19 0942 .  MEDLINE mouth rinse, 15 mL, Mouth Rinse, 10 times per day, BlaAwilda BillP, 15 mL at 02/18/19 1228 .  Melatonin TABS 5 mg, 5 mg, Oral, Q24H, Kasa, Kurian, MD .  metoprolol tartrate (LOPRESSOR) injection 2.5 mg, 2.5 mg, Intravenous, Q6H PRN, BlaAwilda BillP, 2.5 mg at 02/18/19 0810 .  metroNIDAZOLE (FLAGYL) IVPB 500 mg, 500 mg, Intravenous, Q8H, Kasa, Kurian, MD, Last Rate: 100 mL/hr at 02/18/19 1215, 500 mg at 02/18/19 1215 .  midazolam (VERSED) injection 2 mg, 2 mg, Intravenous, Q1H PRN, KasFlora LippsD, 2 mg at 02/18/19 [id number] naloxegol oxalate (MOVANTIK) tablet 25 mg, 25 mg, Oral, Daily, GheCaren GriffinsD, 25 mg at 02/18/19 0945 .   norepinephrine (LEVOPHED) '4mg'$  in 250m76memix infusion, 0-40 mcg/min, Intravenous, Titrated, Kasa, Kurian, MD .  phenylephrine CONCENTRATED '100mg'$  in sodium chloride 0.9% 250mL69m'4mg'$ /mL) infusion, 0-400 mcg/min, Intravenous, Titrated, Blakeney, Dana Dreama Saa Last Rate: 45 mL/hr at 02/18/19 1251, 300 mcg/min at 02/18/19 1251 .  polyethylene glycol (MIRALAX / GLYCOLAX) packet 17 g, 17 g, Per Tube, Daily, Kasa,Flora Lipps 17 g at 02/18/19 0942 .  propofol (DIPRIVAN) 1000 MG/100ML infusion, 5-80 mcg/kg/min, Intravenous, Titrated, Kasa,Flora Lipps Stopped at 02/17/19 1753 .  [COMPLETED] remdesivir 200 mg in sodium chloride 0.9% 250 mL IVPB, 200 mg, Intravenous, Once, Stopped at 03/02/2019 1322 **FOLLOWED BY** remdesivir 100 mg in sodium chloride 0.9 % 100 mL  IVPB, 100 mg, Intravenous, Daily, Caren Griffins, MD, Last Rate: 200 mL/hr at 02/18/19 0944, 100 mg at 02/18/19 0944 .  sodium bicarbonate 150 mEq in dextrose 5% 1000 mL infusion, 150 mEq, Intravenous, Continuous, Blakeney, Dreama Saa, NP, Last Rate: 125 mL/hr at 02/18/19 1457, 150 mEq at 02/18/19 1457 .  vancomycin (VANCOCIN) IVPB 750 mg/150 ml premix, 750 mg, Intravenous, Q12H, Kasa, Kurian, MD, Last Rate: 150 mL/hr at 02/18/19 0814, 750 mg at 02/18/19 0814 .  vasopressin (PITRESSIN) 40 Units in sodium chloride 0.9 % 250 mL (0.16 Units/mL) infusion, 0.03 Units/min, Intravenous, Continuous, Kasa, Kurian, MD, Last Rate: 11.25 mL/hr at 02/18/19 1227, 0.03 Units/min at 02/18/19 1227 .  zinc sulfate capsule 220 mg, 220 mg, Per Tube, Daily, Flora Lipps, MD, 220 mg at 02/18/19 0946  Physical exam:  Vitals:   02/18/19 1100 02/18/19 1200 02/18/19 1300 02/18/19 1400  BP: (!) 100/53 (!) 91/53 (!) 113/59 121/67  Pulse: (!) 136 (!) 135 (!) 129 (!) 121  Resp: 11 10 (!) 9 (!) 9  Temp: (!) 102.4 F (39.1 C) (!) 101.7 F (38.7 C) 100.2 F (37.9 C) 98.8 F (37.1 C)  TempSrc:  Bladder    SpO2: 95% 95% 92% 91%  Weight:      Height:      general: intubated,  unresponsive on pressors CvS: tachycardic   CMP Latest Ref Rng & Units 02/18/2019  Glucose 70 - 99 mg/dL 103(H)  BUN 6 - 20 mg/dL 32(H)  Creatinine 0.44 - 1.00 mg/dL 1.09(H)  Sodium 135 - 145 mmol/L 138  Potassium 3.5 - 5.1 mmol/L 4.7  Chloride 98 - 111 mmol/L 99  CO2 22 - 32 mmol/L 15(L)  Calcium 8.9 - 10.3 mg/dL 8.5(L)  Total Protein 6.5 - 8.1 g/dL 4.2(L)  Total Bilirubin 0.3 - 1.2 mg/dL 1.0  Alkaline Phos 38 - 126 U/L 1,549(H)  AST 15 - 41 U/L 529(H)  ALT 0 - 44 U/L 138(H)   CBC Latest Ref Rng & Units 02/18/2019  WBC 4.0 - 10.5 K/uL 12.7(H)  Hemoglobin 12.0 - 15.0 g/dL 7.4(L)  Hematocrit 36.0 - 46.0 % 22.7(L)  Platelets 150 - 400 K/uL 55(L)    '@IMAGES'$ @  DG Chest 2 View  Result Date: 01/27/2019 CLINICAL DATA:  Right-sided chest pain status post mastectomy EXAM: CHEST - 2 VIEW COMPARISON:  11/05/2018 FINDINGS: Left-sided central venous catheter with tip over the proximal right atrium. Drainage catheter over the right inframammary region. Small right greater than left pleural effusion. Mild airspace disease at the right base. Bilateral hilar fullness, possible nodes. Perihilar interstitial opacity. No pneumothorax. IMPRESSION: 1. Small bilateral pleural effusions, right greater than left with atelectasis or mild infiltrate at the right base. 2. Increased perihilar interstitial opacity which may reflect atypical infection, less likely edema, or possible interstitial spread of disease. 3. Bilateral hilar fullness, cannot exclude hilar nodes. Electronically Signed   By: Donavan Foil M.D.   On: 01/27/2019 22:15   CT Angio Chest PE W/Cm &/Or Wo Cm  Result Date: 02/10/2019 CLINICAL DATA:  History of breast cancer. Shortness of breath for 2 days. Patient status post right mastectomy 01/23/2019 EXAM: CT ANGIOGRAPHY CHEST CT ABDOMEN AND PELVIS WITH CONTRAST TECHNIQUE: Multidetector CT imaging of the chest was performed using the standard protocol during bolus administration of intravenous  contrast. Multiplanar CT image reconstructions and MIPs were obtained to evaluate the vascular anatomy. Multidetector CT imaging of the abdomen and pelvis was performed using the standard protocol during bolus administration of intravenous contrast.  CONTRAST:  75 mL OMNIPAQUE IOHEXOL 350 MG/ML SOLN COMPARISON:  CT angiogram of the chest 01/28/2019. CT abdomen and pelvis 01/18/2019. FINDINGS: CTA CHEST FINDINGS Cardiovascular: No pulmonary embolus is seen. Heart size is normal. No pericardial effusion. Mediastinum/Nodes: Mediastinal and hilar lymphadenopathy is again seen. 1.6 cm node in the right hilum on image 45 is identified. An AP window node on image 38 measures 1.1 cm short axis dimension. The appearance is unchanged. The esophagus is unremarkable. The thyroid gland appears normal. Lungs/Pleura: Moderately large right pleural effusion has slightly increased since the prior CT. Scattered areas of ground-glass attenuation throughout both lungs are new since the prior CT. Multiple pulmonary nodules are again identified. A left lower lobe nodule measuring 0.8 cm on image 67 measured 0.6 cm on the prior examination. A 0.6 cm left lower lobe nodule on the prior examination measures 0.7 cm on image 62, series 4 today. A 0.5 cm left upper lobe nodule on image 33 is new since the prior CT. Compressive atelectasis on the right is noted. Musculoskeletal: Soft tissue gas in the right chest wall from prior mastectomy has resolved since the prior study. There is soft tissue edema and stranding in the right chest wall likely due to postoperative change. A drain is in place. Multiple sclerotic lesions are seen in scattered bones are consistent with metastatic disease. Review of the MIP images confirms the above findings. CT ABDOMEN and PELVIS FINDINGS Hepatobiliary: Again seen are multiple metastatic deposits in the liver including a 2.3 cm lesion in the posterior right hepatic lobe on image 18 which measured 2.1 cm on the  prior exam. The gallbladder is mildly distended. Biliary tree is unremarkable. Pancreas: Unremarkable. No pancreatic ductal dilatation or surrounding inflammatory changes. Spleen: Normal in size without focal abnormality. Adrenals/Urinary Tract: Adrenal glands are unremarkable. Kidneys are normal, without renal calculi, focal lesion, or hydronephrosis. Bladder is unremarkable. Stomach/Bowel: Stomach is within normal limits. Appendix appears normal. No evidence of bowel wall thickening, distention, or inflammatory changes. Vascular/Lymphatic: No significant vascular findings are present. No enlarged abdominal or pelvic lymph nodes. Reproductive: Uterus and bilateral adnexa are unremarkable. Other: There is a small volume of free pelvic fluid which is new since the prior exam. Musculoskeletal: Multiple sclerotic bone lesions consistent with metastatic disease are again seen. Review of the MIP images confirms the above findings. IMPRESSION: Negative for pulmonary embolus. Scattered ground-glass attenuation in both lungs is worrisome for pneumonia, including atypical/viral infection. Moderately large right pleural effusion has increased since the prior chest CT. New pulmonary nodules and increase in the size previously seen nodules consistent with progressive metastatic disease. Additional metastatic disease with lymphadenopathy in the chest, liver lesions and skeletal lesions noted. Small volume of free pelvic fluid is new since the prior abdomen and pelvis CT scan and may be due to volume overload. Postoperative change right chest wall. Surgical drain remains in place. Electronically Signed   By: Inge Rise M.D.   On: 02/10/2019 12:27   CT Angio Chest PE W and/or Wo Contrast  Result Date: 01/28/2019 CLINICAL DATA:  Right upper back and rib pain, increasing drainage from mastectomy site, procedure performed 01/23/2019 EXAM: CT ANGIOGRAPHY CHEST WITH CONTRAST TECHNIQUE: Multidetector CT imaging of the chest  was performed using the standard protocol during bolus administration of intravenous contrast. Multiplanar CT image reconstructions and MIPs were obtained to evaluate the vascular anatomy. CONTRAST:  29m OMNIPAQUE IOHEXOL 350 MG/ML SOLN COMPARISON:  PET CT November 04, 2018 FINDINGS: Cardiovascular: Satisfactory opacification the pulmonary  arteries to the segmental level. No pulmonary artery filling defects are identified. Central pulmonary arteries are normal caliber. No elevation of the RV/LV ratio (0.7). Left subclavian approach Port-A-Cath tip terminates at the cavoatrial junction. Normal cardiac size. Trace pericardial fluid. Thoracic aorta is normal caliber. Normal 3 vessel branching of the aortic arch. No luminal irregularity is seen. Mediastinum/Nodes: There is increasing bulky mediastinal, hilar and axillary adenopathy, much of which was previously FDG avid on comparison PET-CT. Reference lymph nodes include a right axillary lymph node measuring 18 mm, previously 14 mm (4/34). A 13 mm left axillary lymph node not clearly evident on comparison study (4/29). A 15 mm right hilar lymph node, measuring approximately 12 mm previously (4/44). And a 12 mm AP window lymph node, not clearly present on comparison (4/37). No acute abnormality of the trachea or esophagus. Lungs/Pleura: There is a moderate right pleural effusion and trace left effusion. No abnormal pleural thickening or enhancement adjacent areas of passive atelectasis are noted. Some interlobular septal thickening is present as well. There are several scattered subpleural nodules, largest measuring up to 6 mm in size (6/60). Upper Abdomen: Numerous hypoattenuating nodules throughout the liver, not clearly evident on comparison PET-CT. Musculoskeletal: There are postsurgical changes from recent right mastectomy and right axillary nodal dissection with surgical clips. There is soft tissue gas and stranding adjacent the right pectoralis musculature with a a  surgical soft tissue drain coursing from the right chest wall and terminating just to the left of midline. Axillary fluid in stranding is noted as well (4/49). There is some abnormal hypoattenuation in the right pectoralis musculature (4/56) nonspecific given recent postoperative state but an intramuscular hematoma, abscess or breast mass is not excluded. Review of the MIP images confirms the above findings. IMPRESSION: 1. No evidence of pulmonary embolism. 2. Postsurgical changes from recent right mastectomy and right axillary nodal dissection with soft tissue gas and stranding adjacent to the right pectoralis musculature with a surgical soft tissue drain coursing from the right chest wall and terminating just to the left of midline. There is some abnormal hypoattenuation in the medial right pectoralis musculature, nonspecific given recent postoperative state and proximity to the primary breast lesion, but an intramuscular hematoma, abscess or breast mass is not excluded. 3. Numerous hypoattenuating nodules throughout the liver, not clearly evident on comparison PET-CT, concerning for metastatic disease. 4. Increased bulky mediastinal, hilar and axillary adenopathy, much of which was previously FDG avid on comparison PET-CT, concerning for metastatic disease. 5. Few rounded subpleural nodule suspicious for further metastatic disease are present as well. 6. The moderate right and trace left pleural effusions. Malignant effusion is highly likely though no pleural thickening or enhancement is seen. These results were called by telephone at the time of interpretation on 01/28/2019 at 12:36 am to provider Dr Alfred Levins , who verbally acknowledged these results. Electronically Signed   By: Lovena Le M.D.   On: 01/28/2019 00:37   MR LUMBAR SPINE WO CONTRAST  Result Date: 01/28/2019 CLINICAL DATA:  Back pain, greater than 6 weeks conservative treatment, persistent symptoms additional history provided: 33 year old  female with known history of metastatic breast cancer status post chemotherapy. Acute onset intractable left upper back pain EXAM: MRI LUMBAR SPINE WITHOUT CONTRAST TECHNIQUE: Multiplanar, multisequence MR imaging of the lumbar spine was performed. No intravenous contrast was administered. COMPARISON:  CT abdomen/pelvis 01/18/2019. FINDINGS: Multiple sequences are mildly motion degraded. Segmentation:  5 lumbar vertebrae Alignment: Straightening of the expected lumbar lordosis. No significant spondylolisthesis. Vertebrae: There  is diffuse abnormal T1 hypointense marrow signal. 1.5 cm T2/STIR hyperintense lesion within the T12 vertebral body suspicious for a metastatic lesion. Multifocal sclerotic metastases within the lower thoracic and lumbar spine as well as bony pelvis were otherwise better appreciated on recent prior CT abdomen/pelvis 01/18/2019. Vertebral body height is maintained. Conus medullaris and cauda equina: Conus extends to the L1 level. No signal abnormality within the visualized distal spinal cord. Paraspinal and other soft tissues: Multiple liver metastases, partially imaged. No other abnormality identified within included portions of the abdomen/retroperitoneum. Paraspinal soft tissues within normal limits. Disc levels: Intervertebral disc height is preserved. There is multilevel mild to moderate facet hypertrophy. Facet hypertrophy results in mild left L4-L5 subarticular narrowing without nerve root impingement. No significant disc herniation, central canal stenosis or neural foraminal narrowing at any level. IMPRESSION: 1.5 cm T12 vertebral body lesion likely reflecting and osseous metastasis. Numerous additional sclerotic metastases within the lower thoracic and lumbar spine as well as bony pelvis were better appreciated on CT abdomen/pelvis 01/18/2019. No compression fracture. Mild lumbar spondylosis predominantly consisting of facet arthropathy. Mild L4-L5 left subarticular narrowing without  nerve root impingement. No significant central canal stenosis or neural foraminal narrowing at any level. Diffuse abnormal T1 hypointense marrow signal which is nonspecific, but likely reflects red marrow reconversion given provided history. Multiple partially imaged liver metastases. Electronically Signed   By: Kellie Simmering DO   On: 01/28/2019 14:40   CT Abdomen Pelvis W Contrast  Result Date: 02/10/2019 CLINICAL DATA:  History of breast cancer. Shortness of breath for 2 days. Patient status post right mastectomy 01/23/2019 EXAM: CT ANGIOGRAPHY CHEST CT ABDOMEN AND PELVIS WITH CONTRAST TECHNIQUE: Multidetector CT imaging of the chest was performed using the standard protocol during bolus administration of intravenous contrast. Multiplanar CT image reconstructions and MIPs were obtained to evaluate the vascular anatomy. Multidetector CT imaging of the abdomen and pelvis was performed using the standard protocol during bolus administration of intravenous contrast. CONTRAST:  75 mL OMNIPAQUE IOHEXOL 350 MG/ML SOLN COMPARISON:  CT angiogram of the chest 01/28/2019. CT abdomen and pelvis 01/18/2019. FINDINGS: CTA CHEST FINDINGS Cardiovascular: No pulmonary embolus is seen. Heart size is normal. No pericardial effusion. Mediastinum/Nodes: Mediastinal and hilar lymphadenopathy is again seen. 1.6 cm node in the right hilum on image 45 is identified. An AP window node on image 38 measures 1.1 cm short axis dimension. The appearance is unchanged. The esophagus is unremarkable. The thyroid gland appears normal. Lungs/Pleura: Moderately large right pleural effusion has slightly increased since the prior CT. Scattered areas of ground-glass attenuation throughout both lungs are new since the prior CT. Multiple pulmonary nodules are again identified. A left lower lobe nodule measuring 0.8 cm on image 67 measured 0.6 cm on the prior examination. A 0.6 cm left lower lobe nodule on the prior examination measures 0.7 cm on image  62, series 4 today. A 0.5 cm left upper lobe nodule on image 33 is new since the prior CT. Compressive atelectasis on the right is noted. Musculoskeletal: Soft tissue gas in the right chest wall from prior mastectomy has resolved since the prior study. There is soft tissue edema and stranding in the right chest wall likely due to postoperative change. A drain is in place. Multiple sclerotic lesions are seen in scattered bones are consistent with metastatic disease. Review of the MIP images confirms the above findings. CT ABDOMEN and PELVIS FINDINGS Hepatobiliary: Again seen are multiple metastatic deposits in the liver including a 2.3 cm lesion in  the posterior right hepatic lobe on image 18 which measured 2.1 cm on the prior exam. The gallbladder is mildly distended. Biliary tree is unremarkable. Pancreas: Unremarkable. No pancreatic ductal dilatation or surrounding inflammatory changes. Spleen: Normal in size without focal abnormality. Adrenals/Urinary Tract: Adrenal glands are unremarkable. Kidneys are normal, without renal calculi, focal lesion, or hydronephrosis. Bladder is unremarkable. Stomach/Bowel: Stomach is within normal limits. Appendix appears normal. No evidence of bowel wall thickening, distention, or inflammatory changes. Vascular/Lymphatic: No significant vascular findings are present. No enlarged abdominal or pelvic lymph nodes. Reproductive: Uterus and bilateral adnexa are unremarkable. Other: There is a small volume of free pelvic fluid which is new since the prior exam. Musculoskeletal: Multiple sclerotic bone lesions consistent with metastatic disease are again seen. Review of the MIP images confirms the above findings. IMPRESSION: Negative for pulmonary embolus. Scattered ground-glass attenuation in both lungs is worrisome for pneumonia, including atypical/viral infection. Moderately large right pleural effusion has increased since the prior chest CT. New pulmonary nodules and increase in the  size previously seen nodules consistent with progressive metastatic disease. Additional metastatic disease with lymphadenopathy in the chest, liver lesions and skeletal lesions noted. Small volume of free pelvic fluid is new since the prior abdomen and pelvis CT scan and may be due to volume overload. Postoperative change right chest wall. Surgical drain remains in place. Electronically Signed   By: Inge Rise M.D.   On: 02/10/2019 12:27   DG Chest Portable 1 View  Result Date: 02/17/2019 CLINICAL DATA:  Intubation.  Acute respiratory failure. EXAM: PORTABLE CHEST 1 VIEW COMPARISON:  02/17/2019 FINDINGS: The endotracheal tube is 3.3 cm above the carina. The NG tube is coursing down the esophagus and into the stomach. New right IJ central venous catheter tip is in the distal SVC. Stable left-sided Port-A-Cath with its tip in the right atrium. Persistent diffuse interstitial and airspace process in the lungs along with a right-sided pleural effusion. Slight improved lung aeration status post intubation. No pneumothorax. IMPRESSION: 1. The endotracheal tube, NG tube and right IJ central venous catheter are in good position without complicating features. 2. Diffuse interstitial and airspace process and right pleural effusion. Overall slight improved aeration after intubation. Electronically Signed   By: Marijo Sanes M.D.   On: 02/17/2019 12:34   DG Chest Port 1 View  Result Date: 02/17/2019 CLINICAL DATA:  Acute respiratory failure. COVID-19. Difficulty breathing. EXAM: PORTABLE CHEST 1 VIEW COMPARISON:  Radiograph yesterday. CT 02/10/2019 FINDINGS: Right pleural effusion which may have increased from yesterday. Worsening in diffuse patchy and interstitial opacities. Development of mild pulmonary edema with Kerley B-lines. Normal heart size and mediastinal contours. No pneumothorax. Post right mastectomy. Probable drain in place. Surgical clips in the right axilla. IMPRESSION: 1. Right pleural effusion  which is increased from yesterday. 2. Increased interstitial opacities and Kerley B-lines consistent with pulmonary edema. Additional patchy opacities throughout both lungs may represent pneumonia in the setting of COVID-19. Electronically Signed   By: Keith Rake M.D.   On: 02/17/2019 05:05   DG Chest Port 1 View  Result Date: 03/04/2019 CLINICAL DATA:  Shortness of breath. COVID-19 positive. EXAM: PORTABLE CHEST 1 VIEW COMPARISON:  February 10, 2019. FINDINGS: The heart size and mediastinal contours are within normal limits. Left subclavian Port-A-Cath is unchanged in position. No pneumothorax pleural effusion is noted. Stable bilateral diffuse interstitial densities are noted. The visualized skeletal structures are unremarkable. IMPRESSION: Stable bilateral diffuse interstitial densities are noted concerning for edema or inflammation. Electronically Signed  By: Marijo Conception M.D.   On: 02/10/2019 10:56   DG Chest Portable 1 View  Result Date: 02/10/2019 CLINICAL DATA:  Short of breath.  History of breast cancer. EXAM: PORTABLE CHEST 1 VIEW COMPARISON:  Chest x-ray 01/27/2019.  CT chest 01/28/2019 FINDINGS: Heart size normal. Diffusely increased interstitial markings with mild progression since the prior study. Improvement in right pleural effusion. Heart size normal.  Mild mediastinal adenopathy best seen on CT Port-A-Cath tip in the right atrium unchanged. Soft tissue drain in the right breast. IMPRESSION: Diffuse interstitial markings have progressed in the interval and may be due to infection or edema. Improvement in right pleural effusion. Electronically Signed   By: Franchot Gallo M.D.   On: 02/10/2019 08:49     Assessment and plan- Patient is a 33 y.o. 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  Patient continues to be on IV Decadron and remdesivir for Covid  pneumonia.  She is intubated and has evidence of multiorgan failure including possible ischemic injury to the liver as evidenced by elevated liver enzymes.  Now on pressors. Suspect anemia and thrombocytopenia secondary to bone marrow suppression from acute infection.  She is also on antifungal and cefepime and vancomycin.  2.  Overall prognosis remains extremely poor.  She is unlikely to survive this hospitalization and patient's family-her mother and stepdad are aware of the situation.  They would like to continue current scope of treatment.  She is for no CPR at this time. I did attempt to call patients mother but could not reach her   Visit Diagnosis 1. COVID-19 virus infection   2. Chest wall deformity, acquired   3. Acute respiratory failure (HCC)   4. Elevated liver enzymes      Dr. Randa Evens, MD, MPH [facility] [phone] 02/18/2019 3:41 PM

## 2019-02-18 NOTE — Progress Notes (Signed)
Ch visited with pt mother that was monitoring the pt's progress outside of pt's room. Ch provided emotional and grief support as the pt's mother shared how the pt had been declining significantly over the past few months related to her breast cancer metastasizing and the limitations of childcare related to her receiving chemo treatments. The pt recently had a mastectomy of one of her breast as she c/o pain from the breast and also back pain. Ch allowed space for the pt mother Clarene Critchley) to lament about what to do next and how hard her daughter worked to take care of her children which are all under the age of 86 y.o. 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 The pt's mom is aware of how critical the patient current condition is but does not want to face the guilt of having to make the decision to take her off life support. Ch offered prayer, spiritual and emotional support for the pt and communicated with the staff the concerns the pt's mom has about how the pt will be handled if she passes soon.  F/u recommended via telephone as the physical visitation will be limited after 12.15.20.    02/18/19 1400  Clinical Encounter Type  Visited With Health care provider;Patient and family together  Visit Type Spiritual support;Social support;Critical Care  Referral From Chaplain  Consult/Referral To Chaplain  Spiritual Encounters  Spiritual Needs Prayer;Emotional;Grief support  Stress Factors  Patient Stress Factors Exhausted;Family relationships;Health changes;Loss of control;Major life changes  Family Stress Factors Major life changes;Loss of control

## 2019-02-19 DIAGNOSIS — K7201 Acute and subacute hepatic failure with coma: Secondary | ICD-10-CM

## 2019-02-19 LAB — PROCALCITONIN: Procalcitonin: 31.97 ng/mL

## 2019-02-19 LAB — TYPE AND SCREEN
ABO/RH(D): A POS
Antibody Screen: NEGATIVE

## 2019-02-19 LAB — COMPREHENSIVE METABOLIC PANEL
ALT: 383 U/L — ABNORMAL HIGH (ref 0–44)
AST: 4019 U/L — ABNORMAL HIGH (ref 15–41)
Albumin: 1.2 g/dL — ABNORMAL LOW (ref 3.5–5.0)
Alkaline Phosphatase: 2215 U/L — ABNORMAL HIGH (ref 38–126)
Anion gap: 29 — ABNORMAL HIGH (ref 5–15)
BUN: 41 mg/dL — ABNORMAL HIGH (ref 6–20)
CO2: 13 mmol/L — ABNORMAL LOW (ref 22–32)
Calcium: 7.1 mg/dL — ABNORMAL LOW (ref 8.9–10.3)
Chloride: 94 mmol/L — ABNORMAL LOW (ref 98–111)
Creatinine, Ser: 2.07 mg/dL — ABNORMAL HIGH (ref 0.44–1.00)
GFR calc Af Amer: 35 mL/min — ABNORMAL LOW (ref >60)
GFR calc non Af Amer: 30 mL/min — ABNORMAL LOW (ref >60)
Glucose, Bld: 196 mg/dL — ABNORMAL HIGH (ref 70–99)
Potassium: 5.3 mmol/L — ABNORMAL HIGH (ref 3.5–5.1)
Sodium: 136 mmol/L (ref 135–145)
Total Bilirubin: 1.9 mg/dL — ABNORMAL HIGH (ref 0.3–1.2)
Total Protein: 3.5 g/dL — ABNORMAL LOW (ref 6.5–8.1)

## 2019-02-19 LAB — CBC WITH DIFFERENTIAL/PLATELET
Abs Immature Granulocytes: 1.41 10*3/uL — ABNORMAL HIGH (ref 0.00–0.07)
Basophils Absolute: 0.1 10*3/uL (ref 0.0–0.1)
Basophils Relative: 1 %
Eosinophils Absolute: 0 10*3/uL (ref 0.0–0.5)
Eosinophils Relative: 0 %
HCT: 22 % — ABNORMAL LOW (ref 36.0–46.0)
Hemoglobin: 7.1 g/dL — ABNORMAL LOW (ref 12.0–15.0)
Immature Granulocytes: 16 %
Lymphocytes Relative: 15 %
Lymphs Abs: 1.3 10*3/uL (ref 0.7–4.0)
MCH: 25.6 pg — ABNORMAL LOW (ref 26.0–34.0)
MCHC: 32.3 g/dL (ref 30.0–36.0)
MCV: 79.4 fL — ABNORMAL LOW (ref 80.0–100.0)
Monocytes Absolute: 0.3 10*3/uL (ref 0.1–1.0)
Monocytes Relative: 3 %
Neutro Abs: 5.8 10*3/uL (ref 1.7–7.7)
Neutrophils Relative %: 65 %
Platelets: 30 10*3/uL — ABNORMAL LOW (ref 150–400)
RBC: 2.77 MIL/uL — ABNORMAL LOW (ref 3.87–5.11)
RDW: 20.4 % — ABNORMAL HIGH (ref 11.5–15.5)
WBC: 9 10*3/uL (ref 4.0–10.5)
nRBC: 30 % — ABNORMAL HIGH (ref 0.0–0.2)

## 2019-02-19 LAB — C-REACTIVE PROTEIN: CRP: 20.6 mg/dL — ABNORMAL HIGH (ref <1.0)

## 2019-02-19 LAB — PHOSPHORUS: Phosphorus: 6.1 mg/dL — ABNORMAL HIGH (ref 2.5–4.6)

## 2019-02-19 LAB — FIBRIN DERIVATIVES D-DIMER (ARMC ONLY): Fibrin derivatives D-dimer (ARMC): >7500 ng/mL (FEU) — ABNORMAL HIGH (ref 0.00–499.00)

## 2019-02-19 LAB — MAGNESIUM: Magnesium: 2 mg/dL (ref 1.7–2.4)

## 2019-02-19 MED ORDER — SODIUM CHLORIDE 0.9 % IV SOLN: 2.0000 g | INTRAVENOUS | Status: DC

## 2019-02-19 MED ORDER — SODIUM BICARBONATE-DEXTROSE 150-5 MEQ/L-% IV SOLN
150.0000 meq | INTRAVENOUS | Status: DC
  Filled 2019-02-19 (×2): qty 1000

## 2019-02-19 MED ORDER — SODIUM CHLORIDE 0.9 % IV SOLN
80.0000 mg | Freq: Once | INTRAVENOUS | Status: AC
  Administered 2019-02-19: 80 mg via INTRAVENOUS
  Filled 2019-02-19: qty 80

## 2019-02-19 MED ORDER — LORAZEPAM 2 MG/ML IJ SOLN: 4.0000 mg | Freq: Once | INTRAMUSCULAR | Status: AC

## 2019-02-19 MED ORDER — LORAZEPAM 2 MG/ML IJ SOLN
INTRAMUSCULAR | Status: AC
  Administered 2019-02-19: 4 mg via INTRAVENOUS
  Filled 2019-02-19: qty 2

## 2019-02-19 MED ORDER — SODIUM CHLORIDE 0.9 % IV SOLN
8.0000 mg/h | INTRAVENOUS | Status: DC
  Administered 2019-02-19: 8 mg/h via INTRAVENOUS
  Filled 2019-02-19: qty 80

## 2019-02-19 MED ORDER — VECURONIUM BROMIDE 10 MG IV SOLR: 10.0000 mg | Freq: Once | INTRAVENOUS | Status: AC

## 2019-02-19 MED ORDER — PANTOPRAZOLE SODIUM 40 MG IV SOLR: 40.0000 mg | Freq: Two times a day (BID) | INTRAVENOUS | Status: DC

## 2019-02-19 MED ORDER — NALOXEGOL OXALATE 12.5 MG PO TABS: 12.5000 mg | ORAL_TABLET | Freq: Every day | ORAL | Status: DC

## 2019-02-19 MED ORDER — VECURONIUM BROMIDE 10 MG IV SOLR
INTRAVENOUS | Status: AC
  Administered 2019-02-19: 10 mg via INTRAVENOUS
  Filled 2019-02-19: qty 10

## 2019-02-19 MED ORDER — NOREPINEPHRINE 16 MG/250ML-% IV SOLN
0.0000 ug/min | INTRAVENOUS | Status: DC
  Administered 2019-02-19: 13:00:00 10 ug/min via INTRAVENOUS
  Filled 2019-02-19: qty 250

## 2019-02-19 MED FILL — Phenylephrine HCl IV Soln 10 MG/ML: INTRAVENOUS | Qty: 100 | Status: AC

## 2019-02-19 MED FILL — Sodium Chloride IV Soln 0.9%: INTRAVENOUS | Qty: 250 | Status: AC

## 2019-02-19 NOTE — Progress Notes (Signed)
CRITICAL CARE NOTE  HISTORY OF PRESENT ILLNESS: 33 y.o.femalewith medical history significant ofstage IV triple negative right breast cancer with bone, liver, lungmetastases, currently undergoing chemotherapy -who presents to the hospital with shortness of breath and generalized weakness -Patient came to the hospital 6 days ago on 02/10/2019 with fatigue, weakness, and she was diagnosed with SARS-CoV-2 She was planned to [facility] to receive outpatient infusion for remdesivir, however the nursing coordinator was unable to reach her as per chart and notes.   During that ED visit she underwent a CT angiogram which was negative for PE but did show diffuse groundglass infiltrates  Patient with severe acidosis and encephalopathy   ED Course:In the emergency room she is afebrile, normotensive, and she is tachycardic with rates between 130 and 170s. She is requiring 2 L nasal cannula. Of note, during her previous ED visit her heart rate was 130. CMP is pending but CBC shows a white count of 18.4, hemoglobin of 9.8 and platelets of 121. Chest x-ray showed bilateral infiltrates.  I emergently intubated the patient and placed CVL I attempted to call mother several times but no answer.   Patient is critically ill and dying   CC  follow up respiratory failure  SUBJECTIVE Patient remains critically ill Prognosis is guarded PROGRESSIVE RENAL FAILURE AND PROGRESSIVE LIVER FAILURE SEVERE SHOCK AND SEVERE HYPOXIA AND RESP FAUILURE  PATIENT IS SUFFERING AND DYING PROCESS   BP 105/81   Pulse 95   Temp (!) 94.8 F (34.9 C)   Resp (!) 24   Ht 5\' 5"  (1.651 m)   Wt 48.1 kg   SpO2 94%   BMI 17.65 kg/m    I/O last 3 completed shifts: In: 8950.6 [I.V.:4998.4; IV Piggyback:3952.3] Out: 985 [Urine:415; Emesis/NG output:550; Drains:20] No intake/output data recorded.  SpO2: 94 % O2 Flow Rate (L/min): 2 L/min FiO2 (%): 45 %   SIGNIFICANT EVENTS 12/14 INTUBATED FOR SEVERE  MULTIORGAN FAILURE 12/15 SEVERE RESP FAILURE AND SHOCK 12/16 LIVER AND RENAL FAILURE  REVIEW OF SYSTEMS  PATIENT IS UNABLE TO PROVIDE COMPLETE REVIEW OF SYSTEMS DUE TO SEVERE CRITICAL ILLNESS   COVID-19 DISASTER DECLARATION:   FULL CONTACT PHYSICAL EXAMINATION WAS NOT POSSIBLE DUE TO TREATMENT OF COVID-19 AND   CONSERVATION OF PERSONAL PROTECTIVE EQUIPMENT, LIMITED EXAM FINDINGS INCLUDE-   Patient assessed or the symptoms described in the history of present illness.   In the context of the Global COVID-19 pandemic, which necessitated consideration that the patient might be at risk for infection with the SARS-CoV-2 virus that causes COVID-19, Institutional protocols and algorithms that pertain to the evaluation of patients at risk for COVID-19 are in a state of rapid change based on information released by regulatory bodies including the CDC and federal and state organizations. These policies and algorithms were followed during the patient's care while in hospital.    MEDICATIONS: I have reviewed all medications and confirmed regimen as documented   CULTURE RESULTS   Recent Results (from the past 240 hour(s))  Blood Culture (routine x 2)     Status: None (Preliminary result)   Collection Time: 02/21/2019  9:47 AM   Specimen: BLOOD  Result Value Ref Range Status   Specimen Description BLOOD RIGHT ANTECUBITAL  Final   Special Requests   Final    BOTTLES DRAWN AEROBIC AND ANAEROBIC Blood Culture adequate volume   Culture   Final    NO GROWTH 3 DAYS Performed at [facility], [address], [place], [place] [address]  Report Status PENDING  Incomplete  Blood Culture (routine x 2)     Status: None (Preliminary result)   Collection Time: 02/10/2019  9:47 AM   Specimen: BLOOD  Result Value Ref Range Status   Specimen Description BLOOD LAC  Final   Special Requests   Final    BOTTLES DRAWN AEROBIC AND ANAEROBIC Blood Culture adequate volume   Culture   Final    NO GROWTH  3 DAYS Performed at [facility], [address] [place], [place] [address]    Report Status PENDING  Incomplete  Wound or Superficial Culture     Status: None   Collection Time: 02/07/2019 10:19 AM   Specimen: Chest; Wound  Result Value Ref Range Status   Specimen Description   Final    CHEST Performed at [facility], [address] [place], [place] [address]    Special Requests   Final    Normal Performed at [facility], [address] [place], [place] [address]    Gram Stain   Final    NO WBC SEEN MODERATE GRAM POSITIVE COCCI IN PAIRS Performed at [facility], [place] [address] [place], [place] [address]    Culture ABUNDANT STAPHYLOCOCCUS AUREUS  Final   Report Status 02/18/2019 FINAL  Final   Organism ID, Bacteria STAPHYLOCOCCUS AUREUS  Final      Susceptibility   Staphylococcus aureus - MIC*    CIPROFLOXACIN >=8 RESISTANT Resistant     ERYTHROMYCIN <=0.25 SENSITIVE Sensitive     GENTAMICIN <=0.5 SENSITIVE Sensitive     OXACILLIN <=0.25 SENSITIVE Sensitive     TETRACYCLINE <=1 SENSITIVE Sensitive     VANCOMYCIN <=0.5 SENSITIVE Sensitive     TRIMETH/SULFA <=10 SENSITIVE Sensitive     CLINDAMYCIN <=0.25 SENSITIVE Sensitive     RIFAMPIN <=0.5 SENSITIVE Sensitive     Inducible Clindamycin NEGATIVE Sensitive     * ABUNDANT STAPHYLOCOCCUS AUREUS  Urine Culture     Status: Abnormal   Collection Time: 02/17/19 10:12 AM   Specimen: Urine, Random  Result Value Ref Range Status   Specimen Description   Final    URINE, RANDOM Performed at [facility], [address], [place], [place] [address]    Special Requests   Final    NONE Performed at [facility], [address] [place], [place] [address]    Culture (A)  Final    <10,000 COLONIES/mL INSIGNIFICANT GROWTH Performed at [facility], [place] [address] [place], [place] [address]    Report Status 02/18/2019 FINAL  Final  MRSA PCR Screening     Status: None    Collection Time: 02/17/19  2:40 PM   Specimen: Nasal Mucosa; Nasopharyngeal  Result Value Ref Range Status   MRSA by PCR NEGATIVE NEGATIVE Final    Comment:        The GeneXpert MRSA Assay (FDA approved for NASAL specimens only), is one component of a comprehensive MRSA colonization surveillance program. It is not intended to diagnose MRSA infection nor to guide or monitor treatment for MRSA infections. Performed at [facility], [address], [place],  [address]          Indwelling Urinary Catheter continued, requirement due to   Reason to continue Indwelling Urinary Catheter strict Intake/Output monitoring for hemodynamic instability   Central Line/ continued, requirement due to  Reason to continue [place] of central venous pressure or other hemodynamic parameters and poor IV access  Ventilator continued, requirement due to severe respiratory failure   Ventilator Sedation RASS 0 to -2      ASSESSMENT AND PLAN SYNOPSIS  33 YO FEMALE WITH WITH METASTATIC BREAST CANCER WITH MULTIORGAN FAILURE AND SEPTIC SHOCK WITH GIB, LIVER FAILURE AND RENAL FAILURE WITH COVID 19 INFECTION/PNEUMONIA  Severe ACUTE Hypoxic and Hypercapnic Respiratory Failure -continue Full MV support -continue Bronchodilator Therapy -Wean Fio2 and PEEP as tolerated  COVID 19 INFECTION    ACUTE KIDNEY INJURY/Renal Failure -follow chem 7 -follow UO -continue Foley Catheter-assess need -Avoid nephrotoxic agents -Recheck creatinine    NEUROLOGY - intubated and sedated - minimal sedation to achieve a RASS goal: -1   SHOCK-SEPSIS/HYPOVOLUMIC/CARDIOGENIC -use vasopressors to keep MAP>65 -follow ABG and LA -follow up cultures -emperic ABX -stress dose steroids  CARDIAC ICU monitoring  ID -continue IV abx as prescibed -follow up cultures  GI GI PROPHYLAXIS as indicated  NUTRITIONAL STATUS DIET-->NPO Constipation protocol as indicated  ENDO -  will use ICU hypoglycemic\Hyperglycemia protocol if indicated   ELECTROLYTES -follow labs as needed -replace as needed -pharmacy consultation and following   DVT/GI PRX ordered TRANSFUSIONS AS NEEDED MONITOR FSBS ASSESS the need for LABS as needed   Critical Care Time devoted to patient care services described in this note is 43 minutes.   Overall, patient is critically ill, prognosis is guarded.  Patient with Multiorgan failure and at high risk for cardiac arrest and death.   PATIENT IS DYING  AND WILL NOT SURVIVE THIS ADMISSION SHE IS DNR STATUS RECOMMEND COMFORT CARE MEASURES  Sourish Allender Patricia Pesa, M.D.  [facility]  Medical Director [place] Director [facility] Cardio-Pulmonary Department

## 2019-02-19 NOTE — Progress Notes (Signed)
Pharmacy Antibiotic Note  [name] is a 33 y.o. female admitted on 02/18/2019 with pneumonia and oozing from mastectomy. Patient with stage IV breast cancer with mestatases to bone, liver, and lung. Patient requiring multiple pressors and is in multiorgan failure. Pharmacy has been consulted for cefepime dosing.  Plan: Cefepime 2g IV Q24hr.   MSSA in wound. Vancomycin discontinued.   Remdesivir discontinued secondary to elevated liver enzymes.   Height: 5\' 5"  (165.1 cm) Weight: 106 lb 0.7 oz (48.1 kg) IBW/kg (Calculated) : 57  Temp (24hrs), Avg:96 F (35.6 C), Min:94.8 F (34.9 C), Max:98.2 F (36.8 C)  Recent Labs  Lab 02/27/2019 1004 02/15/2019 1449 02/15/2019 1737 02/17/19 0156 02/17/19 0450 02/17/19 1430 02/18/19 0421 02/18/19 1621 03/03/2019 0301  WBC 18.4*  --   --   --  14.6*  --  12.7*  --  9.0  CREATININE 0.66  --   --   --  0.46 0.65 1.09*  --  2.07*  LATICACIDVEN  --  >11.0* >11.0* >11.0* >11.0*  --   --  >11.0*  --     Estimated Creatinine Clearance: 29.1 mL/min (A) (by C-G formula based on SCr of 2.07 mg/dL (H)).    No Known Allergies  Antimicrobials this admission: Cefepime 12/13 >> Remdesivir 12/13 >> 12/15; dose not administered on 12/16  Vancomycin 12/14 >>  Metronidazole 12/15 >>   Dose adjustments this admission: N/A  Microbiology results: 12/13 BCx: no growth x 3 days  12/13 WCx: MSSA  12/14 UCx: < 10K Insignificant Growth 12/14 MRSA PCR: negative   Thank you for allowing pharmacy to be a part of this patient's care.  Kylyn Mcdade L 02/18/2019 4:31 PM

## 2019-02-19 NOTE — Progress Notes (Signed)
[place]  [phone] [phone]   Name: [name] Date: 03/01/2019 MRN: [id number]  DOB: 1985/11/23  Patient Care Team: Bunnie Pion, FNP as PCP - General (Family Medicine) Rico Junker, RN as Registered Nurse Theodore Demark, RN as Registered Nurse    REASON FOR CONSULTATION: [name] is a 33 y.o. 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  Shewas hospitalized again11/24/2020-11/25/2020with intractable back pain. MRI of the lumbar spine revealed metastatic lesions of T12 and L1. Patient was also treated for opioid-induced constipation. Patient presented to the ER on 02/10/2019 with pain and shortness of breath after running out of her pain medications. She was diagnosed with COVID-19.   Per records, hospitalization was discussed but patient declined due to childcare.  Follow-up telephone visit on 02/11/2019 with referral made to the outpatient treatment facility at [facility] for bamlanivimab.  Patient was also started on oral dexamethasone.  Unfortunately, it does not appear that patient ever went to the infusion center for treatment.  She was readmitted on 02/21/2019 with acute respiratory failure.  CTA of the chest was negative for PE but showed diffuse groundglass infiltrates consistent with multifocal pneumonia from COVID-19.  Lactic acid greater than 11.  Unfortunately, patient later decompensated shortly after admission requiring intubation. Palliative care was consulted to help address goals and manage ongoing symptoms.   CODE STATUS: DNR  PAST MEDICAL HISTORY: Past Medical History:  Diagnosis Date  . Anemia   . Breast cancer ([place])     PAST SURGICAL HISTORY:  Past Surgical History:  Procedure Laterality Date  .  BREAST BIOPSY Right 10/25/2018   Heart Clip, Pending path  . BREAST BIOPSY Right 10/25/2018   Lymph node biopsy, Hydromarker (butterfly), path pending  . CESAREAN SECTION    . CESAREAN SECTION N/A 04/24/2016   Procedure: REPEAT CESAREAN SECTION WITH BILATERAL TUBALIGATION;  Surgeon: Brayton Mars, MD;  Location: [facility] ORS;  Service: Obstetrics;  Laterality: N/A;  Baby Boy born @ 32 Apgars:8/9 Weight: 8lb 9oz  . PORTACATH PLACEMENT Left 11/05/2018   Procedure: INSERTION PORT-A-CATH;  Surgeon: Jules Husbands, MD;  Location: [facility] ORS;  Service: General;  Laterality: Left;  . PRIMARY CLOSURE Right 01/23/2019   Procedure: CLOSURE OF RIGHT BREAST;  Surgeon: Wallace Going, DO;  Location: [facility] ORS;  Service: Plastics;  Laterality: Right;  . SIMPLE MASTECTOMY WITH AXILLARY SENTINEL NODE BIOPSY Right 01/23/2019   Procedure: SIMPLE MASTECTOMY;  Surgeon: Jules Husbands, MD;  Location: [facility] ORS;  Service: General;  Laterality: Right;  combined surgery with Dr Baltazar Apo    HEMATOLOGY/ONCOLOGY HISTORY:  Oncology History  Breast cancer ([place])  11/05/2018 Cancer Staging   Staging form: Breast, AJCC 8th Edition - Clinical stage from 11/05/2018: Stage IV (cT4, cN3, cM1, G3, ER-, PR-, HER2-) - Signed by Sindy Guadeloupe, MD on 11/08/2018   11/07/2018 Initial Diagnosis   Breast cancer ([place])   11/12/2018 -  Chemotherapy   The patient had DOXOrubicin (ADRIAMYCIN) chemo injection 90 mg, 60 mg/m2 = 90 mg, Intravenous,  Once, 2 of 4 cycles Administration: 90 mg (11/12/2018), 90 mg (12/03/2018) palonosetron (ALOXI) injection 0.25 mg, 0.25 mg, Intravenous,  Once, 2 of 4 cycles Administration: 0.25 mg (11/12/2018), 0.25 mg (12/03/2018) pegfilgrastim-jmdb (FULPHILA) injection 6 mg, 6 mg, Subcutaneous,  Once, 2 of 4 cycles Administration: 6  mg (11/13/2018), 6 mg (12/04/2018) cyclophosphamide (CYTOXAN) 900 mg in sodium chloride 0.9 % 250 mL chemo infusion, 600 mg/m2 = 900 mg, Intravenous,  Once, 2 of 4  cycles Administration: 900 mg (11/12/2018), 900 mg (12/03/2018) fosaprepitant (EMEND) 150 mg, dexamethasone (DECADRON) 12 mg in sodium chloride 0.9 % 145 mL IVPB, , Intravenous,  Once, 2 of 4 cycles Administration:  (11/12/2018),  (12/03/2018)  for chemotherapy treatment.      ALLERGIES:  has No Known Allergies.  MEDICATIONS:  Current Facility-Administered Medications  Medication Dose Route Frequency Provider Last Rate Last Admin  . acetaminophen (TYLENOL) tablet 650 mg  650 mg Oral Q6H PRN Caren Griffins, MD   650 mg at 02/18/19 4098  . anidulafungin (ERAXIS) 100 mg in sodium chloride 0.9 % 100 mL IVPB  100 mg Intravenous Daily Flora Lipps, MD 78 mL/hr at 02/04/2019 0932 100 mg at 02/06/2019 0932  . ascorbic acid (VITAMIN C) tablet 500 mg  500 mg Per Tube BID Flora Lipps, MD   500 mg at 02/06/2019 0859  . ceFEPIme (MAXIPIME) 2 g in sodium chloride 0.9 % 100 mL IVPB  2 g Intravenous Q8H Rocky Morel, [facility]   Stopped at 02/28/2019 1191  . chlorhexidine gluconate (MEDLINE KIT) (PERIDEX) 0.12 % solution 15 mL  15 mL Mouth Rinse BID Awilda Bill, NP   15 mL at 02/09/2019 0859  . Chlorhexidine Gluconate Cloth 2 % PADS 6 each  6 each Topical Daily Flora Lipps, MD   6 each at 02/25/2019 0908  . fentaNYL 2548mg in NS 2585m(1024mml) infusion-PREMIX  0-400 mcg/hr Intravenous Continuous KasFlora LippsD 10 mL/hr at 02/06/2019 0635 100 mcg/hr at 02/27/2019 0635  . hydrocortisone sodium succinate (SOLU-CORTEF) 100 MG injection 50 mg  50 mg Intravenous Q6H KasFlora LippsD   50 mg at 03/05/2019 1137  . ibuprofen (ADVIL) tablet 800 mg  800 mg Oral TID PRN KasFlora LippsD   800 mg at 02/18/19 0942  . MEDLINE mouth rinse  15 mL Mouth Rinse 10 times per day BlaAwilda BillP   15 mL at 02/07/2019 1135  . Melatonin TABS 5 mg  5 mg Oral Q24H Kasa, Kurian, MD      . metoprolol tartrate (LOPRESSOR) injection 2.5 mg  2.5 mg Intravenous Q6H PRN BlaAwilda BillP   2.5 mg at 02/18/19 0810  . metroNIDAZOLE (FLAGYL) IVPB  500 mg  500 mg Intravenous Q8H Kasa, KurMaretta BeesD 100 mL/hr at 02/13/2019 1135 500 mg at 02/07/2019 1135  . midazolam (VERSED) injection 2 mg  2 mg Intravenous Q1H PRN KasFlora LippsD   2 mg at 02/18/19 0624782 norepinephrine (LEVOPHED) 16 mg in 250m45memix infusion  0-40 mcg/min Intravenous Titrated KasaFlora Lipps   Stopped at 02/10/2019 1123  . pantoprazole (PROTONIX) injection 40 mg  40 mg Intravenous BID KasaFlora Lipps   40 mg at 02/27/2019 0859  . phenylephrine CONCENTRATED 100mg89msodium chloride 0.9% 250mL 35mmg/mL42mnfusion  0-400 mcg/min Intravenous Titrated BlakeneAwilda Bill mL/hr at 02/22/2019 1229 400 mcg/min at 03/04/2019 1229  . polyethylene glycol (MIRALAX / GLYCOLAX) packet 17 g  17 g Per Tube Daily Kasa, KFlora Lipps17 g at 02/08/2019 0928  . propofol (DIPRIVAN) 1000 MG/100ML infusion  5-80 mcg/kg/min Intravenous Titrated Kasa, KFlora LippsStopped at 02/17/19 1753  . sodium bicarbonate 150 mEq in dextrose 5% 1000 mL infusion  150 mEq Intravenous Continuous Keene, Bradly Bienenstock   .  vasopressin (PITRESSIN) 40 Units in sodium chloride 0.9 % 250 mL (0.16 Units/mL) infusion  0.03 Units/min Intravenous Continuous Kasa, Kurian, MD 11.25 mL/hr at 02/28/2019 1134 0.03 Units/min at 02/17/2019 1134  . zinc sulfate capsule 220 mg  220 mg Per Tube Daily Flora Lipps, MD   220 mg at 02/10/2019 0928    VITAL SIGNS: BP 105/81   Pulse 95   Temp (!) 94.8 F (34.9 C)   Resp (!) 24   Ht _0  (1.651 m)   Wt 106 lb 0.7 oz (48.1 kg)   SpO2 91%   BMI 17.65 kg/m  Filed Weights   03/05/2019 0936  Weight: 106 lb 0.7 oz (48.1 kg)    Estimated body mass index is 17.65 kg/m as calculated from the following:   Height as of this encounter: _1  (1.651 m).   Weight as of this encounter: 106 lb 0.7 oz (48.1 kg).  LABS: CBC:    Component Value Date/Time   WBC 9.0 03/05/2019 0301   HGB 7.1 (L) 02/26/2019 0301   HCT 22.0 (L) 02/23/2019 0301   PLT 30 (L) 02/26/2019 0301   MCV 79.4 (L) 03/06/2019  0301   NEUTROABS 5.8 02/13/2019 0301   LYMPHSABS 1.3 03/04/2019 0301   MONOABS 0.3 02/22/2019 0301   EOSABS 0.0 02/14/2019 0301   BASOSABS 0.1 03/02/2019 0301   Comprehensive Metabolic Panel:    Component Value Date/Time   NA 136 02/27/2019 0301   K 5.3 (H) 02/13/2019 0301   CL 94 (L) 02/06/2019 0301   CO2 13 (L) 02/28/2019 0301   BUN 41 (H) 02/06/2019 0301   CREATININE 2.07 (H) 02/22/2019 0301   GLUCOSE 196 (H) 02/05/2019 0301   CALCIUM 7.1 (L) 03/04/2019 0301   AST 4,019 (H) 02/10/2019 0301   ALT 383 (H) 02/13/2019 0301   ALKPHOS 2,215 (H) 03/01/2019 0301   BILITOT 1.9 (H) 02/22/2019 0301   PROT 3.5 (L) 02/04/2019 0301   ALBUMIN 1.2 (L) 02/23/2019 0301    RADIOGRAPHIC STUDIES: DG Chest 2 View  Result Date: 01/27/2019 CLINICAL DATA:  Right-sided chest pain status post mastectomy EXAM: CHEST - 2 VIEW COMPARISON:  11/05/2018 FINDINGS: Left-sided central venous catheter with tip over the proximal right atrium. Drainage catheter over the right inframammary region. Small right greater than left pleural effusion. Mild airspace disease at the right base. Bilateral hilar fullness, possible nodes. Perihilar interstitial opacity. No pneumothorax. IMPRESSION: 1. Small bilateral pleural effusions, right greater than left with atelectasis or mild infiltrate at the right base. 2. Increased perihilar interstitial opacity which may reflect atypical infection, less likely edema, or possible interstitial spread of disease. 3. Bilateral hilar fullness, cannot exclude hilar nodes. Electronically Signed   By: Donavan Foil M.D.   On: 01/27/2019 22:15   CT Angio Chest PE W/Cm &/Or Wo Cm  Result Date: 02/10/2019 CLINICAL DATA:  History of breast cancer. Shortness of breath for 2 days. Patient status post right mastectomy 01/23/2019 EXAM: CT ANGIOGRAPHY CHEST CT ABDOMEN AND PELVIS WITH CONTRAST TECHNIQUE: Multidetector CT imaging of the chest was performed using the standard protocol during bolus  administration of intravenous contrast. Multiplanar CT image reconstructions and MIPs were obtained to evaluate the vascular anatomy. Multidetector CT imaging of the abdomen and pelvis was performed using the standard protocol during bolus administration of intravenous contrast. CONTRAST:  75 mL OMNIPAQUE IOHEXOL 350 MG/ML SOLN COMPARISON:  CT angiogram of the chest 01/28/2019. CT abdomen and pelvis 01/18/2019. FINDINGS: CTA CHEST FINDINGS Cardiovascular: No pulmonary embolus is  seen. Heart size is normal. No pericardial effusion. Mediastinum/Nodes: Mediastinal and hilar lymphadenopathy is again seen. 1.6 cm node in the right hilum on image 45 is identified. An AP window node on image 38 measures 1.1 cm short axis dimension. The appearance is unchanged. The esophagus is unremarkable. The thyroid gland appears normal. Lungs/Pleura: Moderately large right pleural effusion has slightly increased since the prior CT. Scattered areas of ground-glass attenuation throughout both lungs are new since the prior CT. Multiple pulmonary nodules are again identified. A left lower lobe nodule measuring 0.8 cm on image 67 measured 0.6 cm on the prior examination. A 0.6 cm left lower lobe nodule on the prior examination measures 0.7 cm on image 62, series 4 today. A 0.5 cm left upper lobe nodule on image 33 is new since the prior CT. Compressive atelectasis on the right is noted. Musculoskeletal: Soft tissue gas in the right chest wall from prior mastectomy has resolved since the prior study. There is soft tissue edema and stranding in the right chest wall likely due to postoperative change. A drain is in place. Multiple sclerotic lesions are seen in scattered bones are consistent with metastatic disease. Review of the MIP images confirms the above findings. CT ABDOMEN and PELVIS FINDINGS Hepatobiliary: Again seen are multiple metastatic deposits in the liver including a 2.3 cm lesion in the posterior right hepatic lobe on image 18  which measured 2.1 cm on the prior exam. The gallbladder is mildly distended. Biliary tree is unremarkable. Pancreas: Unremarkable. No pancreatic ductal dilatation or surrounding inflammatory changes. Spleen: Normal in size without focal abnormality. Adrenals/Urinary Tract: Adrenal glands are unremarkable. Kidneys are normal, without renal calculi, focal lesion, or hydronephrosis. Bladder is unremarkable. Stomach/Bowel: Stomach is within normal limits. Appendix appears normal. No evidence of bowel wall thickening, distention, or inflammatory changes. Vascular/Lymphatic: No significant vascular findings are present. No enlarged abdominal or pelvic lymph nodes. Reproductive: Uterus and bilateral adnexa are unremarkable. Other: There is a small volume of free pelvic fluid which is new since the prior exam. Musculoskeletal: Multiple sclerotic bone lesions consistent with metastatic disease are again seen. Review of the MIP images confirms the above findings. IMPRESSION: Negative for pulmonary embolus. Scattered ground-glass attenuation in both lungs is worrisome for pneumonia, including atypical/viral infection. Moderately large right pleural effusion has increased since the prior chest CT. New pulmonary nodules and increase in the size previously seen nodules consistent with progressive metastatic disease. Additional metastatic disease with lymphadenopathy in the chest, liver lesions and skeletal lesions noted. Small volume of free pelvic fluid is new since the prior abdomen and pelvis CT scan and may be due to volume overload. Postoperative change right chest wall. Surgical drain remains in place. Electronically Signed   By: Inge Rise M.D.   On: 02/10/2019 12:27   CT Angio Chest PE W and/or Wo Contrast  Result Date: 01/28/2019 CLINICAL DATA:  Right upper back and rib pain, increasing drainage from mastectomy site, procedure performed 01/23/2019 EXAM: CT ANGIOGRAPHY CHEST WITH CONTRAST TECHNIQUE:  Multidetector CT imaging of the chest was performed using the standard protocol during bolus administration of intravenous contrast. Multiplanar CT image reconstructions and MIPs were obtained to evaluate the vascular anatomy. CONTRAST:  17m OMNIPAQUE IOHEXOL 350 MG/ML SOLN COMPARISON:  PET CT November 04, 2018 FINDINGS: Cardiovascular: Satisfactory opacification the pulmonary arteries to the segmental level. No pulmonary artery filling defects are identified. Central pulmonary arteries are normal caliber. No elevation of the RV/LV ratio (0.7). Left subclavian approach Port-A-Cath tip terminates  at the cavoatrial junction. Normal cardiac size. Trace pericardial fluid. Thoracic aorta is normal caliber. Normal 3 vessel branching of the aortic arch. No luminal irregularity is seen. Mediastinum/Nodes: There is increasing bulky mediastinal, hilar and axillary adenopathy, much of which was previously FDG avid on comparison PET-CT. Reference lymph nodes include a right axillary lymph node measuring 18 mm, previously 14 mm (4/34). A 13 mm left axillary lymph node not clearly evident on comparison study (4/29). A 15 mm right hilar lymph node, measuring approximately 12 mm previously (4/44). And a 12 mm AP window lymph node, not clearly present on comparison (4/37). No acute abnormality of the trachea or esophagus. Lungs/Pleura: There is a moderate right pleural effusion and trace left effusion. No abnormal pleural thickening or enhancement adjacent areas of passive atelectasis are noted. Some interlobular septal thickening is present as well. There are several scattered subpleural nodules, largest measuring up to 6 mm in size (6/60). Upper Abdomen: Numerous hypoattenuating nodules throughout the liver, not clearly evident on comparison PET-CT. Musculoskeletal: There are postsurgical changes from recent right mastectomy and right axillary nodal dissection with surgical clips. There is soft tissue gas and stranding adjacent the  right pectoralis musculature with a a surgical soft tissue drain coursing from the right chest wall and terminating just to the left of midline. Axillary fluid in stranding is noted as well (4/49). There is some abnormal hypoattenuation in the right pectoralis musculature (4/56) nonspecific given recent postoperative state but an intramuscular hematoma, abscess or breast mass is not excluded. Review of the MIP images confirms the above findings. IMPRESSION: 1. No evidence of pulmonary embolism. 2. Postsurgical changes from recent right mastectomy and right axillary nodal dissection with soft tissue gas and stranding adjacent to the right pectoralis musculature with a surgical soft tissue drain coursing from the right chest wall and terminating just to the left of midline. There is some abnormal hypoattenuation in the medial right pectoralis musculature, nonspecific given recent postoperative state and proximity to the primary breast lesion, but an intramuscular hematoma, abscess or breast mass is not excluded. 3. Numerous hypoattenuating nodules throughout the liver, not clearly evident on comparison PET-CT, concerning for metastatic disease. 4. Increased bulky mediastinal, hilar and axillary adenopathy, much of which was previously FDG avid on comparison PET-CT, concerning for metastatic disease. 5. Few rounded subpleural nodule suspicious for further metastatic disease are present as well. 6. The moderate right and trace left pleural effusions. Malignant effusion is highly likely though no pleural thickening or enhancement is seen. These results were called by telephone at the time of interpretation on 01/28/2019 at 12:36 am to provider Dr Alfred Levins , who verbally acknowledged these results. Electronically Signed   By: Lovena Le M.D.   On: 01/28/2019 00:37   MR LUMBAR SPINE WO CONTRAST  Result Date: 01/28/2019 CLINICAL DATA:  Back pain, greater than 6 weeks conservative treatment, persistent symptoms  additional history provided: 33 year old female with known history of metastatic breast cancer status post chemotherapy. Acute onset intractable left upper back pain EXAM: MRI LUMBAR SPINE WITHOUT CONTRAST TECHNIQUE: Multiplanar, multisequence MR imaging of the lumbar spine was performed. No intravenous contrast was administered. COMPARISON:  CT abdomen/pelvis 01/18/2019. FINDINGS: Multiple sequences are mildly motion degraded. Segmentation:  5 lumbar vertebrae Alignment: Straightening of the expected lumbar lordosis. No significant spondylolisthesis. Vertebrae: There is diffuse abnormal T1 hypointense marrow signal. 1.5 cm T2/STIR hyperintense lesion within the T12 vertebral body suspicious for a metastatic lesion. Multifocal sclerotic metastases within the lower thoracic and lumbar  spine as well as bony pelvis were otherwise better appreciated on recent prior CT abdomen/pelvis 01/18/2019. Vertebral body height is maintained. Conus medullaris and cauda equina: Conus extends to the L1 level. No signal abnormality within the visualized distal spinal cord. Paraspinal and other soft tissues: Multiple liver metastases, partially imaged. No other abnormality identified within included portions of the abdomen/retroperitoneum. Paraspinal soft tissues within normal limits. Disc levels: Intervertebral disc height is preserved. There is multilevel mild to moderate facet hypertrophy. Facet hypertrophy results in mild left L4-L5 subarticular narrowing without nerve root impingement. No significant disc herniation, central canal stenosis or neural foraminal narrowing at any level. IMPRESSION: 1.5 cm T12 vertebral body lesion likely reflecting and osseous metastasis. Numerous additional sclerotic metastases within the lower thoracic and lumbar spine as well as bony pelvis were better appreciated on CT abdomen/pelvis 01/18/2019. No compression fracture. Mild lumbar spondylosis predominantly consisting of facet arthropathy. Mild  L4-L5 left subarticular narrowing without nerve root impingement. No significant central canal stenosis or neural foraminal narrowing at any level. Diffuse abnormal T1 hypointense marrow signal which is nonspecific, but likely reflects red marrow reconversion given provided history. Multiple partially imaged liver metastases. Electronically Signed   By: Kellie Simmering DO   On: 01/28/2019 14:40   CT Abdomen Pelvis W Contrast  Result Date: 02/10/2019 CLINICAL DATA:  History of breast cancer. Shortness of breath for 2 days. Patient status post right mastectomy 01/23/2019 EXAM: CT ANGIOGRAPHY CHEST CT ABDOMEN AND PELVIS WITH CONTRAST TECHNIQUE: Multidetector CT imaging of the chest was performed using the standard protocol during bolus administration of intravenous contrast. Multiplanar CT image reconstructions and MIPs were obtained to evaluate the vascular anatomy. Multidetector CT imaging of the abdomen and pelvis was performed using the standard protocol during bolus administration of intravenous contrast. CONTRAST:  75 mL OMNIPAQUE IOHEXOL 350 MG/ML SOLN COMPARISON:  CT angiogram of the chest 01/28/2019. CT abdomen and pelvis 01/18/2019. FINDINGS: CTA CHEST FINDINGS Cardiovascular: No pulmonary embolus is seen. Heart size is normal. No pericardial effusion. Mediastinum/Nodes: Mediastinal and hilar lymphadenopathy is again seen. 1.6 cm node in the right hilum on image 45 is identified. An AP window node on image 38 measures 1.1 cm short axis dimension. The appearance is unchanged. The esophagus is unremarkable. The thyroid gland appears normal. Lungs/Pleura: Moderately large right pleural effusion has slightly increased since the prior CT. Scattered areas of ground-glass attenuation throughout both lungs are new since the prior CT. Multiple pulmonary nodules are again identified. A left lower lobe nodule measuring 0.8 cm on image 67 measured 0.6 cm on the prior examination. A 0.6 cm left lower lobe nodule on the  prior examination measures 0.7 cm on image 62, series 4 today. A 0.5 cm left upper lobe nodule on image 33 is new since the prior CT. Compressive atelectasis on the right is noted. Musculoskeletal: Soft tissue gas in the right chest wall from prior mastectomy has resolved since the prior study. There is soft tissue edema and stranding in the right chest wall likely due to postoperative change. A drain is in place. Multiple sclerotic lesions are seen in scattered bones are consistent with metastatic disease. Review of the MIP images confirms the above findings. CT ABDOMEN and PELVIS FINDINGS Hepatobiliary: Again seen are multiple metastatic deposits in the liver including a 2.3 cm lesion in the posterior right hepatic lobe on image 18 which measured 2.1 cm on the prior exam. The gallbladder is mildly distended. Biliary tree is unremarkable. Pancreas: Unremarkable. No pancreatic ductal dilatation  or surrounding inflammatory changes. Spleen: Normal in size without focal abnormality. Adrenals/Urinary Tract: Adrenal glands are unremarkable. Kidneys are normal, without renal calculi, focal lesion, or hydronephrosis. Bladder is unremarkable. Stomach/Bowel: Stomach is within normal limits. Appendix appears normal. No evidence of bowel wall thickening, distention, or inflammatory changes. Vascular/Lymphatic: No significant vascular findings are present. No enlarged abdominal or pelvic lymph nodes. Reproductive: Uterus and bilateral adnexa are unremarkable. Other: There is a small volume of free pelvic fluid which is new since the prior exam. Musculoskeletal: Multiple sclerotic bone lesions consistent with metastatic disease are again seen. Review of the MIP images confirms the above findings. IMPRESSION: Negative for pulmonary embolus. Scattered ground-glass attenuation in both lungs is worrisome for pneumonia, including atypical/viral infection. Moderately large right pleural effusion has increased since the prior chest CT.  New pulmonary nodules and increase in the size previously seen nodules consistent with progressive metastatic disease. Additional metastatic disease with lymphadenopathy in the chest, liver lesions and skeletal lesions noted. Small volume of free pelvic fluid is new since the prior abdomen and pelvis CT scan and may be due to volume overload. Postoperative change right chest wall. Surgical drain remains in place. Electronically Signed   By: Inge Rise M.D.   On: 02/10/2019 12:27   DG Chest Portable 1 View  Result Date: 02/17/2019 CLINICAL DATA:  Intubation.  Acute respiratory failure. EXAM: PORTABLE CHEST 1 VIEW COMPARISON:  02/17/2019 FINDINGS: The endotracheal tube is 3.3 cm above the carina. The NG tube is coursing down the esophagus and into the stomach. New right IJ central venous catheter tip is in the distal SVC. Stable left-sided Port-A-Cath with its tip in the right atrium. Persistent diffuse interstitial and airspace process in the lungs along with a right-sided pleural effusion. Slight improved lung aeration status post intubation. No pneumothorax. IMPRESSION: 1. The endotracheal tube, NG tube and right IJ central venous catheter are in good position without complicating features. 2. Diffuse interstitial and airspace process and right pleural effusion. Overall slight improved aeration after intubation. Electronically Signed   By: Marijo Sanes M.D.   On: 02/17/2019 12:34   DG Chest Port 1 View  Result Date: 02/17/2019 CLINICAL DATA:  Acute respiratory failure. COVID-19. Difficulty breathing. EXAM: PORTABLE CHEST 1 VIEW COMPARISON:  Radiograph yesterday. CT 02/10/2019 FINDINGS: Right pleural effusion which may have increased from yesterday. Worsening in diffuse patchy and interstitial opacities. Development of mild pulmonary edema with Kerley B-lines. Normal heart size and mediastinal contours. No pneumothorax. Post right mastectomy. Probable drain in place. Surgical clips in the right  axilla. IMPRESSION: 1. Right pleural effusion which is increased from yesterday. 2. Increased interstitial opacities and Kerley B-lines consistent with pulmonary edema. Additional patchy opacities throughout both lungs may represent pneumonia in the setting of COVID-19. Electronically Signed   By: Keith Rake M.D.   On: 02/17/2019 05:05   DG Chest Port 1 View  Result Date: 02/25/2019 CLINICAL DATA:  Shortness of breath. COVID-19 positive. EXAM: PORTABLE CHEST 1 VIEW COMPARISON:  February 10, 2019. FINDINGS: The heart size and mediastinal contours are within normal limits. Left subclavian Port-A-Cath is unchanged in position. No pneumothorax pleural effusion is noted. Stable bilateral diffuse interstitial densities are noted. The visualized skeletal structures are unremarkable. IMPRESSION: Stable bilateral diffuse interstitial densities are noted concerning for edema or inflammation. Electronically Signed   By: Marijo Conception M.D.   On: 02/14/2019 10:56   DG Chest Portable 1 View  Result Date: 02/10/2019 CLINICAL DATA:  Short of breath.  History of breast cancer. EXAM: PORTABLE CHEST 1 VIEW COMPARISON:  Chest x-ray 01/27/2019.  CT chest 01/28/2019 FINDINGS: Heart size normal. Diffusely increased interstitial markings with mild progression since the prior study. Improvement in right pleural effusion. Heart size normal.  Mild mediastinal adenopathy best seen on CT Port-A-Cath tip in the right atrium unchanged. Soft tissue drain in the right breast. IMPRESSION: Diffuse interstitial markings have progressed in the interval and may be due to infection or edema. Improvement in right pleural effusion. Electronically Signed   By: Franchot Gallo M.D.   On: 02/10/2019 08:49    PERFORMANCE STATUS (ECOG) : 4 - Bedbound  Review of Systems Unable to address  Physical Exam General: critically ill appearing Cardiovascular: tachy Pulmonary: unlabored Neurological: sedated  IMPRESSION: Patient remains  critically ill in ICU.  Patient is actively declining.  Patient maxed on pressors.  BP intermittently severely low.  She is requiring more ventilator support today with increased FiO2 from 40 to 60%.  She is still hypoxic with SaO2 in the high 80s.  Patient with acute multiorgan failure (hepatic, renal, cardiac, pulmonary).   I called and spoke with patient's mother and stepfather.  Updated them on her current status.  Both verbalized understanding that patient is unlikely to survive.  Stepfather says that they have picked out a funeral home.  We discussed the visitation policy but family declined to visit at present.  Patient's stepfather, [name] can be reached at [phone] if patient's mother is unavailable.   Case discussed with attending, nursing, and chaplain.  Murphy: -Continue current scope of treatment -DNR -Will follow   Time Total: 30 minutes  Visit consisted of counseling and education dealing with the complex and emotionally intense issues of symptom management and palliative care in the setting of serious and potentially life-threatening illness.Greater than 50%  of this time was spent counseling and coordinating care related to the above assessment and Murphy.  Signed by: Altha Harm, PhD, NP-C

## 2019-02-19 NOTE — Progress Notes (Signed)
Assisted tele visit to patient with family member.  Toribio Seiber Anderson, RN   

## 2019-02-19 NOTE — Progress Notes (Addendum)
Pt became bradycardic and went into PEA. NP notified and family called to bedside. Pt was a DNR and allowed to pass naturally. Pt on three vasopressors at the time and remained on ventilator. Pt pronounced by this RN and Vilinda Boehringer, RN with family at the bedside. Pt with no apical pulse and reflexes present. Time of death 34. CDS notified and Admin coordinator. Family unable to give funeral home arrangements at this time. Will call back with further information soon.

## 2019-02-21 ENCOUNTER — Inpatient Hospital Stay: Payer: Medicaid Other | Admitting: Hospice and Palliative Medicine

## 2019-02-21 LAB — CULTURE, BLOOD (ROUTINE X 2)
Culture: NO GROWTH
Culture: NO GROWTH
Special Requests: ADEQUATE
Special Requests: ADEQUATE

## 2019-02-24 ENCOUNTER — Other Ambulatory Visit: Payer: Medicaid Other

## 2019-02-24 ENCOUNTER — Ambulatory Visit: Payer: Medicaid Other

## 2019-02-24 ENCOUNTER — Ambulatory Visit: Payer: Medicaid Other | Admitting: Oncology

## 2019-03-04 ENCOUNTER — Other Ambulatory Visit: Payer: Medicaid Other

## 2019-03-04 ENCOUNTER — Ambulatory Visit: Payer: Self-pay

## 2019-03-06 ENCOUNTER — Encounter: Payer: Self-pay | Admitting: *Deleted

## 2019-03-07 NOTE — Death Summary Note (Signed)
DEATH SUMMARY   Patient Details  Name: [name] MRN: [id number] DOB: 26-Feb-1986  Admission/Discharge Information   Admit Date:  Feb 28, 2019  Date of Death: Date of Death: 03-03-2019  Time of Death: Time of Death: 06-09-1920  Length of Stay: 3  Referring Physician: Bunnie Pion, FNP   Reason(s) for Hospitalization  COVID-19 virus infection Multifocal Pneumonia Acute Hypoxic Respiratory Failure Septic Shock Acute Kidney Injury Liver Failure  Diagnoses  Preliminary cause of death:  Secondary Diagnoses (including complications and co-morbidities):  Active Problems:   COVID-19 virus infection   Acute respiratory failure (HCC) Multifocal Pneumonia Septic Shock Acute Kidney Injury Liver Failure  Brief Hospital Course (including significant findings, care, treatment, and services provided and events leading to death)  [name] is a 34 y.o. 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 Of note, she came to the hospital 6 days ago on 02/10/2019 with fatigue, weakness, and she was diagnosed with COVID-19.  She refused hospitalization due to childcare issues, therefore she was planned to have arranged to receive outpatient infusion for remdesivir, however the nursing coordinator was unable to reach her as per chart and notes. During that ED visit she underwent a CT angiogram which was negative for PE but did show diffuse groundglass infiltrates.    In the ED this admission  she was afebrile, normotensive, and tachycardic. Workup revealed WBC 18.4, Hemoglobin 9.8, and platelets 121.  Chest x-ray showed bilateral infiltrates.  Lactic acid was found to be greater than 11.0.  Code SEPSIS was initiated, and she subsequently received IV fluids and broad spectrum antibiotics.  She  was placed on IV Remdesivir and IV steroids. While awaiting for ICU bed placement in the ED, she developed acute respiratory distress and encephalopathy, of which she subsequently required intubation.  She was admitted to ICU for further workup and treatment of Acute Hypoxic Respiratory Failure in the setting of COVID-19 infection and Multifocal pneumonia, septic shock, and severe metabolic acidosis.   Her hospital course was complicated by development of progressive renal failure and liver failure. Palliative care was consulted to help address goals of care, of which the patient's mother chose to make the patient a DNR. On Mar 03, 2019 she remained hypotensive despite max dose of 3 vasopressors, and she required more ventilator support with increased FiO2 from 40% to 60%.  Despite her increased vent settings, she remained hypoxic with sat's in the 80's.  Pt's family understood that she would unlikely survive given her acute multiorgan failure.  Late in the evening on 03/03/19, she became bradycardic which progressed into PEA.  Given her DNR status, she was allowed to pass naturally.       Pertinent Labs and Studies  Significant Diagnostic Studies DG Chest 2 View  Result Date: 01/27/2019 CLINICAL DATA:  Right-sided chest pain status post mastectomy EXAM: CHEST - 2 VIEW COMPARISON:  11/05/2018 FINDINGS: Left-sided central venous catheter with tip over the proximal right atrium. Drainage catheter over the right inframammary region. Small right greater than left pleural effusion. Mild airspace disease at the right base. Bilateral hilar fullness, possible nodes. Perihilar interstitial opacity. No pneumothorax. IMPRESSION: 1. Small bilateral pleural effusions, right greater than left with atelectasis or mild infiltrate at the right base. 2. Increased perihilar interstitial opacity which may reflect atypical infection, less likely edema, or possible interstitial spread of  disease. 3. Bilateral hilar fullness,  cannot exclude hilar nodes. Electronically Signed   By: Donavan Foil M.D.   On: 01/27/2019 22:15   CT Angio Chest PE W/Cm &/Or Wo Cm  Result Date: 02/10/2019 CLINICAL DATA:  History of breast cancer. Shortness of breath for 2 days. Patient status post right mastectomy 01/23/2019 EXAM: CT ANGIOGRAPHY CHEST CT ABDOMEN AND PELVIS WITH CONTRAST TECHNIQUE: Multidetector CT imaging of the chest was performed using the standard protocol during bolus administration of intravenous contrast. Multiplanar CT image reconstructions and MIPs were obtained to evaluate the vascular anatomy. Multidetector CT imaging of the abdomen and pelvis was performed using the standard protocol during bolus administration of intravenous contrast. CONTRAST:  75 mL OMNIPAQUE IOHEXOL 350 MG/ML SOLN COMPARISON:  CT angiogram of the chest 01/28/2019. CT abdomen and pelvis 01/18/2019. FINDINGS: CTA CHEST FINDINGS Cardiovascular: No pulmonary embolus is seen. Heart size is normal. No pericardial effusion. Mediastinum/Nodes: Mediastinal and hilar lymphadenopathy is again seen. 1.6 cm node in the right hilum on image 45 is identified. An AP window node on image 38 measures 1.1 cm short axis dimension. The appearance is unchanged. The esophagus is unremarkable. The thyroid gland appears normal. Lungs/Pleura: Moderately large right pleural effusion has slightly increased since the prior CT. Scattered areas of ground-glass attenuation throughout both lungs are new since the prior CT. Multiple pulmonary nodules are again identified. A left lower lobe nodule measuring 0.8 cm on image 67 measured 0.6 cm on the prior examination. A 0.6 cm left lower lobe nodule on the prior examination measures 0.7 cm on image 62, series 4 today. A 0.5 cm left upper lobe nodule on image 33 is new since the prior CT. Compressive atelectasis on the right is noted. Musculoskeletal: Soft tissue gas in the right chest wall from prior mastectomy has resolved since the prior  study. There is soft tissue edema and stranding in the right chest wall likely due to postoperative change. A drain is in place. Multiple sclerotic lesions are seen in scattered bones are consistent with metastatic disease. Review of the MIP images confirms the above findings. CT ABDOMEN and PELVIS FINDINGS Hepatobiliary: Again seen are multiple metastatic deposits in the liver including a 2.3 cm lesion in the posterior right hepatic lobe on image 18 which measured 2.1 cm on the prior exam. The gallbladder is mildly distended. Biliary tree is unremarkable. Pancreas: Unremarkable. No pancreatic ductal dilatation or surrounding inflammatory changes. Spleen: Normal in size without focal abnormality. Adrenals/Urinary Tract: Adrenal glands are unremarkable. Kidneys are normal, without renal calculi, focal lesion, or hydronephrosis. Bladder is unremarkable. Stomach/Bowel: Stomach is within normal limits. Appendix appears normal. No evidence of bowel wall thickening, distention, or inflammatory changes. Vascular/Lymphatic: No significant vascular findings are present. No enlarged abdominal or pelvic lymph nodes. Reproductive: Uterus and bilateral adnexa are unremarkable. Other: There is a small volume of free pelvic fluid which is new since the prior exam. Musculoskeletal: Multiple sclerotic bone lesions consistent with metastatic disease are again seen. Review of the MIP images confirms the above findings. IMPRESSION: Negative for pulmonary embolus. Scattered ground-glass attenuation in both lungs is worrisome for pneumonia, including atypical/viral infection. Moderately large right pleural effusion has increased since the prior chest CT. New pulmonary nodules and increase in the size previously seen nodules consistent with progressive metastatic disease. Additional metastatic disease with lymphadenopathy in the chest, liver lesions and skeletal lesions noted. Small volume of free pelvic fluid is new since the prior  abdomen and pelvis CT scan and may  be due to volume overload. Postoperative change right chest wall. Surgical drain remains in place. Electronically Signed   By: Inge Rise M.D.   On: 02/10/2019 12:27   CT Angio Chest PE W and/or Wo Contrast  Result Date: 01/28/2019 CLINICAL DATA:  Right upper back and rib pain, increasing drainage from mastectomy site, procedure performed 01/23/2019 EXAM: CT ANGIOGRAPHY CHEST WITH CONTRAST TECHNIQUE: Multidetector CT imaging of the chest was performed using the standard protocol during bolus administration of intravenous contrast. Multiplanar CT image reconstructions and MIPs were obtained to evaluate the vascular anatomy. CONTRAST:  25mL OMNIPAQUE IOHEXOL 350 MG/ML SOLN COMPARISON:  PET CT November 04, 2018 FINDINGS: Cardiovascular: Satisfactory opacification the pulmonary arteries to the segmental level. No pulmonary artery filling defects are identified. Central pulmonary arteries are normal caliber. No elevation of the RV/LV ratio (0.7). Left subclavian approach Port-A-Cath tip terminates at the cavoatrial junction. Normal cardiac size. Trace pericardial fluid. Thoracic aorta is normal caliber. Normal 3 vessel branching of the aortic arch. No luminal irregularity is seen. Mediastinum/Nodes: There is increasing bulky mediastinal, hilar and axillary adenopathy, much of which was previously FDG avid on comparison PET-CT. Reference lymph nodes include a right axillary lymph node measuring 18 mm, previously 14 mm (4/34). A 13 mm left axillary lymph node not clearly evident on comparison study (4/29). A 15 mm right hilar lymph node, measuring approximately 12 mm previously (4/44). And a 12 mm AP window lymph node, not clearly present on comparison (4/37). No acute abnormality of the trachea or esophagus. Lungs/Pleura: There is a moderate right pleural effusion and trace left effusion. No abnormal pleural thickening or enhancement adjacent areas of passive atelectasis are  noted. Some interlobular septal thickening is present as well. There are several scattered subpleural nodules, largest measuring up to 6 mm in size (6/60). Upper Abdomen: Numerous hypoattenuating nodules throughout the liver, not clearly evident on comparison PET-CT. Musculoskeletal: There are postsurgical changes from recent right mastectomy and right axillary nodal dissection with surgical clips. There is soft tissue gas and stranding adjacent the right pectoralis musculature with a a surgical soft tissue drain coursing from the right chest wall and terminating just to the left of midline. Axillary fluid in stranding is noted as well (4/49). There is some abnormal hypoattenuation in the right pectoralis musculature (4/56) nonspecific given recent postoperative state but an intramuscular hematoma, abscess or breast mass is not excluded. Review of the MIP images confirms the above findings. IMPRESSION: 1. No evidence of pulmonary embolism. 2. Postsurgical changes from recent right mastectomy and right axillary nodal dissection with soft tissue gas and stranding adjacent to the right pectoralis musculature with a surgical soft tissue drain coursing from the right chest wall and terminating just to the left of midline. There is some abnormal hypoattenuation in the medial right pectoralis musculature, nonspecific given recent postoperative state and proximity to the primary breast lesion, but an intramuscular hematoma, abscess or breast mass is not excluded. 3. Numerous hypoattenuating nodules throughout the liver, not clearly evident on comparison PET-CT, concerning for metastatic disease. 4. Increased bulky mediastinal, hilar and axillary adenopathy, much of which was previously FDG avid on comparison PET-CT, concerning for metastatic disease. 5. Few rounded subpleural nodule suspicious for further metastatic disease are present as well. 6. The moderate right and trace left pleural effusions. Malignant effusion is  highly likely though no pleural thickening or enhancement is seen. These results were called by telephone at the time of interpretation on 01/28/2019 at 12:36 am to provider  Dr Alfred Levins , who verbally acknowledged these results. Electronically Signed   By: Lovena Le M.D.   On: 01/28/2019 00:37   MR LUMBAR SPINE WO CONTRAST  Result Date: 01/28/2019 CLINICAL DATA:  Back pain, greater than 6 weeks conservative treatment, persistent symptoms additional history provided: 34 year old female with known history of metastatic breast cancer status post chemotherapy. Acute onset intractable left upper back pain EXAM: MRI LUMBAR SPINE WITHOUT CONTRAST TECHNIQUE: Multiplanar, multisequence MR imaging of the lumbar spine was performed. No intravenous contrast was administered. COMPARISON:  CT abdomen/pelvis 01/18/2019. FINDINGS: Multiple sequences are mildly motion degraded. Segmentation:  5 lumbar vertebrae Alignment: Straightening of the expected lumbar lordosis. No significant spondylolisthesis. Vertebrae: There is diffuse abnormal T1 hypointense marrow signal. 1.5 cm T2/STIR hyperintense lesion within the T12 vertebral body suspicious for a metastatic lesion. Multifocal sclerotic metastases within the lower thoracic and lumbar spine as well as bony pelvis were otherwise better appreciated on recent prior CT abdomen/pelvis 01/18/2019. Vertebral body height is maintained. Conus medullaris and cauda equina: Conus extends to the L1 level. No signal abnormality within the visualized distal spinal cord. Paraspinal and other soft tissues: Multiple liver metastases, partially imaged. No other abnormality identified within included portions of the abdomen/retroperitoneum. Paraspinal soft tissues within normal limits. Disc levels: Intervertebral disc height is preserved. There is multilevel mild to moderate facet hypertrophy. Facet hypertrophy results in mild left L4-L5 subarticular narrowing without nerve root impingement. No  significant disc herniation, central canal stenosis or neural foraminal narrowing at any level. IMPRESSION: 1.5 cm T12 vertebral body lesion likely reflecting and osseous metastasis. Numerous additional sclerotic metastases within the lower thoracic and lumbar spine as well as bony pelvis were better appreciated on CT abdomen/pelvis 01/18/2019. No compression fracture. Mild lumbar spondylosis predominantly consisting of facet arthropathy. Mild L4-L5 left subarticular narrowing without nerve root impingement. No significant central canal stenosis or neural foraminal narrowing at any level. Diffuse abnormal T1 hypointense marrow signal which is nonspecific, but likely reflects red marrow reconversion given provided history. Multiple partially imaged liver metastases. Electronically Signed   By: Kellie Simmering DO   On: 01/28/2019 14:40   CT Abdomen Pelvis W Contrast  Result Date: 02/10/2019 CLINICAL DATA:  History of breast cancer. Shortness of breath for 2 days. Patient status post right mastectomy 01/23/2019 EXAM: CT ANGIOGRAPHY CHEST CT ABDOMEN AND PELVIS WITH CONTRAST TECHNIQUE: Multidetector CT imaging of the chest was performed using the standard protocol during bolus administration of intravenous contrast. Multiplanar CT image reconstructions and MIPs were obtained to evaluate the vascular anatomy. Multidetector CT imaging of the abdomen and pelvis was performed using the standard protocol during bolus administration of intravenous contrast. CONTRAST:  75 mL OMNIPAQUE IOHEXOL 350 MG/ML SOLN COMPARISON:  CT angiogram of the chest 01/28/2019. CT abdomen and pelvis 01/18/2019. FINDINGS: CTA CHEST FINDINGS Cardiovascular: No pulmonary embolus is seen. Heart size is normal. No pericardial effusion. Mediastinum/Nodes: Mediastinal and hilar lymphadenopathy is again seen. 1.6 cm node in the right hilum on image 45 is identified. An AP window node on image 38 measures 1.1 cm short axis dimension. The appearance is  unchanged. The esophagus is unremarkable. The thyroid gland appears normal. Lungs/Pleura: Moderately large right pleural effusion has slightly increased since the prior CT. Scattered areas of ground-glass attenuation throughout both lungs are new since the prior CT. Multiple pulmonary nodules are again identified. A left lower lobe nodule measuring 0.8 cm on image 67 measured 0.6 cm on the prior examination. A 0.6 cm left lower lobe nodule  on the prior examination measures 0.7 cm on image 62, series 4 today. A 0.5 cm left upper lobe nodule on image 33 is new since the prior CT. Compressive atelectasis on the right is noted. Musculoskeletal: Soft tissue gas in the right chest wall from prior mastectomy has resolved since the prior study. There is soft tissue edema and stranding in the right chest wall likely due to postoperative change. A drain is in place. Multiple sclerotic lesions are seen in scattered bones are consistent with metastatic disease. Review of the MIP images confirms the above findings. CT ABDOMEN and PELVIS FINDINGS Hepatobiliary: Again seen are multiple metastatic deposits in the liver including a 2.3 cm lesion in the posterior right hepatic lobe on image 18 which measured 2.1 cm on the prior exam. The gallbladder is mildly distended. Biliary tree is unremarkable. Pancreas: Unremarkable. No pancreatic ductal dilatation or surrounding inflammatory changes. Spleen: Normal in size without focal abnormality. Adrenals/Urinary Tract: Adrenal glands are unremarkable. Kidneys are normal, without renal calculi, focal lesion, or hydronephrosis. Bladder is unremarkable. Stomach/Bowel: Stomach is within normal limits. Appendix appears normal. No evidence of bowel wall thickening, distention, or inflammatory changes. Vascular/Lymphatic: No significant vascular findings are present. No enlarged abdominal or pelvic lymph nodes. Reproductive: Uterus and bilateral adnexa are unremarkable. Other: There is a small  volume of free pelvic fluid which is new since the prior exam. Musculoskeletal: Multiple sclerotic bone lesions consistent with metastatic disease are again seen. Review of the MIP images confirms the above findings. IMPRESSION: Negative for pulmonary embolus. Scattered ground-glass attenuation in both lungs is worrisome for pneumonia, including atypical/viral infection. Moderately large right pleural effusion has increased since the prior chest CT. New pulmonary nodules and increase in the size previously seen nodules consistent with progressive metastatic disease. Additional metastatic disease with lymphadenopathy in the chest, liver lesions and skeletal lesions noted. Small volume of free pelvic fluid is new since the prior abdomen and pelvis CT scan and may be due to volume overload. Postoperative change right chest wall. Surgical drain remains in place. Electronically Signed   By: Inge Rise M.D.   On: 02/10/2019 12:27   DG Chest Portable 1 View  Result Date: 02/17/2019 CLINICAL DATA:  Intubation.  Acute respiratory failure. EXAM: PORTABLE CHEST 1 VIEW COMPARISON:  02/17/2019 FINDINGS: The endotracheal tube is 3.3 cm above the carina. The NG tube is coursing down the esophagus and into the stomach. New right IJ central venous catheter tip is in the distal SVC. Stable left-sided Port-A-Cath with its tip in the right atrium. Persistent diffuse interstitial and airspace process in the lungs along with a right-sided pleural effusion. Slight improved lung aeration status post intubation. No pneumothorax. IMPRESSION: 1. The endotracheal tube, NG tube and right IJ central venous catheter are in good position without complicating features. 2. Diffuse interstitial and airspace process and right pleural effusion. Overall slight improved aeration after intubation. Electronically Signed   By: Marijo Sanes M.D.   On: 02/17/2019 12:34   DG Chest Port 1 View  Result Date: 02/17/2019 CLINICAL DATA:  Acute  respiratory failure. COVID-19. Difficulty breathing. EXAM: PORTABLE CHEST 1 VIEW COMPARISON:  Radiograph yesterday. CT 02/10/2019 FINDINGS: Right pleural effusion which may have increased from yesterday. Worsening in diffuse patchy and interstitial opacities. Development of mild pulmonary edema with Kerley B-lines. Normal heart size and mediastinal contours. No pneumothorax. Post right mastectomy. Probable drain in place. Surgical clips in the right axilla. IMPRESSION: 1. Right pleural effusion which is increased from yesterday. 2.  Increased interstitial opacities and Kerley B-lines consistent with pulmonary edema. Additional patchy opacities throughout both lungs may represent pneumonia in the setting of COVID-19. Electronically Signed   By: Keith Rake M.D.   On: 02/17/2019 05:05   DG Chest Port 1 View  Result Date: 02/18/2019 CLINICAL DATA:  Shortness of breath. COVID-19 positive. EXAM: PORTABLE CHEST 1 VIEW COMPARISON:  February 10, 2019. FINDINGS: The heart size and mediastinal contours are within normal limits. Left subclavian Port-A-Cath is unchanged in position. No pneumothorax pleural effusion is noted. Stable bilateral diffuse interstitial densities are noted. The visualized skeletal structures are unremarkable. IMPRESSION: Stable bilateral diffuse interstitial densities are noted concerning for edema or inflammation. Electronically Signed   By: Marijo Conception M.D.   On: 02/09/2019 10:56   DG Chest Portable 1 View  Result Date: 02/10/2019 CLINICAL DATA:  Short of breath.  History of breast cancer. EXAM: PORTABLE CHEST 1 VIEW COMPARISON:  Chest x-ray 01/27/2019.  CT chest 01/28/2019 FINDINGS: Heart size normal. Diffusely increased interstitial markings with mild progression since the prior study. Improvement in right pleural effusion. Heart size normal.  Mild mediastinal adenopathy best seen on CT Port-A-Cath tip in the right atrium unchanged. Soft tissue drain in the right breast. IMPRESSION:  Diffuse interstitial markings have progressed in the interval and may be due to infection or edema. Improvement in right pleural effusion. Electronically Signed   By: Franchot Gallo M.D.   On: 02/10/2019 08:49    Microbiology Recent Results (from the past 240 hour(s))  Blood Culture (routine x 2)     Status: None (Preliminary result)   Collection Time: 02/17/2019  9:47 AM   Specimen: BLOOD  Result Value Ref Range Status   Specimen Description BLOOD RIGHT ANTECUBITAL  Final   Special Requests   Final    BOTTLES DRAWN AEROBIC AND ANAEROBIC Blood Culture adequate volume   Culture   Final    NO GROWTH 3 DAYS Performed at [facility], [address] [place], [place] [address]    Report Status PENDING  Incomplete  Blood Culture (routine x 2)     Status: None (Preliminary result)   Collection Time: 02/18/2019  9:47 AM   Specimen: BLOOD  Result Value Ref Range Status   Specimen Description BLOOD LAC  Final   Special Requests   Final    BOTTLES DRAWN AEROBIC AND ANAEROBIC Blood Culture adequate volume   Culture   Final    NO GROWTH 3 DAYS Performed at [facility], [address] [place], [place] [address]    Report Status PENDING  Incomplete  Wound or Superficial Culture     Status: None   Collection Time: 02/06/2019 10:19 AM   Specimen: Chest; Wound  Result Value Ref Range Status   Specimen Description   Final    CHEST Performed at [facility], [address], [place], [place] [address]    Special Requests   Final    Normal Performed at [facility], [address] [place], [place] [address]    Gram Stain   Final    NO WBC SEEN MODERATE GRAM POSITIVE COCCI IN PAIRS Performed at [facility], [place] [address] [place], [place] [address]    Culture ABUNDANT STAPHYLOCOCCUS AUREUS  Final   Report Status 02/18/2019 FINAL  Final   Organism ID, Bacteria STAPHYLOCOCCUS AUREUS  Final      Susceptibility   Staphylococcus aureus - MIC*     CIPROFLOXACIN >=8 RESISTANT  Resistant     ERYTHROMYCIN <=0.25 SENSITIVE Sensitive     GENTAMICIN <=0.5 SENSITIVE Sensitive     OXACILLIN <=0.25 SENSITIVE Sensitive     TETRACYCLINE <=1 SENSITIVE Sensitive     VANCOMYCIN <=0.5 SENSITIVE Sensitive     TRIMETH/SULFA <=10 SENSITIVE Sensitive     CLINDAMYCIN <=0.25 SENSITIVE Sensitive     RIFAMPIN <=0.5 SENSITIVE Sensitive     Inducible Clindamycin NEGATIVE Sensitive     * ABUNDANT STAPHYLOCOCCUS AUREUS  Urine Culture     Status: Abnormal   Collection Time: 02/17/19 10:12 AM   Specimen: Urine, Random  Result Value Ref Range Status   Specimen Description   Final    URINE, RANDOM Performed at [facility], [address], [place], [place] [address]    Special Requests   Final    NONE Performed at [facility], [address], [place], [place] [address]    Culture (A)  Final    <10,000 COLONIES/mL INSIGNIFICANT GROWTH Performed at [facility], [place] [address], [place], [place] [address]    Report Status 02/18/2019 FINAL  Final  MRSA PCR Screening     Status: None   Collection Time: 02/17/19  2:40 PM   Specimen: Nasal Mucosa; Nasopharyngeal  Result Value Ref Range Status   MRSA by PCR NEGATIVE NEGATIVE Final    Comment:        The GeneXpert MRSA Assay (FDA approved for NASAL specimens only), is one component of a comprehensive MRSA colonization surveillance program. It is not intended to diagnose MRSA infection nor to guide or monitor treatment for MRSA infections. Performed at [facility], [address] [place], [place] [address]     Lab Basic Metabolic Panel: Recent Labs  Lab 02/25/2019 1004 02/17/19 0450 02/17/19 1430 02/18/19 0421 02/15/2019 0301  NA 131* 135 138 138 136  K 4.9 4.8 4.7 4.7 5.3*  CL 93* 104 104 99 94*  CO2 9* 9* 11* 15* 13*  GLUCOSE 66* 82 176* 103* 196*  BUN 14 14 20  32* 41*  CREATININE 0.66 0.46 0.65 1.09* 2.07*  CALCIUM 10.0 8.8* 8.8* 8.5* 7.1*  MG  --   1.9  --  2.0 2.0  PHOS  --  1.8* 3.8 2.3* 6.1*   Liver Function Tests: Recent Labs  Lab 02/28/2019 1004 02/17/19 0450 02/18/19 0421 02/06/2019 0301  AST 319* 265* 529* 4,019*  ALT 199* 160* 138* 383*  ALKPHOS 1,368* 1,222* 1,549* 2,215*  BILITOT 1.1 0.8 1.0 1.9*  PROT 6.7 5.5* 4.2* 3.5*  ALBUMIN 2.4* 2.0* 1.4* 1.2*   Recent Labs  Lab 02/15/2019 1004  LIPASE 15   No results for input(s): AMMONIA in the last 168 hours. CBC: Recent Labs  Lab 02/25/2019 1004 02/17/19 0450 02/18/19 0421 02/17/2019 0301  WBC 18.4* 14.6* 12.7* 9.0  NEUTROABS 11.8* 9.8* 8.9* 5.8  HGB 9.8* 8.2* 7.4* 7.1*  HCT 32.2* 26.6* 22.7* 22.0*  MCV 84.5 83.1 79.4* 79.4*  PLT 121* 82* 55* 30*   Cardiac Enzymes: Recent Labs  Lab 02/26/2019 1004  CKTOTAL 12*   Sepsis Labs: Recent Labs  Lab 03/02/2019 1004 02/27/2019 1737 02/17/19 0156 02/17/19 0450 02/18/19 0421 02/18/19 1621 02/25/2019 0301  PROCALCITON 4.91  --   --  6.27 19.97  --  31.97  WBC 18.4*  --   --  14.6* 12.7*  --  9.0  LATICACIDVEN  --  >11.0* >11.0* >11.0*  --  >11.0*  --     Procedures/Operations  12/14-  Endotracheal intubation 12/14- Right IJ CVC insertion     Darel Hong, [facility] Pager: [phone]  Bradly Bienenstock 03/01/2019, 8:16 PM

## 2019-03-07 DEATH — deceased

## 2019-03-10 ENCOUNTER — Telehealth: Payer: Self-pay | Admitting: *Deleted

## 2019-03-10 NOTE — Telephone Encounter (Signed)
Mother had dropped my a policy about death benefits and what kind the diagnosis is the patient has had since we have seen her.  I filled out the information Dr. Judithann Graves signed and on December 31 I called the mother today and left her a voicemail stating that the paper was done I can fax it in for her she can come pick it up I gave her my direct phone number to call me back

## 2019-05-01 ENCOUNTER — Encounter: Payer: Medicaid Other | Admitting: Licensed Clinical Social Worker

## 2021-04-26 IMAGING — MG MM BREAST LOCALIZATION CLIP RIGHT
4 series · 4 of 12 positions shown · non-contrast
Comparison: Previous exam(s).

CLINICAL DATA: Post ultrasound-guided core needle biopsy of right
breast 5:30 o'clock mass, and an abnormal right axillary lymph node.

EXAM:
DIAGNOSTIC RIGHT MAMMOGRAM POST ULTRASOUND BIOPSY

[R CC synth-2D]
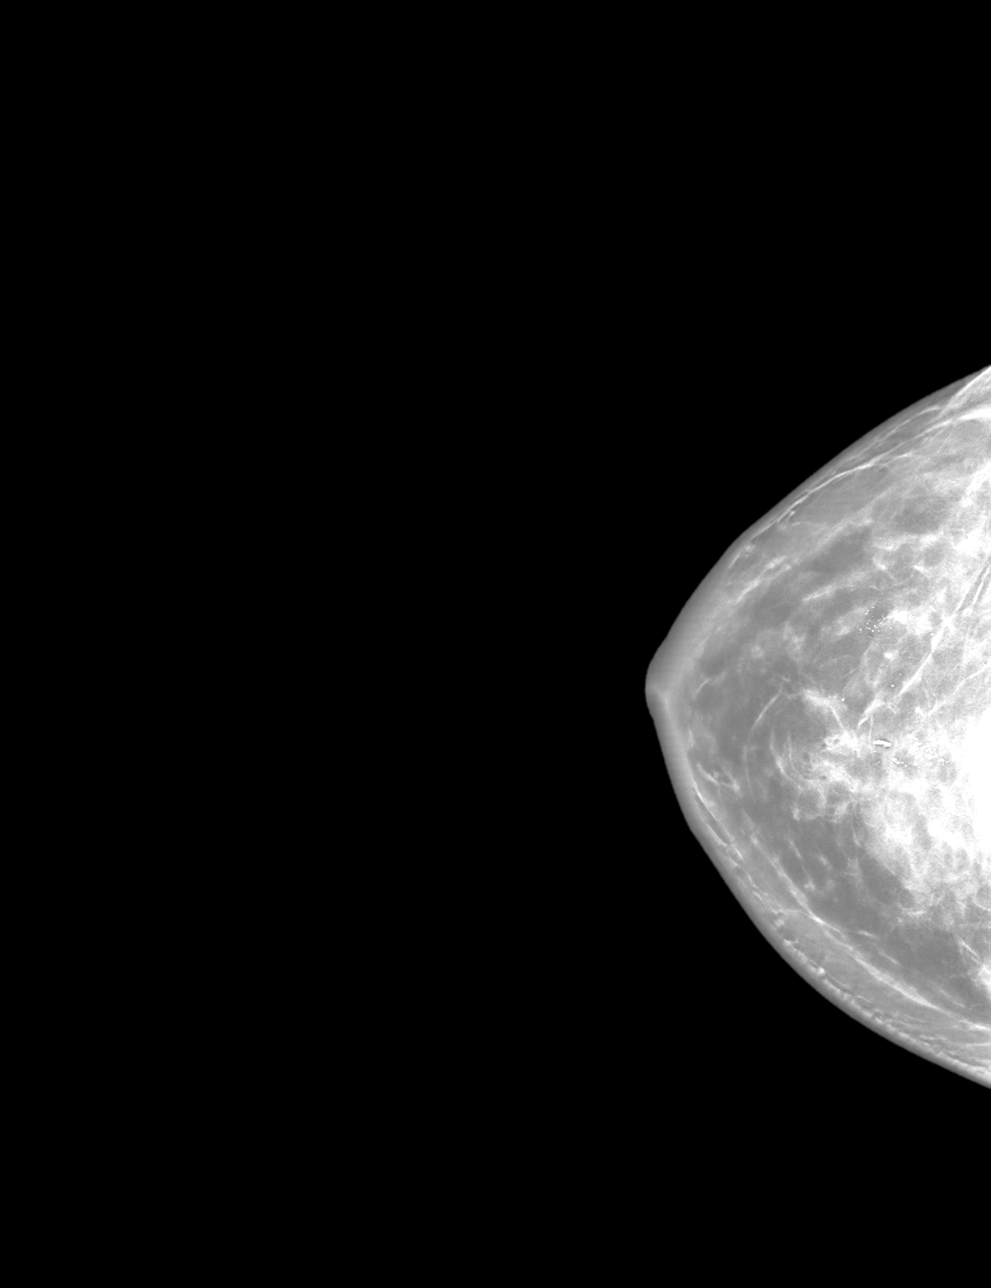

[R ML synth-2D]
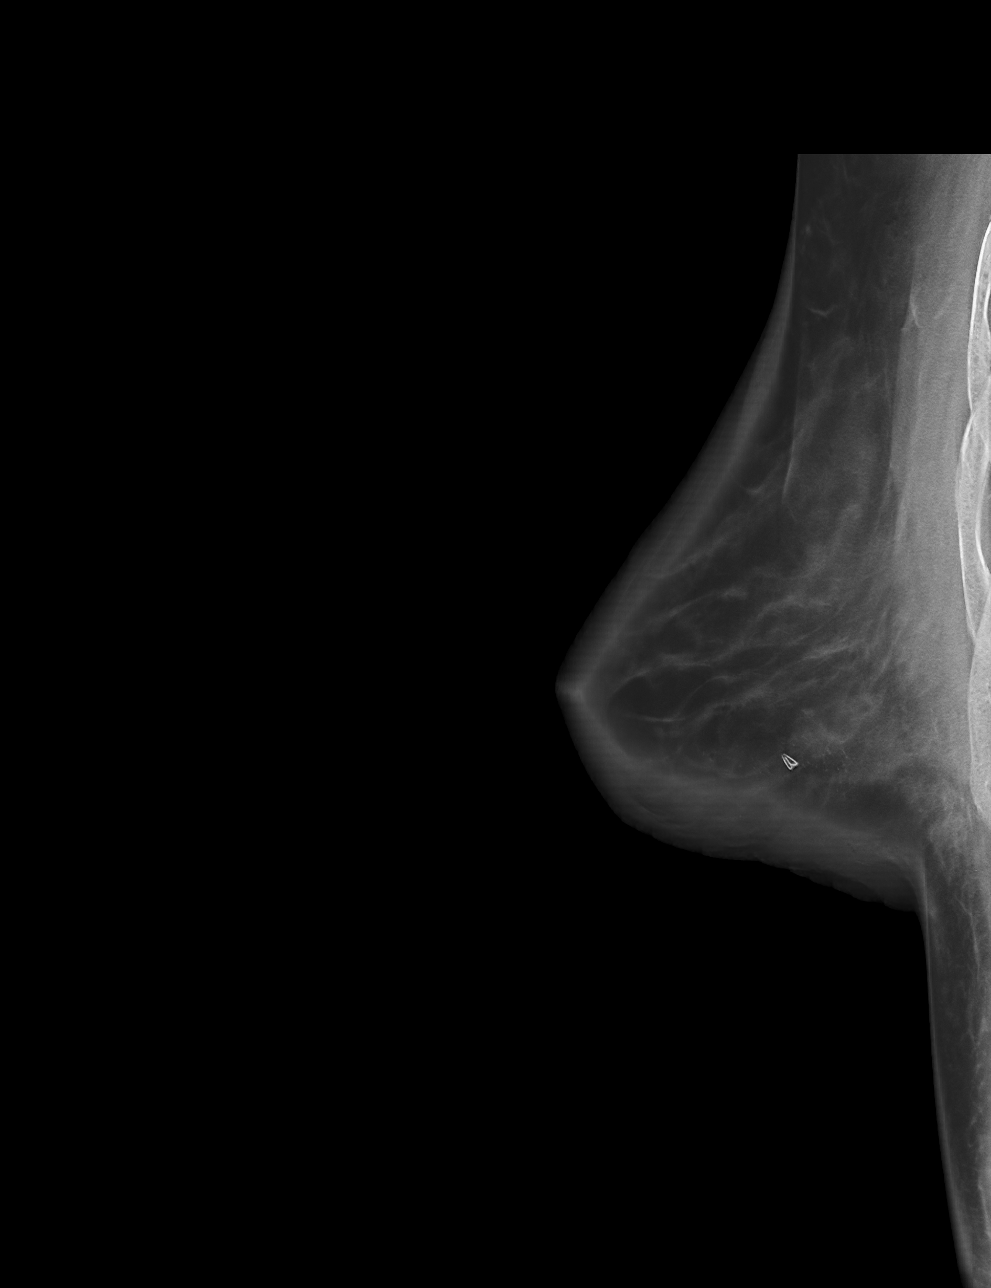

[R CC tomo · tomo slice 45/89.0]
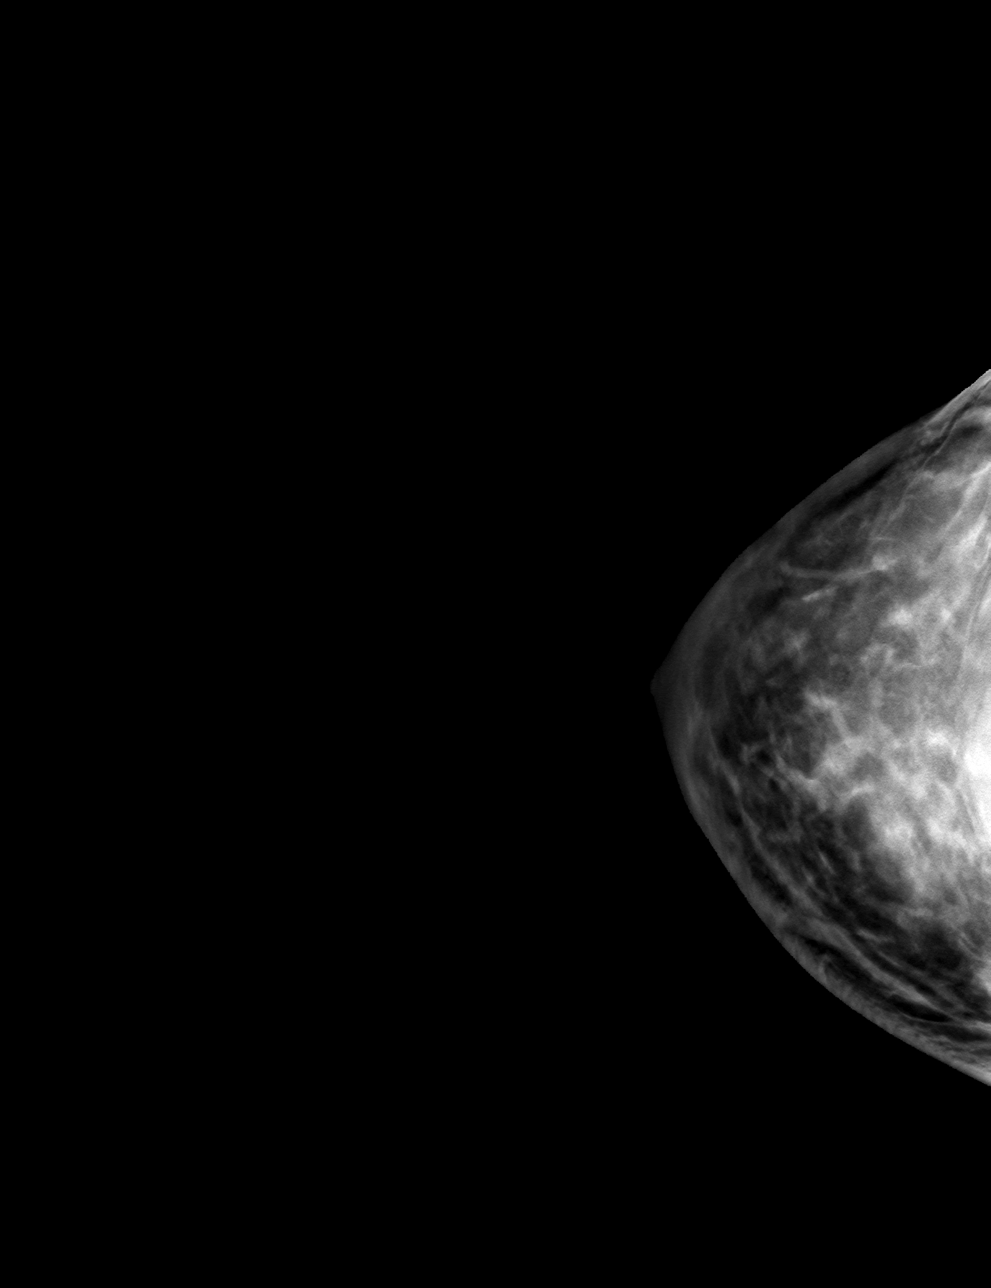

[R ML tomo · tomo slice 67/132.0]
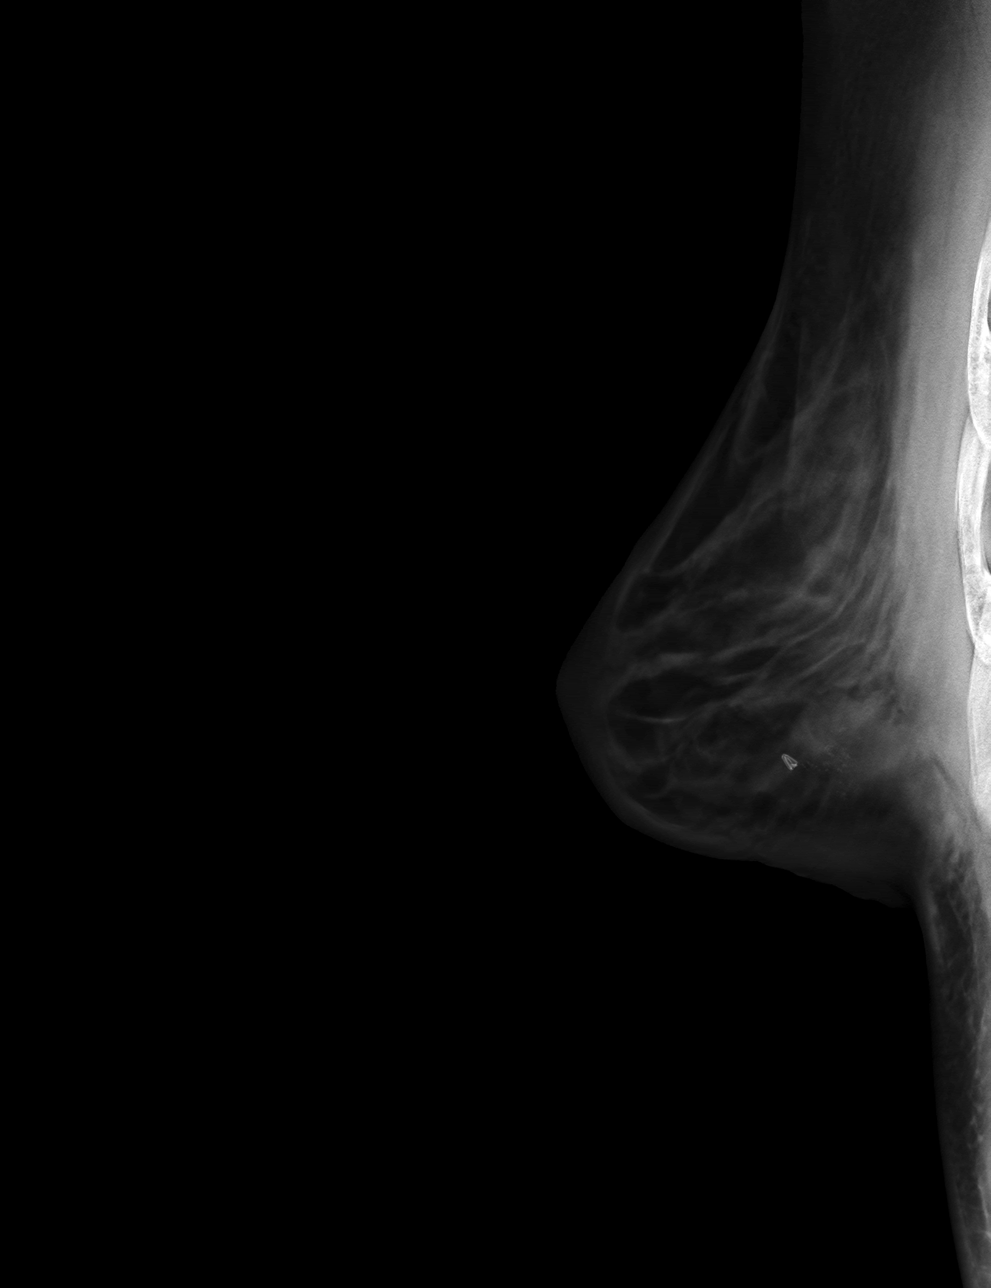

[4 of 12 positions shown; findings below may reference images not displayed]

FINDINGS: Mammographic images were obtained following ultrasound guided biopsy
of right breast and right axilla. Two-view mammography demonstrates
presence of heart shaped marker within the right breast mass of
question, and HydroMARK within 1 of the abnormal right axillary
lymph nodes.
IMPRESSION: Successful placement of heart shaped marker within the right breast.

Successful placement of HydroMARK within an abnormal right axillary
lymph node.

Final Assessment: Post Procedure Mammograms for Marker Placement

## 2021-05-07 IMAGING — DX DG CHEST 1V
1 series · 1 of 1 positions shown · non-contrast
Comparison: None.

CLINICAL DATA: Breast cancer.  Port insertion.

EXAM:
CHEST  1 VIEW

[chest ap]
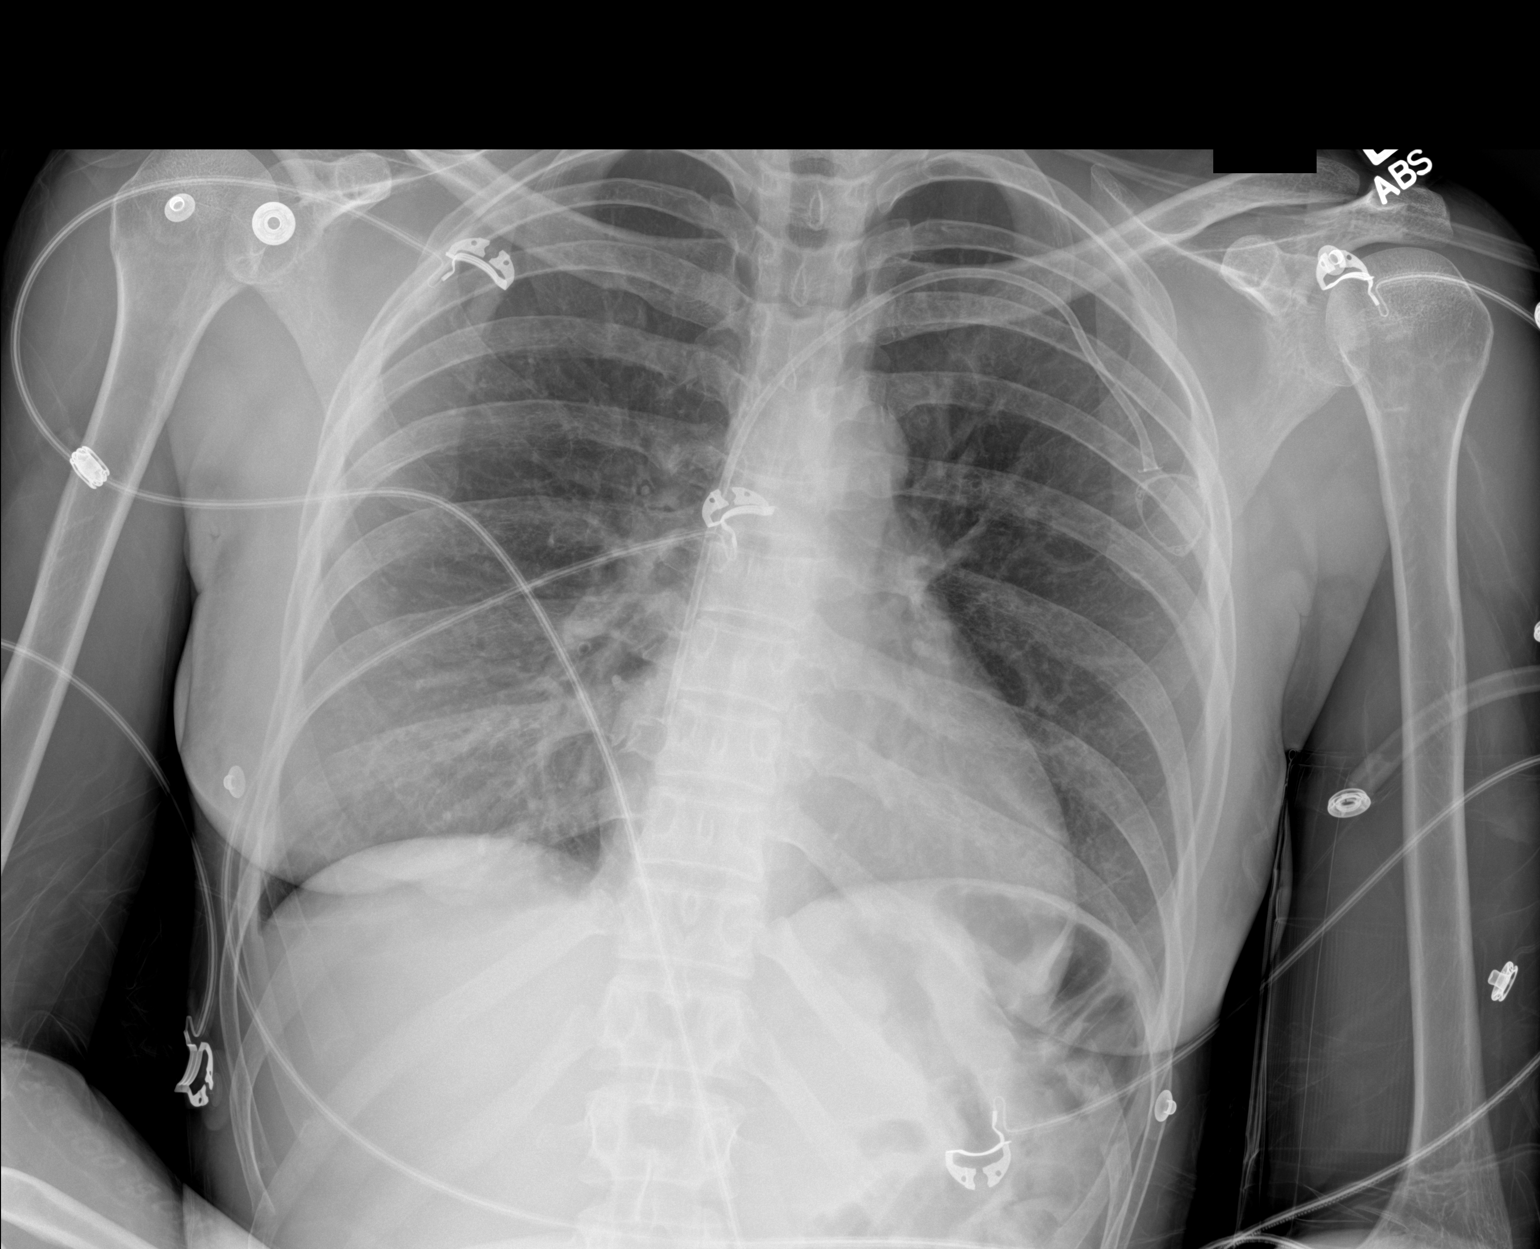

[1 of 1 positions shown; findings below may reference images not displayed]

FINDINGS: Heart size is normal. Power port inserted on the left via a
subclavian approach with the tip at the SVC RA junction. No
pneumothorax. Lungs remain clear. No bone abnormality.
IMPRESSION: Power port tip at the SVC RA junction. No pneumothorax. No active
disease.
